# Patient Record
Sex: Female | Born: 1980
Health system: Southern US, Community
[De-identification: ages and names within clinical notes are randomized; demographics above are authoritative.]

## PROBLEM LIST (undated history)

## (undated) ENCOUNTER — Inpatient Hospital Stay (HOSPITAL_COMMUNITY): Payer: Self-pay

## (undated) DIAGNOSIS — K219 Gastro-esophageal reflux disease without esophagitis: Secondary | ICD-10-CM

## (undated) DIAGNOSIS — T8859XA Other complications of anesthesia, initial encounter: Secondary | ICD-10-CM

## (undated) DIAGNOSIS — Z87440 Personal history of urinary (tract) infections: Secondary | ICD-10-CM

## (undated) DIAGNOSIS — T4145XA Adverse effect of unspecified anesthetic, initial encounter: Secondary | ICD-10-CM

## (undated) DIAGNOSIS — I10 Essential (primary) hypertension: Secondary | ICD-10-CM

## (undated) DIAGNOSIS — Z8759 Personal history of other complications of pregnancy, childbirth and the puerperium: Secondary | ICD-10-CM

## (undated) DIAGNOSIS — K649 Unspecified hemorrhoids: Secondary | ICD-10-CM

## (undated) HISTORY — DX: Essential (primary) hypertension: I10

## (undated) HISTORY — DX: Unspecified hemorrhoids: K64.9

## (undated) HISTORY — DX: Personal history of urinary (tract) infections: Z87.440

## (undated) HISTORY — DX: Personal history of other complications of pregnancy, childbirth and the puerperium: Z87.59

---

## 2009-08-25 ENCOUNTER — Emergency Department (HOSPITAL_COMMUNITY): Admission: EM | Admit: 2009-08-25 | Discharge: 2009-08-25 | Payer: Self-pay | Admitting: Emergency Medicine

## 2010-05-30 ENCOUNTER — Emergency Department (HOSPITAL_COMMUNITY)
Admission: EM | Admit: 2010-05-30 | Discharge: 2010-05-31 | Payer: Self-pay | Source: Home / Self Care | Admitting: Emergency Medicine

## 2010-10-17 ENCOUNTER — Other Ambulatory Visit (HOSPITAL_COMMUNITY): Payer: Self-pay | Admitting: Obstetrics & Gynecology

## 2010-10-17 DIAGNOSIS — N979 Female infertility, unspecified: Secondary | ICD-10-CM

## 2010-10-24 ENCOUNTER — Ambulatory Visit (HOSPITAL_COMMUNITY)
Admission: RE | Admit: 2010-10-24 | Discharge: 2010-10-24 | Disposition: A | Discharge status: Home / Self Care | Payer: PRIVATE HEALTH INSURANCE | Source: Ambulatory Visit | Attending: Obstetrics & Gynecology | Admitting: Obstetrics & Gynecology

## 2010-10-24 DIAGNOSIS — N979 Female infertility, unspecified: Secondary | ICD-10-CM | POA: Insufficient documentation

## 2010-11-09 LAB — CBC
MCH: 28.2 pg (ref 26.0–34.0)
MCHC: 33.2 g/dL (ref 30.0–36.0)
Platelets: 325 10*3/uL (ref 150–400)
RBC: 4.57 MIL/uL (ref 3.87–5.11)

## 2010-11-09 LAB — DIFFERENTIAL
Basophils Relative: 0 % (ref 0–1)
Eosinophils Absolute: 0.2 10*3/uL (ref 0.0–0.7)
Lymphs Abs: 4.6 10*3/uL — ABNORMAL HIGH (ref 0.7–4.0)
Neutro Abs: 6.4 10*3/uL (ref 1.7–7.7)
Neutrophils Relative %: 54 % (ref 43–77)

## 2010-11-09 LAB — POCT I-STAT, CHEM 8
Chloride: 105 mEq/L (ref 96–112)
HCT: 42 % (ref 36.0–46.0)
Hemoglobin: 14.3 g/dL (ref 12.0–15.0)
Potassium: 4.1 mEq/L (ref 3.5–5.1)
Sodium: 138 mEq/L (ref 135–145)

## 2010-11-09 LAB — POCT PREGNANCY, URINE: Preg Test, Ur: NEGATIVE

## 2010-11-27 LAB — POCT I-STAT, CHEM 8
HCT: 39 % (ref 36.0–46.0)
Hemoglobin: 13.3 g/dL (ref 12.0–15.0)
Potassium: 4 mEq/L (ref 3.5–5.1)
Sodium: 137 mEq/L (ref 135–145)

## 2011-05-30 ENCOUNTER — Emergency Department (HOSPITAL_COMMUNITY)
Admission: EM | Admit: 2011-05-30 | Discharge: 2011-05-30 | Discharge status: Against Medical Advice | Payer: PRIVATE HEALTH INSURANCE | Attending: Emergency Medicine | Admitting: Emergency Medicine

## 2011-05-30 DIAGNOSIS — O209 Hemorrhage in early pregnancy, unspecified: Secondary | ICD-10-CM | POA: Insufficient documentation

## 2011-05-30 DIAGNOSIS — R109 Unspecified abdominal pain: Secondary | ICD-10-CM | POA: Insufficient documentation

## 2011-06-06 ENCOUNTER — Inpatient Hospital Stay (HOSPITAL_COMMUNITY)
Admission: AD | Admit: 2011-06-06 | Discharge: 2011-06-06 | Disposition: A | Discharge status: Home / Self Care | Payer: PRIVATE HEALTH INSURANCE | Source: Ambulatory Visit | Attending: Family Medicine | Admitting: Family Medicine

## 2011-06-06 ENCOUNTER — Inpatient Hospital Stay (HOSPITAL_COMMUNITY): Payer: PRIVATE HEALTH INSURANCE

## 2011-06-06 ENCOUNTER — Encounter (HOSPITAL_COMMUNITY): Payer: Self-pay

## 2011-06-06 DIAGNOSIS — O209 Hemorrhage in early pregnancy, unspecified: Secondary | ICD-10-CM | POA: Insufficient documentation

## 2011-06-06 DIAGNOSIS — O26859 Spotting complicating pregnancy, unspecified trimester: Secondary | ICD-10-CM

## 2011-06-06 LAB — URINALYSIS, ROUTINE W REFLEX MICROSCOPIC
Bilirubin Urine: NEGATIVE
Leukocytes, UA: NEGATIVE
Nitrite: NEGATIVE
Specific Gravity, Urine: 1.015 (ref 1.005–1.030)
Urobilinogen, UA: 0.2 mg/dL (ref 0.0–1.0)

## 2011-06-06 LAB — CBC
MCH: 27.2 pg (ref 26.0–34.0)
Platelets: 289 10*3/uL (ref 150–400)
RBC: 4.34 MIL/uL (ref 3.87–5.11)
WBC: 10.2 10*3/uL (ref 4.0–10.5)

## 2011-06-06 LAB — URINE MICROSCOPIC-ADD ON: WBC, UA: NONE SEEN WBC/hpf (ref <3)

## 2011-06-06 LAB — ABO/RH: ABO/RH(D): O POS

## 2011-06-06 LAB — WET PREP, GENITAL: Trich, Wet Prep: NONE SEEN

## 2011-06-06 NOTE — Progress Notes (Signed)
RN called pt back for evaluation. Pt not in lobby at that time.

## 2011-06-06 NOTE — ED Provider Notes (Signed)
Colleen Terrell [id number] y.o.G2P1001 @[redacted]w[redacted]d  Chief Complaint  Patient presents with  . Vaginal Bleeding    SUBJECTIVE  HPI: Vaginal spotting intermittently x 10 days. Today passed small clots several hours prior to arrival. No active bleeding at present. Mild groin pain when walking. Denies urinary symptoms or irritative vaginitis and states bleeding was not postcoital.  No past medical history on file.  Ob Hx: uncomplicated NSVD Gyn Hx: neg STIs  No past surgical history on file. History   Social History  . Marital Status: Married    Spouse Name: N/A    Number of Children: N/A  . Years of Education: N/A   Occupational History  . Not on file.   Social History Main Topics  . Smoking status: Never Smoker   . Smokeless tobacco: Not on file  . Alcohol Use: No  . Drug Use: No  . Sexually Active:    Other Topics Concern  . Not on file   Social History Narrative  . No narrative on file   No current facility-administered medications on file prior to encounter.   No current outpatient prescriptions on file prior to encounter.   No Known Allergies  ROS: Pertinent items in HPI  OBJECTIVE  BP 126/71  Pulse 89  Temp(Src) 99.3 F (37.4 C) (Oral)  Resp 20  Ht 5' 9.5" (1.765 m)  Wt 112.946 kg (249 lb)  BMI 36.24 kg/m2  SpO2 97%  LMP 04/20/2011   Physical Exam:  General: WN/WD in NAD XBJ:YNWG, nontender Pelvic: NEFG; vag with scant brown blood, no active bleeding from os; Cx no lesions;  Bimanual: fishmouth os, ? Old lac, cx L/C, uterus c/w 7 wks; adnexae no mass Back:neg CVAT Ext: No edema US Ob Comp Less 14 Wks  06/06/2011  *RADIOLOGY REPORT*  Clinical Data: Early pregnancy with bleeding  OBSTETRIC <14 WK [place] AND TRANSVAGINAL OB US  Technique:  Both transabdominal and transvaginal ultrasound examinations were performed for complete evaluation of the gestation as well as the maternal uterus, adnexal regions, and pelvic cul-de-sac.  Transvaginal technique was  performed to assess early pregnancy.  Comparison:  None  Intrauterine gestational sac:  Single sac present, normal in shape. Yolk sac: Present Embryo: Present Cardiac Activity: Present Heart Rate: 125 bpm  CRL: 10   mm  seven   w  one   d          [place] EDC: 01/25/2012  Maternal uterus/adnexae: No uterine abnormality is seen.  Both ovaries appear normal with the right measuring 2.9 by 3.0 x 1.6 cm and the left measuring 1.6 x 3.3 x 1.8 cm.  No free fluid.  IMPRESSION: Normal appearing single living intrauterine gestation at 7 weeks 1 day by crown-rump length.  No complicating feature evident.  Original Report Authenticated By: Thomasenia Sales, M.D.   Lab: WP neg, GC/CT done  ASSESSMENT  Early pregnancy bleeding Viable IUP [redacted]w[redacted]d   PLAN  Start PNV, [facility]

## 2011-06-06 NOTE — Progress Notes (Signed)
Pt states that she has some spotting on and off for about 10 days. Passed a small clot at 1500, no active bleeding. Has pain on both her sides when standing.

## 2011-06-07 LAB — GC/CHLAMYDIA PROBE AMP, GENITAL
Chlamydia, DNA Probe: NEGATIVE
GC Probe Amp, Genital: NEGATIVE

## 2011-08-01 LAB — OB RESULTS CONSOLE ANTIBODY SCREEN: Antibody Screen: NEGATIVE

## 2011-08-01 LAB — OB RESULTS CONSOLE ABO/RH

## 2011-08-01 LAB — OB RESULTS CONSOLE HEPATITIS B SURFACE ANTIGEN: Hepatitis B Surface Ag: NEGATIVE

## 2011-08-01 LAB — OB RESULTS CONSOLE RUBELLA ANTIBODY, IGM: Rubella: IMMUNE

## 2011-08-28 NOTE — L&D Delivery Note (Signed)
Delivery Note At 6:33 AM a viable and healthy female was delivered via Vaginal, Spontaneous Delivery (Presentation: Left Occiput Anterior).  APGAR: 8, 8; weight 9 lb 1.5 oz (4125 g).   Placenta status: Intact, Spontaneous.  Sent , marginal insertion Cord: 3 vessels with the following complications: None.  Cord pH: none  Anesthesia: Local  Episiotomy: MedianI( through previous scar due to left labia minora tearing Lacerations: 3rd degree;Labial left minora( entire) medial Suture Repair: 2.0 3.0 chromic vicryl vicryl rapide Est. Blood Loss (mL): 350  Mom to postpartum.  Baby to nursery-stable.  Lakin Romer A 02/02/2012, 8:50 AM

## 2012-01-31 ENCOUNTER — Other Ambulatory Visit: Payer: Self-pay | Admitting: Obstetrics & Gynecology

## 2012-02-01 ENCOUNTER — Encounter (HOSPITAL_COMMUNITY): Payer: Self-pay | Admitting: *Deleted

## 2012-02-01 ENCOUNTER — Telehealth (HOSPITAL_COMMUNITY): Payer: Self-pay | Admitting: *Deleted

## 2012-02-01 NOTE — Telephone Encounter (Signed)
Preadmission screen  

## 2012-02-02 ENCOUNTER — Encounter (HOSPITAL_COMMUNITY): Payer: Self-pay

## 2012-02-02 ENCOUNTER — Inpatient Hospital Stay (HOSPITAL_COMMUNITY)
Admission: AD | Admit: 2012-02-02 | Discharge: 2012-02-04 | DRG: 775 | Disposition: A | Discharge status: Home / Self Care | Payer: PRIVATE HEALTH INSURANCE | Source: Ambulatory Visit | Attending: Obstetrics and Gynecology | Admitting: Obstetrics and Gynecology

## 2012-02-02 DIAGNOSIS — O878 Other venous complications in the puerperium: Secondary | ICD-10-CM | POA: Diagnosis present

## 2012-02-02 DIAGNOSIS — Z23 Encounter for immunization: Secondary | ICD-10-CM

## 2012-02-02 DIAGNOSIS — O480 Post-term pregnancy: Secondary | ICD-10-CM | POA: Diagnosis present

## 2012-02-02 DIAGNOSIS — Z22330 Carrier of Group B streptococcus: Secondary | ICD-10-CM

## 2012-02-02 DIAGNOSIS — K649 Unspecified hemorrhoids: Secondary | ICD-10-CM | POA: Diagnosis present

## 2012-02-02 DIAGNOSIS — O99892 Other specified diseases and conditions complicating childbirth: Secondary | ICD-10-CM | POA: Diagnosis present

## 2012-02-02 LAB — CBC
HCT: 39.4 % (ref 36.0–46.0)
Hemoglobin: 13.1 g/dL (ref 12.0–15.0)
MCHC: 33.2 g/dL (ref 30.0–36.0)
MCV: 87.8 fL (ref 78.0–100.0)
RDW: 13.9 % (ref 11.5–15.5)

## 2012-02-02 MED ORDER — LANOLIN HYDROUS EX OINT: TOPICAL_OINTMENT | CUTANEOUS | Status: DC | PRN

## 2012-02-02 MED ORDER — LACTATED RINGERS IV SOLN
INTRAVENOUS | Status: DC
  Administered 2012-02-02: 05:00:00 via INTRAVENOUS

## 2012-02-02 MED ORDER — ONDANSETRON HCL 4 MG/2ML IJ SOLN: 4.0000 mg | INTRAMUSCULAR | Status: DC | PRN

## 2012-02-02 MED ORDER — PRENATAL MULTIVITAMIN CH: 1.0000 | ORAL_TABLET | Freq: Every day | ORAL | Status: DC

## 2012-02-02 MED ORDER — ONDANSETRON HCL 4 MG/2ML IJ SOLN: 4.0000 mg | Freq: Four times a day (QID) | INTRAMUSCULAR | Status: DC | PRN

## 2012-02-02 MED ORDER — TETANUS-DIPHTH-ACELL PERTUSSIS 5-2.5-18.5 LF-MCG/0.5 IM SUSP
0.5000 mL | Freq: Once | INTRAMUSCULAR | Status: AC
  Administered 2012-02-03: 0.5 mL via INTRAMUSCULAR
  Filled 2012-02-02: qty 0.5

## 2012-02-02 MED ORDER — NYSTATIN-TRIAMCINOLONE 100000-0.1 UNIT/GM-% EX CREA
TOPICAL_CREAM | Freq: Two times a day (BID) | CUTANEOUS | Status: DC
  Administered 2012-02-02: 1 via TOPICAL
  Administered 2012-02-02 – 2012-02-04 (×4): via TOPICAL
  Filled 2012-02-02: qty 15

## 2012-02-02 MED ORDER — DOCUSATE SODIUM 100 MG PO CAPS
100.0000 mg | ORAL_CAPSULE | Freq: Two times a day (BID) | ORAL | Status: DC
  Administered 2012-02-02 – 2012-02-03 (×4): 100 mg via ORAL
  Filled 2012-02-02 (×5): qty 1

## 2012-02-02 MED ORDER — CITRIC ACID-SODIUM CITRATE 334-500 MG/5ML PO SOLN: 30.0000 mL | ORAL | Status: DC | PRN

## 2012-02-02 MED ORDER — ONDANSETRON HCL 4 MG PO TABS: 4.0000 mg | ORAL_TABLET | ORAL | Status: DC | PRN

## 2012-02-02 MED ORDER — WITCH HAZEL-GLYCERIN EX PADS
1.0000 "application " | MEDICATED_PAD | CUTANEOUS | Status: DC | PRN
  Administered 2012-02-02: 1 via TOPICAL

## 2012-02-02 MED ORDER — OXYTOCIN BOLUS FROM INFUSION
500.0000 mL | Freq: Once | INTRAVENOUS | Status: AC
  Administered 2012-02-02: 500 mL via INTRAVENOUS
  Filled 2012-02-02: qty 500
  Filled 2012-02-02: qty 1000

## 2012-02-02 MED ORDER — OXYCODONE-ACETAMINOPHEN 5-325 MG PO TABS
1.0000 | ORAL_TABLET | ORAL | Status: DC | PRN
  Administered 2012-02-02 – 2012-02-04 (×6): 1 via ORAL
  Filled 2012-02-02 (×6): qty 1

## 2012-02-02 MED ORDER — BUTORPHANOL TARTRATE 2 MG/ML IJ SOLN
INTRAMUSCULAR | Status: AC
  Filled 2012-02-02: qty 1

## 2012-02-02 MED ORDER — DIPHENHYDRAMINE HCL 25 MG PO CAPS: 25.0000 mg | ORAL_CAPSULE | Freq: Four times a day (QID) | ORAL | Status: DC | PRN

## 2012-02-02 MED ORDER — ZOLPIDEM TARTRATE 5 MG PO TABS: 5.0000 mg | ORAL_TABLET | Freq: Every evening | ORAL | Status: DC | PRN

## 2012-02-02 MED ORDER — LACTATED RINGERS IV SOLN: 500.0000 mL | INTRAVENOUS | Status: DC | PRN

## 2012-02-02 MED ORDER — IBUPROFEN 600 MG PO TABS
600.0000 mg | ORAL_TABLET | Freq: Four times a day (QID) | ORAL | Status: DC | PRN
  Administered 2012-02-02: 600 mg via ORAL
  Filled 2012-02-02: qty 1

## 2012-02-02 MED ORDER — ACETAMINOPHEN 325 MG PO TABS: 650.0000 mg | ORAL_TABLET | ORAL | Status: DC | PRN

## 2012-02-02 MED ORDER — PRENATAL MULTIVITAMIN CH
1.0000 | ORAL_TABLET | Freq: Every day | ORAL | Status: DC
  Administered 2012-02-02 – 2012-02-04 (×3): 1 via ORAL
  Filled 2012-02-02 (×2): qty 1

## 2012-02-02 MED ORDER — SIMETHICONE 80 MG PO CHEW: 80.0000 mg | CHEWABLE_TABLET | ORAL | Status: DC | PRN

## 2012-02-02 MED ORDER — LIDOCAINE HCL (PF) 1 % IJ SOLN
30.0000 mL | INTRAMUSCULAR | Status: DC | PRN
  Administered 2012-02-02: 30 mL via SUBCUTANEOUS
  Filled 2012-02-02: qty 30

## 2012-02-02 MED ORDER — OXYCODONE-ACETAMINOPHEN 5-325 MG PO TABS
1.0000 | ORAL_TABLET | ORAL | Status: DC | PRN
  Administered 2012-02-02: 1 via ORAL
  Filled 2012-02-02: qty 1

## 2012-02-02 MED ORDER — BENZOCAINE-MENTHOL 20-0.5 % EX AERO
INHALATION_SPRAY | CUTANEOUS | Status: AC
  Administered 2012-02-03: 1 via TOPICAL
  Filled 2012-02-02: qty 56

## 2012-02-02 MED ORDER — SENNOSIDES-DOCUSATE SODIUM 8.6-50 MG PO TABS
2.0000 | ORAL_TABLET | Freq: Every day | ORAL | Status: DC
  Administered 2012-02-02 – 2012-02-03 (×2): 2 via ORAL

## 2012-02-02 MED ORDER — FLEET ENEMA 7-19 GM/118ML RE ENEM: 1.0000 | ENEMA | RECTAL | Status: DC | PRN

## 2012-02-02 MED ORDER — OXYTOCIN 20 UNITS IN LACTATED RINGERS INFUSION - SIMPLE: 125.0000 mL/h | Freq: Once | INTRAVENOUS | Status: DC

## 2012-02-02 MED ORDER — MINERAL OIL PO OIL
TOPICAL_OIL | Freq: Every morning | ORAL | Status: DC
  Administered 2012-02-02 – 2012-02-04 (×3): 30 mL via ORAL
  Filled 2012-02-02 (×5): qty 30

## 2012-02-02 MED ORDER — DIBUCAINE 1 % RE OINT
1.0000 "application " | TOPICAL_OINTMENT | RECTAL | Status: DC | PRN
  Administered 2012-02-02: 1 via RECTAL

## 2012-02-02 MED ORDER — BENZOCAINE-MENTHOL 20-0.5 % EX AERO
1.0000 "application " | INHALATION_SPRAY | CUTANEOUS | Status: DC | PRN
  Administered 2012-02-02 – 2012-02-04 (×3): 1 via TOPICAL
  Filled 2012-02-02 (×2): qty 56

## 2012-02-02 MED ORDER — IBUPROFEN 600 MG PO TABS
600.0000 mg | ORAL_TABLET | Freq: Four times a day (QID) | ORAL | Status: DC
  Administered 2012-02-02 – 2012-02-04 (×9): 600 mg via ORAL
  Filled 2012-02-02 (×9): qty 1

## 2012-02-02 MED ORDER — SODIUM CHLORIDE 0.9 % IV SOLN
2.0000 g | Freq: Once | INTRAVENOUS | Status: AC
  Administered 2012-02-02: 2 g via INTRAVENOUS
  Filled 2012-02-02: qty 2000

## 2012-02-02 NOTE — Progress Notes (Signed)
INTERVAL NOTE:  S:  Sitting in bed, breast fed well, min cramping, (+) voids, small bleed, denies HA/NV/dizziness  O:  VSS, AAO x 3, NAD  FF bellow U  Moderate lochia with vigorous fundal check, no clots  A / P:   PPD #0  Stable post partum  Routine PP orders  Kaelynne Christley, CNM, MSN  02/02/2012 11:36 AM

## 2012-02-02 NOTE — H&P (Signed)
Colleen Terrell is a 31 y.o. female presenting @ 61 1/[redacted] weeks gestation in active labor. Pt was 7 cm dilated/bulging membrane. (+) GBS cx History OB History    Grav Para Term Preterm Abortions TAB SAB Ect Mult Living   2 2 2       2      Past Medical History  Diagnosis Date  . Unspecified hemorrhoids without mention of complication   . History of kidney infection during pregnancy    History reviewed. No pertinent past surgical history. Family History: family history includes Hypertension in her father.  There is no history of Anesthesia problems. Social History:  reports that she has never smoked. She has never used smokeless tobacco. She reports that she does not drink alcohol or use illicit drugs.  ROS neg  Dilation: 10 Effacement (%): 100 Station: Crowning Exam by:: dr Chrishaun Sasso Blood pressure 160/88, pulse 86, temperature 98.4 F (36.9 C), temperature source Oral, resp. rate 20, height 5' 9.5" (1.765 m), weight 119.296 kg (263 lb), last menstrual period 04/20/2011, unknown if currently breastfeeding. Maternal Exam:  Uterine Assessment: Contraction strength is firm.  Contraction frequency is regular.   Abdomen: Patient reports no abdominal tenderness. Estimated fetal weight is 9 lb.   Fetal presentation: vertex  Introitus: Ferning test: not done.  Nitrazine test: not done. Amniotic fluid character: not assessed.  Pelvis: adequate for delivery.   Cervix: Cervix evaluated by digital exam.     Physical Exam  Constitutional: She is oriented to person, place, and time. She appears well-developed and well-nourished.  HENT:  Head: Atraumatic.  Eyes: EOM are normal.  Neck: Neck supple.  Cardiovascular: Regular rhythm.   Respiratory: Breath sounds normal.  GI: Bowel sounds are normal.  Musculoskeletal: Normal range of motion. She exhibits edema.  Neurological: She is alert and oriented to person, place, and time.  Skin: Skin is warm and dry.  Psychiatric: She has a  normal mood and affect.    Prenatal labs: ABO, Rh: O/Positive/-- (12/05 0000) Antibody: Negative (12/05 0000) Rubella: Immune (12/05 0000) RPR: Nonreactive (03/11 0000)  HBsAg: Negative (12/05 0000)  HIV: Non-reactive (03/11 0000)  GBS: Positive (05/07 0000)   Assessment/Plan: Active labor Postdates GBS cx (+) P) admit. Routine labs. Ampicillin IV. Declines epidural  Dylan Ruotolo A 02/02/2012, 8:38 AM

## 2012-02-03 LAB — CBC
MCV: 88.5 fL (ref 78.0–100.0)
Platelets: 202 10*3/uL (ref 150–400)
RBC: 3.92 MIL/uL (ref 3.87–5.11)
RDW: 14.2 % (ref 11.5–15.5)
WBC: 13.5 10*3/uL — ABNORMAL HIGH (ref 4.0–10.5)

## 2012-02-03 MED ORDER — HYDROCORTISONE ACE-PRAMOXINE 1-1 % RE FOAM
1.0000 | Freq: Three times a day (TID) | RECTAL | Status: DC
  Administered 2012-02-03 – 2012-02-04 (×4): 1 via RECTAL
  Filled 2012-02-03: qty 10

## 2012-02-03 MED ORDER — HYDROCORTISONE ACE-PRAMOXINE 1-1 % RE CREA: TOPICAL_CREAM | Freq: Three times a day (TID) | RECTAL | Status: DC

## 2012-02-03 MED ORDER — DOCUSATE SODIUM 100 MG PO CAPS
100.0000 mg | ORAL_CAPSULE | Freq: Every day | ORAL | Status: DC
  Administered 2012-02-04: 100 mg via ORAL

## 2012-02-03 NOTE — Progress Notes (Addendum)
PPD 1 SVD  S:  Reports feeling well, sore bottom.             Tolerating po/ No nausea or vomiting             Bleeding is moderate             Pain controlled with Motrin.             Up ad lib / ambulatory / voiding well, no BM yet.   Newborn  Information for the patient's newborn:  Dereck Leep, Girl Aayra [[id number]]  female  breast feeding     O:  A & O x 3 NAD             VS:  Filed Vitals:   02/02/12 0953 02/02/12 1346 02/02/12 2245 02/03/12 0635  BP: 126/70 113/75 118/73 132/87  Pulse: 78 85 74 82  Temp: 98.3 F (36.8 C) 98.5 F (36.9 C) 98.3 F (36.8 C) 98.1 F (36.7 C)  TempSrc: Oral Oral Oral Oral  Resp: 20 20 20 20   Height:      Weight:        LABS:  Basename 02/03/12 0517 02/02/12 0450  WBC 13.5* 10.0  HGB 11.3* 13.1  HCT 34.7* 39.4  PLT 202 214    Blood type: O/Positive/-- (12/05 0000)  Rubella: Immune (12/05 0000)   I&O: I/O last 3 completed shifts: In: -  Out: 350 [Blood:350]      Lungs: Clear and unlabored  Heart: regular rate and rhythm / no murmurs  Abdomen: soft, non-tender, non-distended              Fundus: firm, non-tender, U -1  Perineum: repair intact, + external hemorrhoid pink and swollen, no thrombosis  Lochia: moderate  Extremities: no edema, no calf pain or tenderness, neg Homans    A/P: PPD # 1 30 y.o., [id number]    Principal Problem:  *PP NVB 02/02/12 Hemorrhoids inflamed  Start Proctocream HC and stool softener, bowel regimen reviewed  Doing well - stable status  Routine post partum orders  Anticipate discharge home in AM.   Roshanna Cimino, CNM, MSN 02/03/2012, 9:52 AM

## 2012-02-04 MED ORDER — DSS 100 MG PO CAPS: 100.0000 mg | ORAL_CAPSULE | Freq: Two times a day (BID) | ORAL | Status: AC

## 2012-02-04 MED ORDER — HYDROCORTISONE ACE-PRAMOXINE 1-1 % RE FOAM: 1.0000 | Freq: Three times a day (TID) | RECTAL | Status: AC

## 2012-02-04 MED ORDER — NYSTATIN-TRIAMCINOLONE 100000-0.1 UNIT/GM-% EX CREA: TOPICAL_CREAM | Freq: Two times a day (BID) | CUTANEOUS | Status: AC

## 2012-02-04 MED ORDER — MINERAL OIL PO OIL: TOPICAL_OIL | Freq: Every morning | ORAL | Status: AC

## 2012-02-04 MED ORDER — IBUPROFEN 600 MG PO TABS: 600.0000 mg | ORAL_TABLET | Freq: Four times a day (QID) | ORAL | Status: AC

## 2012-02-04 MED ORDER — OXYCODONE-ACETAMINOPHEN 5-325 MG PO TABS: 1.0000 | ORAL_TABLET | ORAL | Status: AC | PRN

## 2012-02-04 MED ORDER — BREAST PUMP MISC: 1.0000 [IU] | Status: DC | PRN

## 2012-02-04 NOTE — Progress Notes (Signed)
Post Partum Day #2            Information for the patient's newborn:  Dereck Leep, Girl Edye [[id number]]  female   Feeding: breast, no difficulties  Subjective: No HA, SOB, CP, F/C, breast symptoms. Rectal pain improved after Proctofoam HC. Normal vaginal bleeding, no clots.      Objective:  Temp:  [98.2 F (36.8 C)-98.3 F (36.8 C)] 98.2 F (36.8 C) (06/10 0640) Pulse Rate:  [78-80] 78  (06/10 0640) Resp:  [18-20] 18  (06/10 0640) BP: (126-139)/(80-89) 139/89 mmHg (06/10 0640)  No intake or output data in the 24 hours ending 02/04/12 1012     Basename 02/03/12 0517 02/02/12 0450  WBC 13.5* 10.0  HGB 11.3* 13.1  HCT 34.7* 39.4  PLT 202 214    Blood type: O/Positive/-- (12/05 0000) Rubella: Immune (12/05 0000)    Physical Exam:  General: alert, cooperative and no distress Uterine Fundus: firm Lochia: appropriate Perineum: repair intact, edema none, + hemorrhoid / non-thrombosed DVT Evaluation: Negative Homan's sign. No significant calf/ankle edema.    Assessment/Plan: PPD # 2 / 31 y.o., [id number] S/P:spontaneous vaginal   Principal Problem:  *PP NVB 02/02/12 3rd deg lac and hemorrhoids Continue bowel regimen 4-6 wks PP   normal postpartum exam  Continue current postpartum care  D/C home   LOS: 2 days   Dalma Panchal, CNM, MSN 02/04/2012, 10:12 AM

## 2012-02-04 NOTE — Discharge Instructions (Signed)
Breast Pumping Tips Pumping your breast milk is a good way to stimulate milk production and have a steady supply of breast milk for your infant. Pumping is most helpful during your infant's growth spurts, when involving dad or a family member, or when you are away. There are several types of pumps available. They can be purchased at a baby or maternity store. You can begin pumping soon after delivery, but some experts believe that you should wait about four weeks to give your infant a bottle. In general, the more you breastfeed or pump, the more milk you will have for your infant. It is also important to take good care of yourself. This will reduce stress and help your body to create a healthy supply of milk. Your caregiver or lactation consultant can give you the information and support you need in your efforts to breastfeed your infant. PUMPING BREAST MILK  Follow the tips below for successful breast pumping. Take care of yourself.  Drink enough water or fluids to keep urine clear or pale yellow. You may notice a thirsty feeling while breastfeeding. This is because your body needs more water to make breast milk. Keep a large water bottle handy. Make healthy drink choices such as unsweetened fruit juice, milk and water. Limit soda, coffee, and alcohol (wait 2 hours to feed or pump if you have an alcoholic drink.)   Eat a healthy, well-balanced diet rich in fruits, vegetables, and whole grains.   Exercise as recommended by your caregiver.   Get plenty of sleep. Sleep when your infant sleeps. Ask friends and family for help if you need time to nap or rest.   Do not smoke. Smoking can lower your milk supply and harm your infant. If you need help quitting, ask your caregiver for a program recommendation.   Ask your caregiver about birth control options. Birth control pills may lower your milk supply. You may be advised to use condoms or other forms of birth control.  Relax and pump Stimulating your  let-down reflex is the key to successful and effective pumping. This makes the milk in all parts of the breast flow more freely.   It is easier to pump breast milk (and breastfeed) while you are relaxed. Find techniques that work for you. Quiet private spaces, breast massage, soothing heat placed on the breast, music, and pictures or a tape recording of your infant may help you to relax and "let down" your milk. If you have difficulty with your let down, try smelling one of your infant's blankets or an item of clothing he or she has worn while you are pumping.   When pumping, place the special suction cup (flange) directly over the nipple. It may be uncomfortable and cause nipple damage if it is not placed properly or is the wrong size. Applying a small amount of purified or modified lanolin to your nipple and the areola may help increase your comfort level. Also, you can change the speed and suction of many electric pumps to your comfort level. Your caregiver or lactation consultant can help you with this.   If pumping continues to be painful, or you feel you are not getting very much milk when you pump, you may need a different type of pump. A lactation consultant can help you determine if this is the case.   If you are with your infant, feed him or her on demand and try pumping after each feeding. This will boost your production, even if milk   does not come out. You may not be able to pump much milk at first, but keep up the routine, and this will change.   If you are working or away from your infant for several hours, try pumping for about 15 minutes every 2 to 3 hours. Pump both breasts at the same time if you can.   If your infant has a formula feeding, make sure you pump your milk around the same time to maintain your supply.   Begin pumping breast milk a few weeks before you return to work. This will help you develop techniques that work for you and will be able to store extra milk.   Find a  source of breastfeeding information that works well for you.  TIPS FOR STORING BREAST MILK  Store breast milk in a sealable sterile bag, jar, or container provided with your pumping supplies.   Store milk in small amounts close to what your infant is drinking at each feeding.   Cool pumped milk in a refrigerator or cooler. Pumped milk can last at the back of the refrigerator for 3 to 8 days.   Place cooled milk at the back of the freezer for up to 3 months.   Thaw the milk in its container or bag in warm water up to 24 hours in advance. Do not use a microwave to thaw or heat milk. Do not refreeze the milk after it has been thawed.   Breast milk is safe to drink when left at room temperature (mid 70s or colder) for 4 to 8 hours. After that, throw it away.   Milk fat can separate and look funny. The color can vary slightly from day to day. This is normal. Always shake the milk before using it to mix the fat with the more watery portion.  SEEK MEDICAL CARE IF:   You are having trouble pumping or feeding your infant.   You are concerned that you are not making enough milk.   You have nipple pain, soreness, or redness.   You have other questions or concerns related to you or your infant.  Document Released: 01/31/2010 Document Revised: 08/02/2011 Document Reviewed: 01/31/2010 Exit[employer] Patient Information 2012 [employer].Postpartum Depression and Baby Blues The postpartum period begins right after the birth of a baby. During this time, there is often a great amount of joy and excitement. It is also a time of considerable changes in the life of the parent(s). Regardless of how many times a mother gives birth, each child brings new challenges and dynamics to the family. It is not unusual to have feelings of excitement accompanied by confusing shifts in moods, emotions, and thoughts. All mothers are at risk of developing postpartum depression or the "baby blues." These mood changes can occur  right after giving birth, or they may occur many months after giving birth. The baby blues or postpartum depression can be mild or severe. Additionally, postpartum depression can resolve rather quickly, or it can be a long-term condition. CAUSES Elevated hormones and their rapid decline are thought to be a main cause of postpartum depression and the baby blues. There are a number of hormones that radically change during and after pregnancy. Estrogen and progesterone usually decrease immediately after delivering your baby. The level of thyroid hormone and various cortisol steroids also rapidly drop. Other factors that play a major role in these changes include major life events and genetics.  RISK FACTORS If you have any of the following risks for the   baby blues or postpartum depression, know what symptoms to watch out for during the postpartum period. Risk factors that may increase the likelihood of getting the baby blues or postpartum depression include:  Havinga personal or family history of depression.   Having depression while being pregnant.   Having premenstrual or oral contraceptive-associated mood issues.   Having exceptional life stress.   Having marital conflict.   Lacking a social support network.   Having a baby with special needs.   Having health problems such as diabetes.  SYMPTOMS Baby blues symptoms include:  Brief fluctuations in mood, such as going from extreme happiness to sadness.   Decreased concentration.   Difficulty sleeping.   Crying spells, tearfulness.   Irritability.   Anxiety.  Postpartum depression symptoms typically begin within the first month after giving birth. These symptoms include:  Difficulty sleeping or excessive sleepiness.   Marked weight loss.   Agitation.   Feelings of worthlessness.   Lack of interest in activity or food.  Postpartum psychosis is a very concerning condition and can be dangerous. Fortunately, it is rare.  Displaying any of the following symptoms is cause for immediate medical attention. Postpartum psychosis symptoms include:  Hallucinations and delusions.   Bizarre or disorganized behavior.   Confusion or disorientation.  DIAGNOSIS  A diagnosis is made by an evaluation of your symptoms. There are no medical or lab tests that lead to a diagnosis, but there are various questionnaires that a caregiver may use to identify those with the baby blues, postpartum depression, or psychosis. Often times, a screening tool called the Edinburgh Postnatal Depression Scale is used to diagnose depression in the postpartum period.  TREATMENT The baby blues usually goes away on its own in 1 to 2 weeks. Social support is often all that is needed. You should be encouraged to get adequate sleep and rest. Occasionally, you may be given medicines to help you sleep.  Postpartum depression requires treatment as it can last several months or longer if it is not treated. Treatment may include individual or group therapy, medicine, or both to address any social, physiological, and psychological factors that may play a role in the depression. Regular exercise, a healthy diet, rest, and social support may also be strongly recommended.  Postpartum psychosis is more serious and needs treatment right away. Hospitalization is often needed. HOME CARE INSTRUCTIONS  Get as much rest as you can. Nap when the baby sleeps.   Exercise regularly. Some women find yoga and walking to be beneficial.   Eat a balanced and nourishing diet.   Do little things that you enjoy. Have a cup of tea, take a bubble bath, read your favorite magazine, or listen to your favorite music.   Avoid alcohol.   Ask for help with household chores, cooking, grocery shopping, or running errands as needed. Do not try to do everything.   Talk to people close to you about how you are feeling. Get support from your partner, family members, friends, or other new  moms.   Try to stay positive in how you think. Think about the things you are grateful for.   Do not spend a lot of time alone.   Only take medicine as directed by your caregiver.   Keep all your postpartum appointments.   Let your caregiver know if you have any concerns.  SEEK MEDICAL CARE IF: You are having a reaction or problems with your medicine. SEEK IMMEDIATE MEDICAL CARE IF:  You have suicidal feelings.     You feel you may harm the baby or someone else.  Document Released: 05/17/2004 Document Revised: 08/02/2011 Document Reviewed: 06/19/2011 [employer] Patient Information 2012 [employer]. 

## 2012-02-04 NOTE — Discharge Summary (Signed)
Obstetric Discharge Summary Reason for Admission: onset of labor Prenatal Procedures: ultrasound Intrapartum Procedures: spontaneous vaginal delivery and GBS prophylaxis Postpartum Procedures: TDaP vaccine Complications-Operative and Postpartum: 3rd degree perineal laceration Hemoglobin  Date Value Range Status  02/03/2012 11.3* 12.0-15.0 (g/dL) Final     HCT  Date Value Range Status  02/03/2012 34.7* 36.0-46.0 (%) Final    Physical Exam:  General: alert, cooperative, no distress and moderately obese Lochia: appropriate Uterine Fundus: firm Incision: healing well, + hemorrhoid/non-thrombosed DVT Evaluation: Negative Homan's sign. No significant calf/ankle edema.  Discharge Diagnoses: Term Pregnancy-delivered and hemorrhoids  Discharge Information: Date: 02/04/2012 Activity: pelvic rest Diet: routine Medications: PNV, Ibuprofen, Colace, Percocet and Proctofoam HC, mineral oil Condition: stable Instructions: refer to practice specific booklet Discharge to: home Follow-up Information    Follow up with LAVOIE,MARIE-LYNE, MD. Schedule an appointment as soon as possible for a visit in 6 weeks.   Contact information:   [address] [place] [address] [phone]          Newborn Data: Live born female " Lalla" Birth Weight: 9 lb 1.5 oz (4125 g) APGAR: 8, 8  Home with mother.  Shermika Balthaser 02/04/2012, 10:17 AM

## 2012-02-06 ENCOUNTER — Inpatient Hospital Stay (HOSPITAL_COMMUNITY): Admission: RE | Admit: 2012-02-06 | Payer: PRIVATE HEALTH INSURANCE | Source: Ambulatory Visit

## 2013-02-24 ENCOUNTER — Emergency Department (HOSPITAL_COMMUNITY)
Admission: EM | Admit: 2013-02-24 | Discharge: 2013-02-24 | Disposition: A | Discharge status: Home / Self Care | Payer: Self-pay | Attending: Emergency Medicine | Admitting: Emergency Medicine

## 2013-02-24 ENCOUNTER — Encounter (HOSPITAL_COMMUNITY): Payer: Self-pay | Admitting: Emergency Medicine

## 2013-02-24 ENCOUNTER — Emergency Department (HOSPITAL_COMMUNITY): Payer: Self-pay

## 2013-02-24 DIAGNOSIS — W268XXA Contact with other sharp object(s), not elsewhere classified, initial encounter: Secondary | ICD-10-CM | POA: Insufficient documentation

## 2013-02-24 DIAGNOSIS — S61409A Unspecified open wound of unspecified hand, initial encounter: Secondary | ICD-10-CM | POA: Insufficient documentation

## 2013-02-24 DIAGNOSIS — Y929 Unspecified place or not applicable: Secondary | ICD-10-CM | POA: Insufficient documentation

## 2013-02-24 DIAGNOSIS — Z23 Encounter for immunization: Secondary | ICD-10-CM | POA: Insufficient documentation

## 2013-02-24 DIAGNOSIS — Y9389 Activity, other specified: Secondary | ICD-10-CM | POA: Insufficient documentation

## 2013-02-24 DIAGNOSIS — S61411A Laceration without foreign body of right hand, initial encounter: Secondary | ICD-10-CM

## 2013-02-24 DIAGNOSIS — Z8679 Personal history of other diseases of the circulatory system: Secondary | ICD-10-CM | POA: Insufficient documentation

## 2013-02-24 MED ORDER — TETANUS-DIPHTH-ACELL PERTUSSIS 5-2.5-18.5 LF-MCG/0.5 IM SUSP
0.5000 mL | Freq: Once | INTRAMUSCULAR | Status: AC
  Administered 2013-02-24: 0.5 mL via INTRAMUSCULAR
  Filled 2013-02-24: qty 0.5

## 2013-02-24 NOTE — ED Provider Notes (Signed)
History    This chart was scribed for a non-physician practitioner working with Colleen Crease, MD by Jiles Prows, ED scribe. This patient was seen in room TR08C/TR08C and the patient's care was started at 8:59 PM.  CSN: [id number] Arrival date & time 02/24/13  1959   Chief Complaint  Patient presents with  . Laceration   The history is provided by the patient and medical records. No language interpreter was used.   HPI Comments: Colleen Terrell is a 32 y.o. female who presents to the Emergency Department complaining of moderate, constant pain to right hand onset this evening.  Pt reports that she accidentally broke a glass container around 7 pm and lacerated her right hand on the glass.  Pt is unsure of tetanus.  Bleeding is controlled at this time with pressure.  She denies numbness or tingling.  Pt denies fever or chills.  She has full ROM of all fingers of the right hand.  No treatment prior to arrival in the ED.    Past Medical History  Diagnosis Date  . Unspecified hemorrhoids without mention of complication   . History of kidney infection during pregnancy   . PP NVB 02/02/12 02/02/2012   History reviewed. No pertinent past surgical history. Family History  Problem Relation Age of Onset  . Hypertension Father   . Anesthesia problems Neg Hx    History  Substance Use Topics  . Smoking status: Never Smoker   . Smokeless tobacco: Never Used  . Alcohol Use: No   OB History   Grav Para Term Preterm Abortions TAB SAB Ect Mult Living   2 2 2       2      Review of Systems  Skin: Positive for wound (right hand).  All other systems reviewed and are negative.    Allergies  Review of patient's allergies indicates no known allergies.  Home Medications   Current Outpatient Rx  Name  Route  Sig  Dispense  Refill  . ibuprofen (ADVIL,MOTRIN) 200 MG tablet   Oral   Take 400 mg by mouth daily as needed for pain.          BP 137/90  Pulse 89  Temp(Src) 98.5 F  (36.9 C) (Oral)  Resp 16  SpO2 97%  LMP 02/22/2013 Physical Exam  Nursing note and vitals reviewed. Constitutional: She is oriented to person, place, and time. She appears well-developed and well-nourished. No distress.  HENT:  Head: Normocephalic and atraumatic.  Eyes: EOM are normal.  Neck: Neck supple. No tracheal deviation present.  Cardiovascular: Normal rate, regular rhythm and normal heart sounds.   Good capillary refill of right thumb.  Pulmonary/Chest: Effort normal and breath sounds normal. No respiratory distress.  Musculoskeletal: Normal range of motion.  Normal flection, extension, abduction, adduction, and opposition of the right thumb.  Good muscle strength with resistance of the thumb.  Neurological: She is alert and oriented to person, place, and time.  Sensation to all fingers of the right hand intact.  Skin: Skin is warm and dry.  2 cm linear laceration of dorsal aspect of the right hand just proximal to the thumb over the 1st metacarpal.   Also 3 mm linear laceration on palmar surface of right hand.     Psychiatric: She has a normal mood and affect. Her behavior is normal.    ED Course  Procedures (including critical care time) DIAGNOSTIC STUDIES: Oxygen Saturation is 97% on RA, adequate by my interpretation.  COORDINATION OF CARE: 9:32 PM - Discussed ED treatment with pt at bedside including tetanus,  Numbing, and stitches and pt agrees.   LACERATION REPAIR PROCEDURE NOTE The patient's identification was confirmed and consent was obtained. This procedure was performed by Magnus Sinning, PA-C,  at 10:42 PM. Site: right dorsum of thumb Sterile procedures observed Anesthetic used (type and amt): 2 % lidocaine with epinepherine Suture type/size: 4.0 Prolene Length: 2 cm # of Sutures: 5 Technique: simple interrupted Complexity simple Antibx ointment applied Tetanus ordered Site anesthetized, irrigated with NS, explored without evidence of foreign  body, wound well approximated, site covered with dry, sterile dressing.  Patient tolerated procedure well without complications. Instructions for care discussed verbally and patient provided with additional written instructions for homecare and f/u.  10:52 PM - Advised pt to follow up at Urgent Care next Wednesday to have the stitches removed.  Labs Reviewed - No data to display Dg Hand Complete Right  02/24/2013   *RADIOLOGY REPORT*  Clinical Data: Laceration  RIGHT HAND - COMPLETE 3+ VIEW  Comparison: None.  Findings: Soft tissue defects seen lateral to the first MCP joint is consistent with soft tissue laceration.  No radiopaque foreign body identified.  No fracture or dislocation.  Joint spaces are preserved.  Osseous mineralization is normal.  IMPRESSION: Soft tissue laceration at the lateral aspect of the first MCP joint.  No radiopaque foreign body.  No fracture or dislocation.   Original Report Authenticated By: Rise Mu, M.D.   No diagnosis found.  MDM  Patient presenting with laceration of her right hand.  Laceration repaired with sutures without difficulty.  No apparent tendon damage.  Patient neurovascularly intact.  Tetanus updated.  Patient stable for discharge.  Return precautions given.  I personally performed the services described in this documentation, which was scribed in my presence. The recorded information has been reviewed and is accurate.    Pascal Lux [place], PA-C 02/25/13 1357  Pascal Lux [place], PA-C 02/25/13 1357

## 2013-02-24 NOTE — ED Notes (Signed)
PT. PRESENTS WITH LACERATIONS AT RIGHT HAND , GLASS PITCHER ACCIDENTALLY BROKE AND HIT HER RIGHT HAND , DRESSING APPLIED AT TRIAGE.

## 2013-02-24 NOTE — ED Notes (Signed)
Pt applied dermabond on the right hand

## 2013-02-24 NOTE — ED Notes (Signed)
Lacerations to the right hand cleaned and re-bandaged. Waiting on PA to suture. Suture cart by bedside

## 2013-02-26 NOTE — ED Provider Notes (Signed)
Medical screening examination/treatment/procedure(s) were performed by non-physician practitioner and as supervising physician I was immediately available for consultation/collaboration.    Damel Querry J. Graelyn Bihl, MD 02/26/13 0704 

## 2013-04-20 ENCOUNTER — Encounter (HOSPITAL_BASED_OUTPATIENT_CLINIC_OR_DEPARTMENT_OTHER): Payer: Self-pay

## 2013-04-20 ENCOUNTER — Emergency Department (HOSPITAL_BASED_OUTPATIENT_CLINIC_OR_DEPARTMENT_OTHER): Payer: Self-pay

## 2013-04-20 ENCOUNTER — Emergency Department (HOSPITAL_BASED_OUTPATIENT_CLINIC_OR_DEPARTMENT_OTHER)
Admission: EM | Admit: 2013-04-20 | Discharge: 2013-04-20 | Disposition: A | Discharge status: Home / Self Care | Payer: Self-pay | Attending: Emergency Medicine | Admitting: Emergency Medicine

## 2013-04-20 DIAGNOSIS — S99911A Unspecified injury of right ankle, initial encounter: Secondary | ICD-10-CM

## 2013-04-20 DIAGNOSIS — S8990XA Unspecified injury of unspecified lower leg, initial encounter: Secondary | ICD-10-CM | POA: Insufficient documentation

## 2013-04-20 DIAGNOSIS — Y9301 Activity, walking, marching and hiking: Secondary | ICD-10-CM | POA: Insufficient documentation

## 2013-04-20 DIAGNOSIS — X500XXA Overexertion from strenuous movement or load, initial encounter: Secondary | ICD-10-CM | POA: Insufficient documentation

## 2013-04-20 DIAGNOSIS — Y929 Unspecified place or not applicable: Secondary | ICD-10-CM | POA: Insufficient documentation

## 2013-04-20 DIAGNOSIS — Z87448 Personal history of other diseases of urinary system: Secondary | ICD-10-CM | POA: Insufficient documentation

## 2013-04-20 DIAGNOSIS — Z8679 Personal history of other diseases of the circulatory system: Secondary | ICD-10-CM | POA: Insufficient documentation

## 2013-04-20 MED ORDER — HYDROCODONE-ACETAMINOPHEN 5-325 MG PO TABS: 1.0000 | ORAL_TABLET | ORAL | Status: DC | PRN

## 2013-04-20 MED ORDER — OXYCODONE-ACETAMINOPHEN 5-325 MG PO TABS
ORAL_TABLET | ORAL | Status: AC
  Administered 2013-04-20: 1 via ORAL
  Filled 2013-04-20: qty 1

## 2013-04-20 MED ORDER — OXYCODONE-ACETAMINOPHEN 5-325 MG PO TABS
1.0000 | ORAL_TABLET | Freq: Once | ORAL | Status: AC
  Administered 2013-04-20: 1 via ORAL
  Filled 2013-04-20: qty 1

## 2013-04-20 NOTE — ED Provider Notes (Signed)
CSN: [id number]     Arrival date & time 04/20/13  1909 History   First MD Initiated Contact with Patient 04/20/13 1919     Chief Complaint  Patient presents with  . Ankle Injury   (Consider location/radiation/quality/duration/timing/severity/associated sxs/prior Treatment) Patient is a 32 y.o. female presenting with lower extremity injury. The history is provided by the patient and medical records.  Ankle Injury Associated symptoms include arthralgias.   Pt presents to the ED for right ankle injury.  Pt states she was walking down her front steps but missed the last step and twisted her right ankle.  Denies any head trauma or LOC.  Pt states she has walked since injury but it is extremely painful.  Denies any numbness or paresthesias No prior right ankle or foot injury.  No meds taken PTA.  Past Medical History  Diagnosis Date  . Unspecified hemorrhoids without mention of complication   . History of kidney infection during pregnancy   . PP NVB 02/02/12 02/02/2012   No past surgical history on file. Family History  Problem Relation Age of Onset  . Hypertension Father   . Anesthesia problems Neg Hx    History  Substance Use Topics  . Smoking status: Never Smoker   . Smokeless tobacco: Never Used  . Alcohol Use: No   OB History   Grav Para Term Preterm Abortions TAB SAB Ect Mult Living   2 2 2       2      Review of Systems  Musculoskeletal: Positive for arthralgias.  All other systems reviewed and are negative.    Allergies  Review of patient's allergies indicates no known allergies.  Home Medications   Current Outpatient Rx  Name  Route  Sig  Dispense  Refill  . ibuprofen (ADVIL,MOTRIN) 200 MG tablet   Oral   Take 400 mg by mouth daily as needed for pain.          BP 143/81  Pulse 84  Temp(Src) 98.6 F (37 C) (Oral)  Resp 18  Ht 5\' 9"  (1.753 m)  Wt 226 lb (102.513 kg)  BMI 33.36 kg/m2  SpO2 99%  LMP 04/14/2013  Physical Exam  Nursing note and vitals  reviewed. Constitutional: She is oriented to person, place, and time. She appears well-developed and well-nourished. No distress.  HENT:  Head: Normocephalic and atraumatic.  Eyes: Conjunctivae and EOM are normal.  Neck: Normal range of motion. Neck supple.  Cardiovascular: Normal rate, regular rhythm and normal heart sounds.   Pulmonary/Chest: Effort normal and breath sounds normal. No respiratory distress. She has no wheezes.  Musculoskeletal:       Right ankle: She exhibits decreased range of motion. She exhibits no swelling, no ecchymosis, no deformity, no laceration and normal pulse. Tenderness. Lateral malleolus and AITFL tenderness found. Achilles tendon normal.       Feet:  Right ankle with TTP of lateral malleolus and AITFL, no swelling, bruising, deformity, or other visible signs of trauma; decreased flexion/extension due to pain; moves all toes appropriately; strong distal pulse, sensation intact  Neurological: She is alert and oriented to person, place, and time.  Skin: Skin is warm and dry. She is not diaphoretic.  Psychiatric: She has a normal mood and affect.    ED Course  Procedures (including critical care time) Labs Review Labs Reviewed - No data to display Imaging Review Dg Ankle Complete Right  04/20/2013   *RADIOLOGY REPORT*  Clinical Data: Right ankle pain and swelling after injury.  RIGHT ANKLE - COMPLETE 3+ VIEW  Comparison: None.  Findings: No fracture or dislocation is noted.  Joint spaces are intact. No soft tissue abnormality is noted.  IMPRESSION: Normal right ankle.   Original Report Authenticated By: Lupita Raider.,  M.D.    MDM   1. Ankle injury, right, initial encounter    X-ray negative for acute fx or dislocation.  Ankle brace placed and crutches given.  Pt instructed to FU with orthopedics if no improvement in 1 week.  Rx vicodin.  Discussed plan with pt, they agreed.  Return precautions advised.    Garlon Hatchet, PA-C 04/20/13 2014  Garlon Hatchet, PA-C 04/20/13 2014

## 2013-04-20 NOTE — ED Provider Notes (Signed)
Medical screening examination/treatment/procedure(s) were performed by non-physician practitioner and as supervising physician I was immediately available for consultation/collaboration.    Glynn Octave, MD 04/20/13 2236

## 2013-04-20 NOTE — ED Notes (Signed)
Missed step-twisted right ankel approx 230pm-to triage and tx area via w/c

## 2014-02-22 ENCOUNTER — Inpatient Hospital Stay (HOSPITAL_COMMUNITY)
Admission: AD | Admit: 2014-02-22 | Discharge: 2014-02-22 | Disposition: A | Discharge status: Home / Self Care | Payer: Medicaid Other | Source: Ambulatory Visit | Attending: Obstetrics & Gynecology | Admitting: Obstetrics & Gynecology

## 2014-02-22 ENCOUNTER — Encounter (HOSPITAL_COMMUNITY): Payer: Self-pay | Admitting: *Deleted

## 2014-02-22 ENCOUNTER — Inpatient Hospital Stay (HOSPITAL_COMMUNITY): Payer: Medicaid Other

## 2014-02-22 DIAGNOSIS — O4690 Antepartum hemorrhage, unspecified, unspecified trimester: Secondary | ICD-10-CM

## 2014-02-22 DIAGNOSIS — O26859 Spotting complicating pregnancy, unspecified trimester: Secondary | ICD-10-CM | POA: Diagnosis not present

## 2014-02-22 DIAGNOSIS — O30009 Twin pregnancy, unspecified number of placenta and unspecified number of amniotic sacs, unspecified trimester: Secondary | ICD-10-CM | POA: Diagnosis not present

## 2014-02-22 DIAGNOSIS — O209 Hemorrhage in early pregnancy, unspecified: Secondary | ICD-10-CM

## 2014-02-22 LAB — URINALYSIS, ROUTINE W REFLEX MICROSCOPIC
BILIRUBIN URINE: NEGATIVE
GLUCOSE, UA: NEGATIVE mg/dL
HGB URINE DIPSTICK: NEGATIVE
KETONES UR: NEGATIVE mg/dL
Leukocytes, UA: NEGATIVE
NITRITE: NEGATIVE
PH: 7 (ref 5.0–8.0)
Protein, ur: 30 mg/dL — AB
SPECIFIC GRAVITY, URINE: 1.02 (ref 1.005–1.030)
Urobilinogen, UA: 0.2 mg/dL (ref 0.0–1.0)

## 2014-02-22 LAB — CBC
HCT: 36 % (ref 36.0–46.0)
HEMOGLOBIN: 12.1 g/dL (ref 12.0–15.0)
MCH: 28.4 pg (ref 26.0–34.0)
MCHC: 33.6 g/dL (ref 30.0–36.0)
MCV: 84.5 fL (ref 78.0–100.0)
PLATELETS: 312 10*3/uL (ref 150–400)
RBC: 4.26 MIL/uL (ref 3.87–5.11)
RDW: 14.9 % (ref 11.5–15.5)
WBC: 10.4 10*3/uL (ref 4.0–10.5)

## 2014-02-22 LAB — URINE MICROSCOPIC-ADD ON

## 2014-02-22 LAB — POCT PREGNANCY, URINE: Preg Test, Ur: POSITIVE — AB

## 2014-02-22 LAB — HCG, QUANTITATIVE, PREGNANCY: hCG, Beta Chain, Quant, S: 126222 m[IU]/mL — ABNORMAL HIGH (ref <5)

## 2014-02-22 MED ORDER — PROMETHAZINE HCL 25 MG PO TABS: 12.5000 mg | ORAL_TABLET | Freq: Four times a day (QID) | ORAL | Status: DC | PRN

## 2014-02-22 MED ORDER — ACETAMINOPHEN 325 MG PO TABS
650.0000 mg | ORAL_TABLET | Freq: Once | ORAL | Status: AC
  Administered 2014-02-22: 650 mg via ORAL
  Filled 2014-02-22: qty 2

## 2014-02-22 NOTE — MAU Provider Note (Signed)
Attestation of Attending Supervision of Advanced Practitioner (CNM/NP): Evaluation and management procedures were performed by the Advanced Practitioner under my supervision and collaboration.  I have reviewed the Advanced Practitioner's note and chart, and I agree with the management and plan.  HARRAWAY-SMITH, CAROLYN 9:05 AM

## 2014-02-22 NOTE — MAU Note (Signed)
Pt states that she woke up and felt some wetness in her underwear. When in the bathroom the patient noticed some vaginal bloody. After that, she reports feeling hot and dizzy. Pt denies pain @ this time.

## 2014-02-22 NOTE — MAU Provider Note (Signed)
History     CSN: [id number]  Arrival date and time: 02/22/14 [id number]   None     Chief Complaint  Patient presents with  . Vaginal Bleeding   Vaginal Bleeding    Colleen Terrell is a 33 y.o. G3P2002 at 6762w1d who presents today with spotting. She states that when she woke up she had "a lot of blood and mucous when she wiped". She states that she had some cramps, but she has no pain now. [facility]   Past Medical History  Diagnosis Date  . Unspecified hemorrhoids without mention of complication   . History of kidney infection during pregnancy   . PP NVB 02/02/12 02/02/2012    History reviewed. No pertinent past surgical history.  Family History  Problem Relation Age of Onset  . Hypertension Father   . Anesthesia problems Neg Hx     History  Substance Use Topics  . Smoking status: Never Smoker   . Smokeless tobacco: Never Used  . Alcohol Use: No    Allergies: No Known Allergies  Prescriptions prior to admission  Medication Sig Dispense Refill  . Prenatal Vit-Fe Fumarate-FA (MULTIVITAMIN-PRENATAL) 27-0.8 MG TABS tablet Take 1 tablet by mouth daily at 12 noon.      Marland Kitchen. HYDROcodone-acetaminophen (NORCO/VICODIN) 5-325 MG per tablet Take 1 tablet by mouth every 4 (four) hours as needed for pain.  10 tablet  0  . ibuprofen (ADVIL,MOTRIN) 200 MG tablet Take 400 mg by mouth daily as needed for pain.        Review of Systems  Genitourinary: Positive for vaginal bleeding.   Physical Exam   Blood pressure 143/76, pulse 77, temperature 98.3 F (36.8 C), temperature source Oral, resp. rate 18, last menstrual period 12/27/2013.  Physical Exam  Nursing note and vitals reviewed. Constitutional: She is oriented to person, place, and time. She appears well-developed and well-nourished. No distress.  Cardiovascular: Normal rate.   Respiratory: Effort normal.  GI: Soft. There is no tenderness. There is no rebound.  Neurological: She is alert and  oriented to person, place, and time.  Skin: Skin is warm and dry.  Psychiatric: She has a normal mood and affect.    MAU Course  Procedures  Results for orders placed during the hospital encounter of 02/22/14 (from the past 24 hour(s))  URINALYSIS, ROUTINE W REFLEX MICROSCOPIC     Status: Abnormal   Collection Time    02/22/14  5:45 AM      Result Value Ref Range   Color, Urine YELLOW  YELLOW   APPearance CLEAR  CLEAR   Specific Gravity, Urine 1.020  1.005 - 1.030   pH 7.0  5.0 - 8.0   Glucose, UA NEGATIVE  NEGATIVE mg/dL   Hgb urine dipstick NEGATIVE  NEGATIVE   Bilirubin Urine NEGATIVE  NEGATIVE   Ketones, ur NEGATIVE  NEGATIVE mg/dL   Protein, ur 30 (*) NEGATIVE mg/dL   Urobilinogen, UA 0.2  0.0 - 1.0 mg/dL   Nitrite NEGATIVE  NEGATIVE   Leukocytes, UA NEGATIVE  NEGATIVE  URINE MICROSCOPIC-ADD ON     Status: None   Collection Time    02/22/14  5:45 AM      Result Value Ref Range   Squamous Epithelial / LPF RARE  RARE   WBC, UA 0-2  <3 WBC/hpf  POCT PREGNANCY, URINE     Status: Abnormal   Collection Time    02/22/14  5:49 AM  Result Value Ref Range   Preg Test, Ur POSITIVE (*) NEGATIVE  CBC     Status: None   Collection Time    02/22/14  6:20 AM      Result Value Ref Range   WBC 10.4  4.0 - 10.5 K/uL   RBC 4.26  3.87 - 5.11 MIL/uL   Hemoglobin 12.1  12.0 - 15.0 g/dL   HCT 81.136.0  91.436.0 - 78.246.0 %   MCV 84.5  78.0 - 100.0 fL   MCH 28.4  26.0 - 34.0 pg   MCHC 33.6  30.0 - 36.0 g/dL   RDW 95.614.9  21.311.5 - 08.615.5 %   Platelets 312  150 - 400 K/uL  HCG, QUANTITATIVE, PREGNANCY     Status: Abnormal   Collection Time    02/22/14  6:20 AM      Result Value Ref Range   hCG, Beta Chain, Mahalia LongestQuant, S [id number] (*) <5 mIU/mL     Assessment and Plan   1. Vaginal bleeding in pregnancy, first trimester    Twin gestation on US Bleeding precautions Pelvic rest [facility] as possible Return to MAU as needed   Tawnya CrookHogan, Heather Donovan 02/22/2014, 7:33 AM

## 2014-02-22 NOTE — Discharge Instructions (Signed)
Nausea medication to take during pregnancy:   Unisom (doxylamine succinate 25 mg tablets) Take one tablet daily at bedtime. If symptoms are not adequately controlled, the dose can be increased to a maximum recommended dose of two tablets daily (1/2 tablet in the morning, 1/2 tablet mid-afternoon and one at bedtime).  Vitamin B6 100mg  tablets. Take one tablet twice a day (up to 200 mg per day).  Add Phenergan as prescribed to take as needed.   Pelvic Rest Pelvic rest is sometimes recommended for women when:   The placenta is partially or completely covering the opening of the cervix (placenta previa).  There is bleeding between the uterine wall and the amniotic sac in the first trimester (subchorionic hemorrhage).  The cervix begins to open without labor starting (incompetent cervix, cervical insufficiency).  The labor is too early (preterm labor). HOME CARE INSTRUCTIONS  Do not have sexual intercourse, stimulation, or an orgasm.  Do not use tampons, douche, or put anything in the vagina.  Do not lift anything over 10 pounds (4.5 kg).  Avoid strenuous activity or straining your pelvic muscles. SEEK MEDICAL CARE IF:  You have any vaginal bleeding during pregnancy. Treat this as a potential emergency.  You have cramping pain felt low in the stomach (stronger than menstrual cramps).  You notice vaginal discharge (watery, mucus, or bloody).  You have a low, dull backache.  There are regular contractions or uterine tightening. SEEK IMMEDIATE MEDICAL CARE IF: You have vaginal bleeding and have placenta previa.  Document Released: 12/08/2010 Document Revised: 11/05/2011 Document Reviewed: 12/08/2010 [facility] Patient Information 2015 [place], [place]. This information is not intended to replace advice given to you by your health care provider. Make sure you discuss any questions you have with your health care provider.  Vaginal Bleeding During Pregnancy, First Trimester A small  amount of bleeding (spotting) from the vagina is relatively common in early pregnancy. It usually stops on its own. Various things may cause bleeding or spotting in early pregnancy. Some bleeding may be related to the pregnancy, and some may not. In most cases, the bleeding is normal and is not a problem. However, bleeding can also be a sign of something serious. Be sure to tell your health care provider about any vaginal bleeding right away. Some possible causes of vaginal bleeding during the first trimester include:  Infection or inflammation of the cervix.  Growths (polyps) on the cervix.  Miscarriage or threatened miscarriage.  Pregnancy tissue has developed outside of the uterus and in a fallopian tube (tubal pregnancy).  Tiny cysts have developed in the uterus instead of pregnancy tissue (molar pregnancy). HOME CARE INSTRUCTIONS  Watch your condition for any changes. The following actions may help to lessen any discomfort you are feeling:  Follow your health care provider's instructions for limiting your activity. If your health care provider orders bed rest, you may need to stay in bed and only get up to use the bathroom. However, your health care provider may allow you to continue light activity.  If needed, make plans for someone to help with your regular activities and responsibilities while you are on bed rest.  Keep track of the number of pads you use each day, how often you change pads, and how soaked (saturated) they are. Write this down.  Do not use tampons. Do not douche.  Do not have sexual intercourse or orgasms until approved by your health care provider.  If you pass any tissue from your vagina, save the tissue so  you can show it to your health care provider.  Only take over-the-counter or prescription medicines as directed by your health care provider.  Do not take aspirin because it can make you bleed.  Keep all follow-up appointments as directed by your health  care provider. SEEK MEDICAL CARE IF:  You have any vaginal bleeding during any part of your pregnancy.  You have cramps or labor pains.  You have a fever, not controlled by medicine. SEEK IMMEDIATE MEDICAL CARE IF:   You have severe cramps in your back or belly (abdomen).  You pass large clots or tissue from your vagina.  Your bleeding increases.  You feel light-headed or weak, or you have fainting episodes.  You have chills.  You are leaking fluid or have a gush of fluid from your vagina.  You pass out while having a bowel movement. MAKE SURE YOU:  Understand these instructions.  Will watch your condition.  Will get help right away if you are not doing well or get worse. Document Released: 05/23/2005 Document Revised: 08/18/2013 Document Reviewed: 04/20/2013 [facility] Patient Information 2015 [place], [place]. This information is not intended to replace advice given to you by your health care provider. Make sure you discuss any questions you have with your health care provider.

## 2014-03-18 ENCOUNTER — Ambulatory Visit (INDEPENDENT_AMBULATORY_CARE_PROVIDER_SITE_OTHER): Payer: PRIVATE HEALTH INSURANCE | Admitting: Obstetrics & Gynecology

## 2014-03-18 ENCOUNTER — Encounter: Payer: Self-pay | Admitting: Obstetrics & Gynecology

## 2014-03-18 ENCOUNTER — Other Ambulatory Visit: Payer: Self-pay | Admitting: Obstetrics & Gynecology

## 2014-03-18 VITALS — BP 113/72 | HR 91 | Temp 97.3°F | Wt 246.7 lb

## 2014-03-18 DIAGNOSIS — O30009 Twin pregnancy, unspecified number of placenta and unspecified number of amniotic sacs, unspecified trimester: Secondary | ICD-10-CM

## 2014-03-18 DIAGNOSIS — Z3491 Encounter for supervision of normal pregnancy, unspecified, first trimester: Secondary | ICD-10-CM

## 2014-03-18 DIAGNOSIS — O30001 Twin pregnancy, unspecified number of placenta and unspecified number of amniotic sacs, first trimester: Secondary | ICD-10-CM

## 2014-03-18 DIAGNOSIS — O30049 Twin pregnancy, dichorionic/diamniotic, unspecified trimester: Secondary | ICD-10-CM | POA: Insufficient documentation

## 2014-03-18 DIAGNOSIS — Z3682 Encounter for antenatal screening for nuchal translucency: Secondary | ICD-10-CM

## 2014-03-18 DIAGNOSIS — Z3480 Encounter for supervision of other normal pregnancy, unspecified trimester: Secondary | ICD-10-CM

## 2014-03-18 LAB — POCT URINALYSIS DIP (DEVICE)
BILIRUBIN URINE: NEGATIVE
GLUCOSE, UA: NEGATIVE mg/dL
Hgb urine dipstick: NEGATIVE
Leukocytes, UA: NEGATIVE
NITRITE: NEGATIVE
Protein, ur: 30 mg/dL — AB
SPECIFIC GRAVITY, URINE: 1.02 (ref 1.005–1.030)
UROBILINOGEN UA: 0.2 mg/dL (ref 0.0–1.0)
pH: 6 (ref 5.0–8.0)

## 2014-03-18 NOTE — Progress Notes (Signed)
Nutrition note: 1st visit consult Pt is pregnant with twins. Pt has h/o obesity. Pt has lost 1.2# @ 5285w4d. Pt reports she has a lot of N/V but is taking phenergan & vitamin B6, which is helping but they make her sleepy. Pt reports eating ~2x/d due to N/V and heartburn. Pt is taking a PNV. Pt received verbal & written education on nutrition during pregnancy with twins. Encouraged energy dense snacks & eating 6-8x/d. Discussed tips to decrease N/V and heartburn. Discussed wt gain goals of 25-42# or 1#/wk in 2nd & 3rd trimester. Pt agrees to add more snacks with good protein sources. Pt has WIC & plans to BF. F/u in 4-6 wks Blondell RevealLaura Shirl Weir, MS, RD, LDN, [facility]

## 2014-03-18 NOTE — Progress Notes (Signed)
Pt has bad reflux is questioning can she take Zantac. Pt reports nausea all day/everyday.  Scant amount of blood/mucous last week.  New OB packet given

## 2014-03-18 NOTE — Patient Instructions (Signed)
Multiple Pregnancy A multiple pregnancy is when a woman is pregnant with two or more fetuses. Multiple pregnancies occur in about 3% of all births. The more babies in a pregnancy, the greater the risks of problems to the babies and mother. This includes death. Since the use of Assisted Reproductive Technology (ART) and medications that can induce ovulation, multiple fetal gestation has increased.  RISKS TO THE MOTHER  Preeclampsia and eclampsia.  Postpartum bleeding (hemorrhage).  Kidney infection (pyelonephritis).  Develop anemia.  Develop diabetes.  Liver complications.  A blood clot blocks the artery, or branch of the artery leading to the lungs (pulmonary embolism).  Blood clots in the leg.  Placental separation.  Higher rate of Cesarean Section deliveries.  Women over 35 years old have a higher rate of Downs Syndrome babies. RISKS TO THE BABY  Preterm labor with a premature baby.  Very low birth weight babies that are less than 3 pounds, especially with triplets or mores.  Premature rupture of the membranes.  Twin to twin blood transfusion with one baby anemic and the other baby with too much blood in its system. There may also be heart failure.  With triplets or more, one of the babies is at high risk for cerebral palsy or other neurologic problem.  There is a higher incidence of fetal death. CARE OF MOTHERS WITH MULTIPLE FETAL GESTATION Multiple pregnancies need more care and special prenatal care.  You will see your caregiver more often.  You will have more tests including ultrasounds, nonstress tests and blood tests.  You will have special tests done called amniocentesis and a biophysical profile.  You may be hospitalized more often during the pregnancy.  You will be encouraged to eat a balanced and healthy diet with vitamin and mineral supplements as directed.  You will be asked to get more rest and sleep to keep up your energy.  You will be asked to  restrict your daily activities, exercise, work, household chores and sexual activity.  If you have preterm labor with small babies, you will be given a steroid injection to help the babies lungs mature and do better when born.  The delivery may have to be by Cesarean delivery, especially if there are triplets or more.  The delivery should be in a hospital with an intensive care nursery and Neonatologists (pediatrician for high risk babies) to care for the newborn babies. HOME CARE INSTRUCTIONS   Follow the caregiver's recommendations regarding office visits, tests for you and the babies, diet, rest and medications.  Avoid a large amount of physical activity.  Arrange to have help after the babies are born and when you go home from the hospital.  Take classes on how to care for multiple babies before you deliver them. SEEK IMMEDIATE MEDICAL CARE IF:   You develop a temperature of 102 F (38.9 C) or higher.  You are leaking fluid from the vagina.  You develop vaginal bleeding.  You develop uterine contractions.  You develop a severe headache, severe upper abdominal pain, visual problems or excessive swelling of your face, hands and feet.  You develop severe back pain or leg pain.  You develop severe tiredness.  You develop chest pain.  You have shortness of breath, fall down or pass out. Document Released: 05/22/2008 Document Revised: 11/05/2011 Document Reviewed: 07/17/2013 ExitCare Patient Information 2015 [employer]. This information is not intended to replace advice given to you by your health care provider. Make sure you discuss any questions you have with   your health care provider.  

## 2014-03-19 LAB — OBSTETRIC PANEL
ANTIBODY SCREEN: NEGATIVE
BASOS ABS: 0 10*3/uL (ref 0.0–0.1)
BASOS PCT: 0 % (ref 0–1)
EOS ABS: 0.2 10*3/uL (ref 0.0–0.7)
Eosinophils Relative: 2 % (ref 0–5)
HCT: 34.3 % — ABNORMAL LOW (ref 36.0–46.0)
HEMOGLOBIN: 11.5 g/dL — AB (ref 12.0–15.0)
HEP B S AG: NEGATIVE
Lymphocytes Relative: 28 % (ref 12–46)
Lymphs Abs: 2.5 10*3/uL (ref 0.7–4.0)
MCH: 27.4 pg (ref 26.0–34.0)
MCHC: 33.5 g/dL (ref 30.0–36.0)
MCV: 81.9 fL (ref 78.0–100.0)
MONOS PCT: 5 % (ref 3–12)
Monocytes Absolute: 0.5 10*3/uL (ref 0.1–1.0)
NEUTROS PCT: 65 % (ref 43–77)
Neutro Abs: 5.9 10*3/uL (ref 1.7–7.7)
Platelets: 293 10*3/uL (ref 150–400)
RBC: 4.19 MIL/uL (ref 3.87–5.11)
RDW: 14.7 % (ref 11.5–15.5)
RH TYPE: POSITIVE
Rubella: 1.53 Index — ABNORMAL HIGH (ref <0.90)
WBC: 9.1 10*3/uL (ref 4.0–10.5)

## 2014-03-19 LAB — GLUCOSE TOLERANCE, 1 HOUR (50G) W/O FASTING: Glucose, 1 Hour GTT: 118 mg/dL (ref 70–140)

## 2014-03-19 LAB — CYTOLOGY - PAP

## 2014-03-19 LAB — HIV ANTIBODY (ROUTINE TESTING W REFLEX): HIV 1&2 Ab, 4th Generation: NONREACTIVE

## 2014-03-20 LAB — CULTURE, OB URINE: Colony Count: >=100000

## 2014-03-22 LAB — HEMOGLOBINOPATHY EVALUATION
HGB A2 QUANT: 2.8 % (ref 2.2–3.2)
Hemoglobin Other: 0 %
Hgb A: 96.6 % — ABNORMAL LOW (ref 96.8–97.8)
Hgb F Quant: 0.6 % (ref 0.0–2.0)
Hgb S Quant: 0 %

## 2014-03-23 ENCOUNTER — Encounter: Payer: Self-pay | Admitting: *Deleted

## 2014-03-30 ENCOUNTER — Ambulatory Visit (HOSPITAL_COMMUNITY): Admission: RE | Admit: 2014-03-30 | Payer: Medicaid Other | Source: Ambulatory Visit

## 2014-03-30 ENCOUNTER — Ambulatory Visit (HOSPITAL_COMMUNITY)
Admission: RE | Admit: 2014-03-30 | Discharge: 2014-03-30 | Disposition: A | Discharge status: Home / Self Care | Payer: Medicaid Other | Source: Ambulatory Visit | Attending: Obstetrics & Gynecology | Admitting: Obstetrics & Gynecology

## 2014-03-30 ENCOUNTER — Ambulatory Visit (HOSPITAL_COMMUNITY)
Admission: RE | Admit: 2014-03-30 | Discharge: 2014-03-30 | Disposition: A | Discharge status: Home / Self Care | Payer: Medicaid Other | Source: Ambulatory Visit | Attending: Family Medicine | Admitting: Family Medicine

## 2014-03-30 ENCOUNTER — Encounter (HOSPITAL_COMMUNITY): Payer: Self-pay

## 2014-03-30 DIAGNOSIS — IMO0002 Reserved for concepts with insufficient information to code with codable children: Secondary | ICD-10-CM

## 2014-03-30 DIAGNOSIS — O30049 Twin pregnancy, dichorionic/diamniotic, unspecified trimester: Secondary | ICD-10-CM | POA: Insufficient documentation

## 2014-03-30 DIAGNOSIS — O09529 Supervision of elderly multigravida, unspecified trimester: Secondary | ICD-10-CM | POA: Insufficient documentation

## 2014-03-30 DIAGNOSIS — Z3682 Encounter for antenatal screening for nuchal translucency: Secondary | ICD-10-CM

## 2014-03-30 DIAGNOSIS — Z36 Encounter for antenatal screening of mother: Secondary | ICD-10-CM | POA: Diagnosis not present

## 2014-03-30 DIAGNOSIS — O30009 Twin pregnancy, unspecified number of placenta and unspecified number of amniotic sacs, unspecified trimester: Secondary | ICD-10-CM | POA: Insufficient documentation

## 2014-03-30 NOTE — Progress Notes (Signed)
Genetic Counseling  High-Risk Gestation Note  Appointment Date:  03/30/2014 Referred By: Perry MountAcosta, Kristy Rocio, MD Date of Birth:  12/14/1980   Pregnancy History: [id number] Estimated Date of Delivery: 10/03/14 Estimated Gestational Age: 5062w2d Attending: Eulis FosterKristen Quinn, MD   Colleen Terrell was seen for genetic counseling because of a maternal age of 33 y.o. with a dichorionic diamniotic twin gestation. She will be 33 years old at delivery.      She was counseled regarding maternal age and the association with risk for chromosome conditions due to nondisjunction with aging of the ova.   We reviewed chromosomes, nondisjunction, and the associated 1 in 7178 risk for fetal aneuploidy related to a maternal age of 33 y.o. in a twin gestation.  She was counseled that the risk for aneuploidy decreases as gestational age increases, accounting for those pregnancies which spontaneously abort.  We specifically discussed Down syndrome (trisomy 4521), trisomies 6413 and 4218, and sex chromosome aneuploidies (47,XXX and 47,XXY) including the common features and prognoses of each.   Nuchal translucency ultrasound was attempted today, but nuchal translucency measurements were unable to be obtained. Complete ultrasound results reported separately.   We reviewed available screening options including First Screen, Quad screen, noninvasive prenatal screening (NIPS)/cell free DNA (cfDNA) testing, and detailed ultrasound.  She was counseled that screening tests are used to modify a patient's a priori risk for aneuploidy, typically based on age. This estimate provides a pregnancy specific risk assessment. We reviewed the benefits and limitations of each option. Specifically, we discussed the conditions for which each test screens, the detection rates, and false positive rates of each. She was also counseled regarding diagnostic testing via amniocentesis. We reviewed the approximate 1 in 300-500 risk for complications for  amniocentesis, including spontaneous pregnancy loss. After consideration of all the options, she elected to pursue ultrasound only in pregnancy and declined additional screening or testing for fetal aneuploidy, including NIPS, First screen, Quad screen, and amniocentesis.   Detailed ultrasound was scheduled for 05/04/14. She understands that screening tests cannot rule out all birth defects or genetic syndromes. The patient was advised of this limitation and states she still does not want additional testing at this time.   Both family histories were reviewed and found to be contributory for a female maternal first cousin to the patient with spina bifida. The specific etiology of his spina bifida was not known. We discussed that spina bifida, a type of neural tube defect (NTD) affects approximately 1 in 1000 live births.  We reviewed that the spine contains nerves that control the legs, bladder, and bowel.  If there is an opening or a change in the development of the spine, the nerves can be damaged or incompletely formed resulting in a wide range of health concerns and an increased risk for stillbirth.  NTDs occurs as an isolated finding, in the majority of cases.  Isolated  indicates that the NTD is the only birth difference that occurred in the baby.  Isolated NTDs are usually inherited in a multifactorial manner in which there is no prior family history.  Multifactorial conditions have both environmental and genetic factors that contribute to their development.  Both the genetic and environmental factors that contribute to the development of spina bifida are largely unknown; however, some medications and health conditions, such as uncontrolled diabetes and obesity, may increase the chance of spina bifida. NTDs may occur as a feature of an underlying genetic syndrome or condition.  Approximately 5-10% of individuals who have  spina bifida also have an underlying chromosome condition. If the relative had a  nonsyndromic, multifactorial NTD, the risk of recurrence is not expected to be increased for the current pregnancy, given the degree of relation.  If however, the relative had an underlying genetic syndrome, the risk of recurrence depends upon the inheritance of the condition. Further genetic counseling is warranted if more information is obtained. We reviewed screening and testing options for open neural tube defects including AFP screening and targeted ultrasound.    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 No additional relatives were reported with syndactyly. We discussed that syndactyly can occur as an isolated trait or as a feature of an underlying syndrome. Given the reported family history, we discussed that this is most likely an isolated trait for the patient and her mother. We discussed that digit syndactyly can follow autosomal dominant inheritance in some families, where an affected individual has a 1 in 2 (50%) chance for each offspring to also inherit the trait. In the case of an underlying condition or different etiology, recurrence risk estimate may change.  Without further information regarding the provided family history, an accurate genetic risk cannot be calculated. Further genetic counseling is warranted if more information is obtained.  Colleen Terrell denied exposure to environmental toxins or chemical agents. She denied the use of alcohol, tobacco or street drugs. She denied significant viral illnesses during the course of her pregnancy. Her medical and surgical histories were noncontributory.   I counseled Colleen Terrell regarding the above risks and available options.  The approximate face-to-face time with the genetic counselor was 25 minutes.  Quinn Plowman, MS,  Certified Genetic Counselor 03/30/2014

## 2014-03-31 ENCOUNTER — Telehealth: Payer: Self-pay | Admitting: *Deleted

## 2014-03-31 DIAGNOSIS — O2342 Unspecified infection of urinary tract in pregnancy, second trimester: Principal | ICD-10-CM

## 2014-03-31 DIAGNOSIS — B951 Streptococcus, group B, as the cause of diseases classified elsewhere: Secondary | ICD-10-CM

## 2014-03-31 MED ORDER — AMPICILLIN 500 MG PO CAPS: 500.0000 mg | ORAL_CAPSULE | Freq: Three times a day (TID) | ORAL | Status: DC

## 2014-03-31 NOTE — Telephone Encounter (Signed)
Rx sent to pharmacy. Called patient and left message that we have sent medicine to her pharmacy for uti and if she has further questions to call us back.

## 2014-03-31 NOTE — Telephone Encounter (Signed)
Message copied by Mannie StabileASH, Masson Nalepa A on Wed Mar 31, 2014  3:15 PM ------      Message from: Adam PhenixARNOLD, JAMES G      Created: Wed Mar 31, 2014 11:18 AM       GBS urine, needs ampicillin 500 mg TID 7 days ------

## 2014-03-31 NOTE — Progress Notes (Signed)
   Subjective: twins    Colleen Terrell is a [id number]  being seen today for her first obstetrical visit.  Her obstetrical history is significant for twin gestation. Patient does intend to breast feed. Pregnancy history fully reviewed.  Patient reports no complaints.  Filed Vitals:   03/18/14 0906  BP: 113/72  Pulse: 91  Temp: 97.3 F (36.3 C)  Weight: 246 lb 11.2 oz (111.902 kg)    HISTORY: OB History  Gravida Para Term Preterm AB SAB TAB Ectopic Multiple Living  3 2 2       2     # Outcome Date GA Lbr Len/2nd Weight Sex Delivery Anes PTL Lv  3 CUR           2 TRM 02/02/12 7245w1d 03:10 / 00:23 9 lb 1.5 oz (4.125 kg) F SVD Local  Y  1 TRM 10/30/06 8571w0d 24:00 9 lb 3 oz (4.167 kg) M SVD EPI  Y     Past Medical History  Diagnosis Date  . Unspecified hemorrhoids without mention of complication   . History of kidney infection during pregnancy   . PP NVB 02/02/12 02/02/2012   History reviewed. No pertinent past surgical history. Family History  Problem Relation Age of Onset  . Hypertension Father   . Anesthesia problems Neg Hx   . Hypertension Mother      Exam    Uterus:     Pelvic Exam:    Perineum: Normal Perineum   Vulva: normal   Vagina:  normal mucosa   pH:    Cervix: no lesions   Adnexa: normal adnexa   Bony Pelvis: average  System: Breast:  normal appearance, no masses or tenderness   Skin: normal coloration and turgor, no rashes    Neurologic: oriented, normal mood   Extremities: normal strength, tone, and muscle mass   HEENT thyroid without masses   Mouth/Teeth mucous membranes moist, pharynx normal without lesions   Neck supple   Cardiovascular: regular rate and rhythm   Respiratory:  appears well, vitals normal, no respiratory distress, acyanotic, normal RR, neck free of mass or lymphadenopathy, chest clear, no wheezing, crepitations, rhonchi, normal symmetric air entry   Abdomen: soft, non-tender; bowel sounds normal; no masses,  no organomegaly    Urinary: urethral meatus normal      Assessment:    Pregnancy: [id number] Patient Active Problem List   Diagnosis Date Noted  . Advanced maternal age (AMA) in pregnancy 03/30/2014  . Twin gestation in first trimester 03/18/2014  . PP NVB 02/02/12 02/02/2012        Plan:     Initial labs drawn. Prenatal vitamins. Problem list reviewed and updated. Genetic Screening discussed First Screen: requested.  Ultrasound discussed; fetal survey: 18+ weeks.  Follow up in 4 weeks. 50% of 30 min visit spent on counseling and coordination of care.     ARNOLD,JAMES 03/31/2014

## 2014-03-31 NOTE — Addendum Note (Signed)
Addended by: Adam PhenixARNOLD, Elvenia Godden G on: 03/31/2014 11:17 AM   Modules accepted: Kipp BroodSmartSet

## 2014-04-02 ENCOUNTER — Inpatient Hospital Stay (HOSPITAL_COMMUNITY)
Admission: AD | Admit: 2014-04-02 | Discharge: 2014-04-02 | Disposition: A | Discharge status: Home / Self Care | Payer: Medicaid Other | Source: Ambulatory Visit | Attending: Obstetrics & Gynecology | Admitting: Obstetrics & Gynecology

## 2014-04-02 ENCOUNTER — Encounter (HOSPITAL_COMMUNITY): Payer: Self-pay | Admitting: *Deleted

## 2014-04-02 DIAGNOSIS — O26899 Other specified pregnancy related conditions, unspecified trimester: Secondary | ICD-10-CM

## 2014-04-02 DIAGNOSIS — O09529 Supervision of elderly multigravida, unspecified trimester: Secondary | ICD-10-CM | POA: Diagnosis not present

## 2014-04-02 DIAGNOSIS — O210 Mild hyperemesis gravidarum: Secondary | ICD-10-CM | POA: Diagnosis not present

## 2014-04-02 DIAGNOSIS — O30042 Twin pregnancy, dichorionic/diamniotic, second trimester: Secondary | ICD-10-CM

## 2014-04-02 DIAGNOSIS — N949 Unspecified condition associated with female genital organs and menstrual cycle: Secondary | ICD-10-CM | POA: Insufficient documentation

## 2014-04-02 DIAGNOSIS — O30049 Twin pregnancy, dichorionic/diamniotic, unspecified trimester: Secondary | ICD-10-CM | POA: Diagnosis not present

## 2014-04-02 DIAGNOSIS — O162 Unspecified maternal hypertension, second trimester: Secondary | ICD-10-CM

## 2014-04-02 DIAGNOSIS — R109 Unspecified abdominal pain: Secondary | ICD-10-CM | POA: Insufficient documentation

## 2014-04-02 DIAGNOSIS — O99891 Other specified diseases and conditions complicating pregnancy: Secondary | ICD-10-CM | POA: Diagnosis not present

## 2014-04-02 DIAGNOSIS — O219 Vomiting of pregnancy, unspecified: Secondary | ICD-10-CM

## 2014-04-02 DIAGNOSIS — O30009 Twin pregnancy, unspecified number of placenta and unspecified number of amniotic sacs, unspecified trimester: Secondary | ICD-10-CM | POA: Insufficient documentation

## 2014-04-02 DIAGNOSIS — R102 Pelvic and perineal pain: Secondary | ICD-10-CM

## 2014-04-02 DIAGNOSIS — O9989 Other specified diseases and conditions complicating pregnancy, childbirth and the puerperium: Secondary | ICD-10-CM

## 2014-04-02 LAB — URINALYSIS, ROUTINE W REFLEX MICROSCOPIC
Bilirubin Urine: NEGATIVE
Glucose, UA: NEGATIVE mg/dL
HGB URINE DIPSTICK: NEGATIVE
KETONES UR: NEGATIVE mg/dL
Leukocytes, UA: NEGATIVE
Nitrite: NEGATIVE
PROTEIN: NEGATIVE mg/dL
Specific Gravity, Urine: 1.02 (ref 1.005–1.030)
UROBILINOGEN UA: 1 mg/dL (ref 0.0–1.0)
pH: 7 (ref 5.0–8.0)

## 2014-04-02 NOTE — Discharge Instructions (Signed)

## 2014-04-02 NOTE — MAU Provider Note (Signed)
Chief Complaint: Emesis, Abdominal Pain and Back Pain   First Provider Initiated Contact with Patient 04/02/14 1522     SUBJECTIVE HPI: Colleen Terrell is a 33 y.o. G3P2002 at 6868w5d with twin gestation who presents with 1 d hx of lower abdominal and groin crampy intermittent pains that radiate to back. Pain is worse with walking and moving. Denies dysuria, hematuria, frequency or urgency of urination. Denies irritative vaginal discharge. GC/CT, WP neg 2 wks ago. Nausea and some vomiting up to 3 x/day. On Unisom, B6, Phenergan.  [facility]; strep bacteruria, on Amox; nausea and vomiting of pregnancy;  NT scan> normal  Past Medical History  Diagnosis Date  . Unspecified hemorrhoids without mention of complication   . History of kidney infection during pregnancy   . PP NVB 02/02/12 02/02/2012   OB History  Gravida Para Term Preterm AB SAB TAB Ectopic Multiple Living  3 2 2       2     # Outcome Date GA Lbr Len/2nd Weight Sex Delivery Anes PTL Lv  3 CUR           2 TRM 02/02/12 7164w1d 03:10 / 00:23 4.125 kg (9 lb 1.5 oz) F SVD Local  Y  1 TRM 10/30/06 3371w0d 24:00 4.167 kg (9 lb 3 oz) M SVD EPI  Y     History reviewed. No pertinent past surgical history. History   Social History  . Marital Status: Married    Spouse Name: N/A    Number of Children: N/A  . Years of Education: N/A   Occupational History  . Not on file.   Social History Main Topics  . Smoking status: Never Smoker   . Smokeless tobacco: Never Used  . Alcohol Use: No  . Drug Use: No  . Sexual Activity: Not Currently    Birth Control/ Protection: None   Other Topics Concern  . Not on file   Social History Narrative  . No narrative on file   No current facility-administered medications on file prior to encounter.   Current Outpatient Prescriptions on File Prior to Encounter  Medication Sig Dispense Refill  . ampicillin (PRINCIPEN) 500 MG capsule Take 1 capsule (500 mg total) by mouth 3  (three) times daily.  21 capsule  0  . Prenatal Vit-Fe Fumarate-FA (MULTIVITAMIN-PRENATAL) 27-0.8 MG TABS tablet Take 1 tablet by mouth daily.        No Known Allergies  ROS: Pertinent items in HPI  OBJECTIVE Blood pressure 144/80, pulse 100, temperature 99.5 F (37.5 C), temperature source Oral, resp. rate 16, height 5\' 8"  (1.727 m), weight 108.954 kg (240 lb 3.2 oz), last menstrual period 12/27/2013, SpO2 99.00%. GENERAL: Well-developed, well-nourished female in no acute distress.  HEENT: Normocephalic HEART: normal rate RESP: normal effort ABDOMEN: Soft, non-tender, 18 wk size. DT 145 EXTREMITIES: Nontender, no edema NEURO: Alert and oriented  SVE:  L/C/H   LAB RESULTS Results for orders placed during the hospital encounter of 04/02/14 (from the past 24 hour(s))  URINALYSIS, ROUTINE W REFLEX MICROSCOPIC     Status: Abnormal   Collection Time    04/02/14  1:00 PM      Result Value Ref Range   Color, Urine YELLOW  YELLOW   APPearance HAZY (*) CLEAR   Specific Gravity, Urine 1.020  1.005 - 1.030   pH 7.0  5.0 - 8.0   Glucose, UA NEGATIVE  NEGATIVE mg/dL   Hgb urine dipstick NEGATIVE  NEGATIVE  Bilirubin Urine NEGATIVE  NEGATIVE   Ketones, ur NEGATIVE  NEGATIVE mg/dL   Protein, ur NEGATIVE  NEGATIVE mg/dL   Urobilinogen, UA 1.0  0.0 - 1.0 mg/dL   Nitrite NEGATIVE  NEGATIVE   Leukocytes, UA NEGATIVE  NEGATIVE    IMAGING [place] Mfm Fetal Nuchal Translucency  03/30/2014   OBSTETRICAL ULTRASOUND: This exam was performed within a [facility]. The OB US report was generated in the AS system, and faxed to the ordering physician.   This report is available in the [employer]. See the AS Obstetric US report via the Image Link.  [place] Mfm Fetal Nuchal Trans Addl Gest  03/30/2014   OBSTETRICAL ULTRASOUND: This exam was performed within a [facility]. The OB US report was generated in the AS system, and faxed to the ordering physician.   This report  is available in the [employer]. See the AS Obstetric US report via the Image Link.   MAU COURSE  Bedside US by me: normal cardiac activity and movement x2  ASSESSMENT 33 yo G3P2002 at [redacted]w[redacted]d 1. Pain of round ligament affecting pregnancy, antepartum   2. Nausea and vomiting during pregnancy prior to [redacted] weeks gestation   3. Dichorionic diamniotic twin pregnancy in second trimester   4. Elevated blood pressure affecting pregnancy in second trimester, antepartum    PLAN Discharge home with reassurance RLP relief measures. Increase fluids.    Medication List         ampicillin 500 MG capsule  Commonly known as:  PRINCIPEN  Take 1 capsule (500 mg total) by mouth 3 (three) times daily.     doxylamine (Sleep) 25 MG tablet  Commonly known as:  UNISOM  Take 25 mg by mouth 2 (two) times daily as needed (for nausea.).     multivitamin-prenatal 27-0.8 MG Tabs tablet  Take 1 tablet by mouth daily.     promethazine 25 MG tablet  Commonly known as:  PHENERGAN  Take 25 mg by mouth every 6 (six) hours as needed for nausea or vomiting.     pyridoxine 100 MG tablet  Commonly known as:  B-6  Take 100 mg by mouth 2 (two) times daily as needed (for nausea.).       Follow-up Information   Follow up with [facility] On 04/02/2014. (Keep your scheduled prenatal appointment)       Danae Orleans, CNM 04/02/2014  3:40 PM

## 2014-04-02 NOTE — MAU Note (Signed)
Patient states she has been vomiting every day x 5 per day. Has lost weight. States she has been diagnosed with a UTI and has had ampicillin for 3 days 3 times a day. Has has lower abdominal and low back pain since this am. States has a vaginal discharge.

## 2014-04-08 NOTE — MAU Provider Note (Signed)
Attestation of Attending Supervision of Advanced Practitioner (CNM/NP): Evaluation and management procedures were performed by the Advanced Practitioner under my supervision and collaboration. I have reviewed the Advanced Practitioner's note and chart, and I agree with the management and plan.  Sheryl Towell H. 1:16 PM   

## 2014-04-15 ENCOUNTER — Ambulatory Visit (INDEPENDENT_AMBULATORY_CARE_PROVIDER_SITE_OTHER): Payer: Medicaid Other | Admitting: Family Medicine

## 2014-04-15 VITALS — BP 126/72 | HR 86 | Wt 244.3 lb

## 2014-04-15 DIAGNOSIS — O30009 Twin pregnancy, unspecified number of placenta and unspecified number of amniotic sacs, unspecified trimester: Secondary | ICD-10-CM

## 2014-04-15 DIAGNOSIS — O30002 Twin pregnancy, unspecified number of placenta and unspecified number of amniotic sacs, second trimester: Secondary | ICD-10-CM

## 2014-04-15 LAB — POCT URINALYSIS DIP (DEVICE)
Bilirubin Urine: NEGATIVE
Glucose, UA: NEGATIVE mg/dL
HGB URINE DIPSTICK: NEGATIVE
Ketones, ur: NEGATIVE mg/dL
Leukocytes, UA: NEGATIVE
Nitrite: NEGATIVE
PH: 5.5 (ref 5.0–8.0)
PROTEIN: NEGATIVE mg/dL
Specific Gravity, Urine: 1.025 (ref 1.005–1.030)
UROBILINOGEN UA: 0.2 mg/dL (ref 0.0–1.0)

## 2014-04-15 NOTE — Progress Notes (Signed)
Patient is 33 y.o. [id number] 3067w4d.  +FM, denies LOF, VB, contractions, vaginal discharge.  Overall feeling well as nausea/vomiting has resolved.  Has anatomy sono 9/8 scheduled.  VB precautions.  Seen by MFM, declined genetic screening at that time after unable to perform nuchal translucency.

## 2014-04-15 NOTE — Patient Instructions (Signed)
Second Trimester of Pregnancy The second trimester is from week 13 through week 28, month 4 through 6. This is often the time in pregnancy that you feel your best. Often times, morning sickness has lessened or quit. You may have more energy, and you may get hungry more often. Your unborn baby (fetus) is growing rapidly. At the end of the sixth month, he or she is about 9 inches long and weighs about 1 pounds. You will likely feel the baby move (quickening) between 18 and 20 weeks of pregnancy. HOME CARE   Avoid all smoking, herbs, and alcohol. Avoid drugs not approved by your doctor.  Only take medicine as told by your doctor. Some medicines are safe and some are not during pregnancy.  Exercise only as told by your doctor. Stop exercising if you start having cramps.  Eat regular, healthy meals.  Wear a good support bra if your breasts are tender.  Do not use hot tubs, steam rooms, or saunas.  Wear your seat belt when driving.  Avoid raw meat, uncooked cheese, and liter boxes and soil used by cats.  Take your prenatal vitamins.  Try taking medicine that helps you poop (stool softener) as needed, and if your doctor approves. Eat more fiber by eating fresh fruit, vegetables, and whole grains. Drink enough fluids to keep your pee (urine) clear or pale yellow.  Take warm water baths (sitz baths) to soothe pain or discomfort caused by hemorrhoids. Use hemorrhoid cream if your doctor approves.  If you have puffy, bulging veins (varicose veins), wear support hose. Raise (elevate) your feet for 15 minutes, 3-4 times a day. Limit salt in your diet.  Avoid heavy lifting, wear low heals, and sit up straight.  Rest with your legs raised if you have leg cramps or low back pain.  Visit your dentist if you have not gone during your pregnancy. Use a soft toothbrush to brush your teeth. Be gentle when you floss.  You can have sex (intercourse) unless your doctor tells you not to.  Go to your  doctor visits. GET HELP IF:   You feel dizzy.  You have mild cramps or pressure in your lower belly (abdomen).  You have a nagging pain in your belly area.  You continue to feel sick to your stomach (nauseous), throw up (vomit), or have watery poop (diarrhea).  You have bad smelling fluid coming from your vagina.  You have pain with peeing (urination). GET HELP RIGHT AWAY IF:   You have a fever.  You are leaking fluid from your vagina.  You have spotting or bleeding from your vagina.  You have severe belly cramping or pain.  You lose or gain weight rapidly.  You have trouble catching your breath and have chest pain.  You notice sudden or extreme puffiness (swelling) of your face, hands, ankles, feet, or legs.  You have not felt the baby move in over an hour.  You have severe headaches that do not go away with medicine.  You have vision changes. Document Released: 11/07/2009 Document Revised: 12/08/2012 Document Reviewed: 10/14/2012 ExitCare Patient Information 2015 [employer]. This information is not intended to replace advice given to you by your health care provider. Make sure you discuss any questions you have with your health care provider.  

## 2014-04-30 ENCOUNTER — Other Ambulatory Visit: Payer: Self-pay | Admitting: Obstetrics and Gynecology

## 2014-04-30 DIAGNOSIS — O30009 Twin pregnancy, unspecified number of placenta and unspecified number of amniotic sacs, unspecified trimester: Secondary | ICD-10-CM

## 2014-05-04 ENCOUNTER — Other Ambulatory Visit: Payer: Self-pay | Admitting: Obstetrics and Gynecology

## 2014-05-04 ENCOUNTER — Ambulatory Visit (HOSPITAL_COMMUNITY)
Admission: RE | Admit: 2014-05-04 | Discharge: 2014-05-04 | Disposition: A | Discharge status: Home / Self Care | Payer: Medicaid Other | Source: Ambulatory Visit | Attending: Family Medicine | Admitting: Family Medicine

## 2014-05-04 DIAGNOSIS — O30009 Twin pregnancy, unspecified number of placenta and unspecified number of amniotic sacs, unspecified trimester: Secondary | ICD-10-CM

## 2014-05-13 ENCOUNTER — Encounter: Payer: Self-pay | Admitting: Obstetrics and Gynecology

## 2014-05-13 ENCOUNTER — Ambulatory Visit (INDEPENDENT_AMBULATORY_CARE_PROVIDER_SITE_OTHER): Payer: Medicaid Other | Admitting: Obstetrics and Gynecology

## 2014-05-13 VITALS — BP 118/78 | HR 101 | Temp 97.7°F | Wt 250.9 lb

## 2014-05-13 DIAGNOSIS — O09522 Supervision of elderly multigravida, second trimester: Secondary | ICD-10-CM

## 2014-05-13 DIAGNOSIS — Z23 Encounter for immunization: Secondary | ICD-10-CM

## 2014-05-13 DIAGNOSIS — O30001 Twin pregnancy, unspecified number of placenta and unspecified number of amniotic sacs, first trimester: Secondary | ICD-10-CM

## 2014-05-13 DIAGNOSIS — O09529 Supervision of elderly multigravida, unspecified trimester: Secondary | ICD-10-CM

## 2014-05-13 DIAGNOSIS — O30009 Twin pregnancy, unspecified number of placenta and unspecified number of amniotic sacs, unspecified trimester: Secondary | ICD-10-CM

## 2014-05-13 LAB — POCT URINALYSIS DIP (DEVICE)
Bilirubin Urine: NEGATIVE
Glucose, UA: NEGATIVE mg/dL
HGB URINE DIPSTICK: NEGATIVE
Ketones, ur: NEGATIVE mg/dL
Leukocytes, UA: NEGATIVE
NITRITE: NEGATIVE
PROTEIN: 30 mg/dL — AB
Specific Gravity, Urine: 1.015 (ref 1.005–1.030)
UROBILINOGEN UA: 1 mg/dL (ref 0.0–1.0)
pH: 8.5 — ABNORMAL HIGH (ref 5.0–8.0)

## 2014-05-13 MED ORDER — PRENATAL 27-0.8 MG PO TABS: 1.0000 | ORAL_TABLET | Freq: Every day | ORAL | Status: DC

## 2014-05-13 NOTE — Progress Notes (Signed)
Patient is doing well without complaints other than lower back pain. Maternity belt recommended. Patient scheduled for follow up growth 10/6.

## 2014-05-13 NOTE — Progress Notes (Signed)
Pt need prescription for PNV today.  Desires Flu vaccine. Reports lowr back pain.

## 2014-06-01 ENCOUNTER — Ambulatory Visit (HOSPITAL_COMMUNITY)
Admission: RE | Admit: 2014-06-01 | Discharge: 2014-06-01 | Disposition: A | Discharge status: Home / Self Care | Payer: Medicaid Other | Source: Ambulatory Visit | Attending: Obstetrics and Gynecology | Admitting: Obstetrics and Gynecology

## 2014-06-01 ENCOUNTER — Encounter (HOSPITAL_COMMUNITY): Payer: Self-pay

## 2014-06-01 ENCOUNTER — Other Ambulatory Visit (HOSPITAL_COMMUNITY): Payer: Self-pay | Admitting: Maternal and Fetal Medicine

## 2014-06-01 VITALS — BP 142/72 | HR 103 | Wt 259.0 lb

## 2014-06-01 DIAGNOSIS — O30009 Twin pregnancy, unspecified number of placenta and unspecified number of amniotic sacs, unspecified trimester: Secondary | ICD-10-CM

## 2014-06-01 DIAGNOSIS — Z3A00 Weeks of gestation of pregnancy not specified: Secondary | ICD-10-CM | POA: Diagnosis not present

## 2014-06-01 DIAGNOSIS — O09522 Supervision of elderly multigravida, second trimester: Secondary | ICD-10-CM | POA: Insufficient documentation

## 2014-06-01 DIAGNOSIS — O30049 Twin pregnancy, dichorionic/diamniotic, unspecified trimester: Secondary | ICD-10-CM

## 2014-06-01 DIAGNOSIS — O30002 Twin pregnancy, unspecified number of placenta and unspecified number of amniotic sacs, second trimester: Secondary | ICD-10-CM | POA: Diagnosis not present

## 2014-06-10 ENCOUNTER — Ambulatory Visit (INDEPENDENT_AMBULATORY_CARE_PROVIDER_SITE_OTHER): Payer: Medicaid Other | Admitting: Obstetrics & Gynecology

## 2014-06-10 VITALS — BP 128/70 | HR 99 | Temp 98.0°F | Wt 257.1 lb

## 2014-06-10 DIAGNOSIS — O30001 Twin pregnancy, unspecified number of placenta and unspecified number of amniotic sacs, first trimester: Secondary | ICD-10-CM

## 2014-06-10 LAB — POCT URINALYSIS DIP (DEVICE)
Glucose, UA: NEGATIVE mg/dL
Hgb urine dipstick: NEGATIVE
KETONES UR: NEGATIVE mg/dL
Leukocytes, UA: NEGATIVE
NITRITE: NEGATIVE
PH: 8.5 — AB (ref 5.0–8.0)
Protein, ur: 30 mg/dL — AB
Specific Gravity, Urine: 1.015 (ref 1.005–1.030)
Urobilinogen, UA: 1 mg/dL (ref 0.0–1.0)

## 2014-06-10 MED ORDER — PRENATAL 27-0.8 MG PO TABS: 1.0000 | ORAL_TABLET | Freq: Every day | ORAL | Status: DC

## 2014-06-10 NOTE — Progress Notes (Signed)
US 10/6 73 and 76 %ile fu in 3 weeks

## 2014-06-10 NOTE — Progress Notes (Signed)
[facility]

## 2014-06-10 NOTE — Patient Instructions (Signed)
Second Trimester of Pregnancy The second trimester is from week 13 through week 28, months 4 through 6. The second trimester is often a time when you feel your best. Your body has also adjusted to being pregnant, and you begin to feel better physically. Usually, morning sickness has lessened or quit completely, you may have more energy, and you may have an increase in appetite. The second trimester is also a time when the fetus is growing rapidly. At the end of the sixth month, the fetus is about 9 inches long and weighs about 1 pounds. You will likely begin to feel the baby move (quickening) between 18 and 20 weeks of the pregnancy. BODY CHANGES Your body goes through many changes during pregnancy. The changes vary from woman to woman.   Your weight will continue to increase. You will notice your lower abdomen bulging out.  You may begin to get stretch marks on your hips, abdomen, and breasts.  You may develop headaches that can be relieved by medicines approved by your health care provider.  You may urinate more often because the fetus is pressing on your bladder.  You may develop or continue to have heartburn as a result of your pregnancy.  You may develop constipation because certain hormones are causing the muscles that push waste through your intestines to slow down.  You may develop hemorrhoids or swollen, bulging veins (varicose veins).  You may have back pain because of the weight gain and pregnancy hormones relaxing your joints between the bones in your pelvis and as a result of a shift in weight and the muscles that support your balance.  Your breasts will continue to grow and be tender.  Your gums may bleed and may be sensitive to brushing and flossing.  Dark spots or blotches (chloasma, mask of pregnancy) may develop on your face. This will likely fade after the baby is born.  A dark line from your belly button to the pubic area (linea nigra) may appear. This will likely fade  after the baby is born.  You may have changes in your hair. These can include thickening of your hair, rapid growth, and changes in texture. Some women also have hair loss during or after pregnancy, or hair that feels dry or thin. Your hair will most likely return to normal after your baby is born. WHAT TO EXPECT AT YOUR PRENATAL VISITS During a routine prenatal visit:  You will be weighed to make sure you and the fetus are growing normally.  Your blood pressure will be taken.  Your abdomen will be measured to track your baby's growth.  The fetal heartbeat will be listened to.  Any test results from the previous visit will be discussed. Your health care provider may ask you:  How you are feeling.  If you are feeling the baby move.  If you have had any abnormal symptoms, such as leaking fluid, bleeding, severe headaches, or abdominal cramping.  If you have any questions. Other tests that may be performed during your second trimester include:  Blood tests that check for:  Low iron levels (anemia).  Gestational diabetes (between 24 and 28 weeks).  Rh antibodies.  Urine tests to check for infections, diabetes, or protein in the urine.  An ultrasound to confirm the proper growth and development of the baby.  An amniocentesis to check for possible genetic problems.  Fetal screens for spina bifida and Down syndrome. HOME CARE INSTRUCTIONS   Avoid all smoking, herbs, alcohol, and unprescribed   drugs. These chemicals affect the formation and growth of the baby.  Follow your health care provider's instructions regarding medicine use. There are medicines that are either safe or unsafe to take during pregnancy.  Exercise only as directed by your health care provider. Experiencing uterine cramps is a good sign to stop exercising.  Continue to eat regular, healthy meals.  Wear a good support bra for breast tenderness.  Do not use hot tubs, steam rooms, or saunas.  Wear your  seat belt at all times when driving.  Avoid raw meat, uncooked cheese, cat litter boxes, and soil used by cats. These carry germs that can cause birth defects in the baby.  Take your prenatal vitamins.  Try taking a stool softener (if your health care provider approves) if you develop constipation. Eat more high-fiber foods, such as fresh vegetables or fruit and whole grains. Drink plenty of fluids to keep your urine clear or pale yellow.  Take warm sitz baths to soothe any pain or discomfort caused by hemorrhoids. Use hemorrhoid cream if your health care provider approves.  If you develop varicose veins, wear support hose. Elevate your feet for 15 minutes, 3-4 times a day. Limit salt in your diet.  Avoid heavy lifting, wear low heel shoes, and practice good posture.  Rest with your legs elevated if you have leg cramps or low back pain.  Visit your dentist if you have not gone yet during your pregnancy. Use a soft toothbrush to brush your teeth and be gentle when you floss.  A sexual relationship may be continued unless your health care provider directs you otherwise.  Continue to go to all your prenatal visits as directed by your health care provider. SEEK MEDICAL CARE IF:   You have dizziness.  You have mild pelvic cramps, pelvic pressure, or nagging pain in the abdominal area.  You have persistent nausea, vomiting, or diarrhea.  You have a bad smelling vaginal discharge.  You have pain with urination. SEEK IMMEDIATE MEDICAL CARE IF:   You have a fever.  You are leaking fluid from your vagina.  You have spotting or bleeding from your vagina.  You have severe abdominal cramping or pain.  You have rapid weight gain or loss.  You have shortness of breath with chest pain.  You notice sudden or extreme swelling of your face, hands, ankles, feet, or legs.  You have not felt your baby move in over an hour.  You have severe headaches that do not go away with  medicine.  You have vision changes. Document Released: 08/07/2001 Document Revised: 08/18/2013 Document Reviewed: 10/14/2012 Exit[employer] Patient Information 2015 [employer]. This information is not intended to replace advice given to you by your health care provider. Make sure you discuss any questions you have with your health care provider.  

## 2014-06-28 ENCOUNTER — Encounter (HOSPITAL_COMMUNITY): Payer: Self-pay

## 2014-06-29 ENCOUNTER — Ambulatory Visit (HOSPITAL_COMMUNITY)
Admission: RE | Admit: 2014-06-29 | Discharge: 2014-06-29 | Disposition: A | Discharge status: Home / Self Care | Payer: Medicaid Other | Source: Ambulatory Visit | Attending: Obstetrics & Gynecology | Admitting: Obstetrics & Gynecology

## 2014-06-29 ENCOUNTER — Encounter (HOSPITAL_COMMUNITY): Payer: Self-pay

## 2014-06-29 ENCOUNTER — Other Ambulatory Visit (HOSPITAL_COMMUNITY): Payer: Self-pay | Admitting: Maternal and Fetal Medicine

## 2014-06-29 DIAGNOSIS — O09522 Supervision of elderly multigravida, second trimester: Secondary | ICD-10-CM

## 2014-06-29 DIAGNOSIS — Z3A26 26 weeks gestation of pregnancy: Secondary | ICD-10-CM | POA: Diagnosis not present

## 2014-06-29 DIAGNOSIS — O30049 Twin pregnancy, dichorionic/diamniotic, unspecified trimester: Secondary | ICD-10-CM

## 2014-06-29 DIAGNOSIS — O30042 Twin pregnancy, dichorionic/diamniotic, second trimester: Secondary | ICD-10-CM | POA: Insufficient documentation

## 2014-06-29 DIAGNOSIS — O30001 Twin pregnancy, unspecified number of placenta and unspecified number of amniotic sacs, first trimester: Secondary | ICD-10-CM

## 2014-06-29 DIAGNOSIS — O09529 Supervision of elderly multigravida, unspecified trimester: Secondary | ICD-10-CM | POA: Insufficient documentation

## 2014-07-08 ENCOUNTER — Ambulatory Visit (INDEPENDENT_AMBULATORY_CARE_PROVIDER_SITE_OTHER): Payer: Medicaid Other | Admitting: Obstetrics & Gynecology

## 2014-07-08 VITALS — BP 127/73 | HR 108 | Temp 98.3°F | Wt 257.6 lb

## 2014-07-08 DIAGNOSIS — Z23 Encounter for immunization: Secondary | ICD-10-CM

## 2014-07-08 DIAGNOSIS — O30001 Twin pregnancy, unspecified number of placenta and unspecified number of amniotic sacs, first trimester: Secondary | ICD-10-CM

## 2014-07-08 LAB — POCT URINALYSIS DIP (DEVICE)
Glucose, UA: NEGATIVE mg/dL
Hgb urine dipstick: NEGATIVE
KETONES UR: NEGATIVE mg/dL
LEUKOCYTES UA: NEGATIVE
Nitrite: NEGATIVE
PH: 7 (ref 5.0–8.0)
Protein, ur: 30 mg/dL — AB
Specific Gravity, Urine: 1.025 (ref 1.005–1.030)
Urobilinogen, UA: 1 mg/dL (ref 0.0–1.0)

## 2014-07-08 LAB — CBC
HCT: 33.7 % — ABNORMAL LOW (ref 36.0–46.0)
HEMOGLOBIN: 11.7 g/dL — AB (ref 12.0–15.0)
MCH: 29.5 pg (ref 26.0–34.0)
MCHC: 34.7 g/dL (ref 30.0–36.0)
MCV: 85.1 fL (ref 78.0–100.0)
Platelets: 237 10*3/uL (ref 150–400)
RBC: 3.96 MIL/uL (ref 3.87–5.11)
RDW: 15 % (ref 11.5–15.5)
WBC: 8.2 10*3/uL (ref 4.0–10.5)

## 2014-07-08 MED ORDER — TETANUS-DIPHTH-ACELL PERTUSSIS 5-2.5-18.5 LF-MCG/0.5 IM SUSP
0.5000 mL | Freq: Once | INTRAMUSCULAR | Status: AC
  Administered 2014-07-08: 0.5 mL via INTRAMUSCULAR

## 2014-07-08 NOTE — Progress Notes (Signed)
Routine visit. Good FM x 2. Ths still has lower back pain. I recommended a maternity belt as did Dr. Jolayne Pantheronstant 9/15. She denies VB, ROM, or contractions. She has another u/s 07-27-14.

## 2014-07-08 NOTE — Progress Notes (Signed)
Patient reports pain between her legs and lower back pain.

## 2014-07-09 LAB — RPR

## 2014-07-09 LAB — HIV ANTIBODY (ROUTINE TESTING W REFLEX): HIV 1&2 Ab, 4th Generation: NONREACTIVE

## 2014-07-09 LAB — GLUCOSE TOLERANCE, 1 HOUR (50G) W/O FASTING: Glucose, 1 Hour GTT: 108 mg/dL (ref 70–140)

## 2014-07-21 ENCOUNTER — Ambulatory Visit (INDEPENDENT_AMBULATORY_CARE_PROVIDER_SITE_OTHER): Payer: Medicaid Other | Admitting: Obstetrics & Gynecology

## 2014-07-21 VITALS — BP 144/75 | HR 102 | Temp 99.2°F | Wt 262.8 lb

## 2014-07-21 DIAGNOSIS — O30043 Twin pregnancy, dichorionic/diamniotic, third trimester: Secondary | ICD-10-CM

## 2014-07-21 LAB — POCT URINALYSIS DIP (DEVICE)
Bilirubin Urine: NEGATIVE
Glucose, UA: NEGATIVE mg/dL
Hgb urine dipstick: NEGATIVE
Ketones, ur: NEGATIVE mg/dL
Leukocytes, UA: NEGATIVE
Nitrite: NEGATIVE
Protein, ur: NEGATIVE mg/dL
Specific Gravity, Urine: 1.025 (ref 1.005–1.030)
Urobilinogen, UA: 0.2 mg/dL (ref 0.0–1.0)
pH: 7 (ref 5.0–8.0)

## 2014-07-21 NOTE — Patient Instructions (Signed)

## 2014-07-21 NOTE — Progress Notes (Signed)
BP elevated today, no symptoms of preeclampsia.  No proteinuria.  Will monitor closely.  Preeclampsia precautions reviewed. Reports pain in lower back.  Recommended Tylenol, maternity belt. If needed later, consider Flexeril. Next ultrasound 07/27/14.  No other complaints or concerns.  Labor and fetal movement precautions reviewed.

## 2014-07-27 ENCOUNTER — Ambulatory Visit (HOSPITAL_COMMUNITY)
Admission: RE | Admit: 2014-07-27 | Discharge: 2014-07-27 | Disposition: A | Discharge status: Home / Self Care | Payer: Medicaid Other | Source: Ambulatory Visit | Attending: Obstetrics & Gynecology | Admitting: Obstetrics & Gynecology

## 2014-07-27 ENCOUNTER — Other Ambulatory Visit (HOSPITAL_COMMUNITY): Payer: Self-pay | Admitting: Maternal and Fetal Medicine

## 2014-07-27 ENCOUNTER — Encounter (HOSPITAL_COMMUNITY): Payer: Self-pay

## 2014-07-27 DIAGNOSIS — O09522 Supervision of elderly multigravida, second trimester: Secondary | ICD-10-CM

## 2014-07-27 DIAGNOSIS — O30049 Twin pregnancy, dichorionic/diamniotic, unspecified trimester: Secondary | ICD-10-CM

## 2014-07-27 DIAGNOSIS — Z3A30 30 weeks gestation of pregnancy: Secondary | ICD-10-CM | POA: Insufficient documentation

## 2014-07-27 DIAGNOSIS — O30043 Twin pregnancy, dichorionic/diamniotic, third trimester: Secondary | ICD-10-CM | POA: Insufficient documentation

## 2014-07-27 DIAGNOSIS — O09523 Supervision of elderly multigravida, third trimester: Secondary | ICD-10-CM | POA: Insufficient documentation

## 2014-07-27 NOTE — ED Notes (Signed)
Pt having lower pelvic pressure and SOB this am.  SP O2 100%.

## 2014-07-29 ENCOUNTER — Ambulatory Visit (INDEPENDENT_AMBULATORY_CARE_PROVIDER_SITE_OTHER): Payer: Medicaid Other | Admitting: Obstetrics and Gynecology

## 2014-07-29 ENCOUNTER — Encounter: Payer: Self-pay | Admitting: Obstetrics and Gynecology

## 2014-07-29 VITALS — BP 122/69 | HR 96 | Temp 97.8°F | Wt 262.9 lb

## 2014-07-29 DIAGNOSIS — O09523 Supervision of elderly multigravida, third trimester: Secondary | ICD-10-CM

## 2014-07-29 DIAGNOSIS — O30043 Twin pregnancy, dichorionic/diamniotic, third trimester: Secondary | ICD-10-CM

## 2014-07-29 LAB — POCT URINALYSIS DIP (DEVICE)
Bilirubin Urine: NEGATIVE
Glucose, UA: NEGATIVE mg/dL
Hgb urine dipstick: NEGATIVE
Ketones, ur: NEGATIVE mg/dL
Leukocytes, UA: NEGATIVE
NITRITE: NEGATIVE
PROTEIN: NEGATIVE mg/dL
Specific Gravity, Urine: 1.015 (ref 1.005–1.030)
Urobilinogen, UA: 0.2 mg/dL (ref 0.0–1.0)
pH: 7.5 (ref 5.0–8.0)

## 2014-07-29 NOTE — Patient Instructions (Signed)

## 2014-07-29 NOTE — Progress Notes (Signed)
C/o of a lot of lower back pain, making it hard to walk. Denies contractions.

## 2014-07-29 NOTE — Progress Notes (Signed)
Patient is doing well with persistent lower back pain. She states that the maternity support belt is not helping. Advised to continue using the belt. Add back exercises. Tylenol and warm compresses as needed to help relax the muscles. FM/PTL precautions reviewed 12/1 EFW 79%tile and 65%tile F/U growth ultrasound scheduled for 08/23/2014

## 2014-08-12 ENCOUNTER — Encounter: Payer: Self-pay | Admitting: Obstetrics & Gynecology

## 2014-08-12 ENCOUNTER — Ambulatory Visit (INDEPENDENT_AMBULATORY_CARE_PROVIDER_SITE_OTHER): Payer: Medicaid Other | Admitting: Obstetrics & Gynecology

## 2014-08-12 VITALS — BP 135/77 | HR 108 | Temp 98.7°F | Wt 265.8 lb

## 2014-08-12 DIAGNOSIS — O30049 Twin pregnancy, dichorionic/diamniotic, unspecified trimester: Secondary | ICD-10-CM

## 2014-08-12 DIAGNOSIS — O30043 Twin pregnancy, dichorionic/diamniotic, third trimester: Secondary | ICD-10-CM

## 2014-08-12 LAB — POCT URINALYSIS DIP (DEVICE)
Bilirubin Urine: NEGATIVE
Glucose, UA: NEGATIVE mg/dL
Hgb urine dipstick: NEGATIVE
Ketones, ur: NEGATIVE mg/dL
LEUKOCYTES UA: NEGATIVE
Nitrite: NEGATIVE
PROTEIN: NEGATIVE mg/dL
Specific Gravity, Urine: 1.02 (ref 1.005–1.030)
Urobilinogen, UA: 1 mg/dL (ref 0.0–1.0)
pH: 7 (ref 5.0–8.0)

## 2014-08-12 NOTE — Progress Notes (Signed)
Patient reports a lot of pelvic pressure and pain as well as a lot of lower back pain

## 2014-08-12 NOTE — Patient Instructions (Signed)
Multiple Pregnancy A multiple pregnancy is when a woman is pregnant with two or more fetuses. Multiple pregnancies occur in about 3% of all births. The more babies in a pregnancy, the greater the risks of problems to the babies and mother. This includes death. Since the use of Assisted Reproductive Technology (ART) and medications that can induce ovulation, multiple fetal gestation has increased.  RISKS TO THE MOTHER  Preeclampsia and eclampsia.  Postpartum bleeding (hemorrhage).  Kidney infection (pyelonephritis).  Develop anemia.  Develop diabetes.  Liver complications.  A blood clot blocks the artery, or branch of the artery leading to the lungs (pulmonary embolism).  Blood clots in the leg.  Placental separation.  Higher rate of Cesarean Section deliveries.  Women over 35 years old have a higher rate of Downs Syndrome babies. RISKS TO THE BABY  Preterm labor with a premature baby.  Very low birth weight babies that are less than 3 pounds, especially with triplets or mores.  Premature rupture of the membranes.  Twin to twin blood transfusion with one baby anemic and the other baby with too much blood in its system. There may also be heart failure.  With triplets or more, one of the babies is at high risk for cerebral palsy or other neurologic problem.  There is a higher incidence of fetal death. CARE OF MOTHERS WITH MULTIPLE FETAL GESTATION Multiple pregnancies need more care and special prenatal care.  You will see your caregiver more often.  You will have more tests including ultrasounds, nonstress tests and blood tests.  You will have special tests done called amniocentesis and a biophysical profile.  You may be hospitalized more often during the pregnancy.  You will be encouraged to eat a balanced and healthy diet with vitamin and mineral supplements as directed.  You will be asked to get more rest and sleep to keep up your energy.  You will be asked to  restrict your daily activities, exercise, work, household chores and sexual activity.  If you have preterm labor with small babies, you will be given a steroid injection to help the babies lungs mature and do better when born.  The delivery may have to be by Cesarean delivery, especially if there are triplets or more.  The delivery should be in a hospital with an intensive care nursery and Neonatologists (pediatrician for high risk babies) to care for the newborn babies. HOME CARE INSTRUCTIONS   Follow the caregiver's recommendations regarding office visits, tests for you and the babies, diet, rest and medications.  Avoid a large amount of physical activity.  Arrange to have help after the babies are born and when you go home from the hospital.  Take classes on how to care for multiple babies before you deliver them. SEEK IMMEDIATE MEDICAL CARE IF:   You develop a temperature of 102 F (38.9 C) or higher.  You are leaking fluid from the vagina.  You develop vaginal bleeding.  You develop uterine contractions.  You develop a severe headache, severe upper abdominal pain, visual problems or excessive swelling of your face, hands and feet.  You develop severe back pain or leg pain.  You develop severe tiredness.  You develop chest pain.  You have shortness of breath, fall down or pass out. Document Released: 05/22/2008 Document Revised: 11/05/2011 Document Reviewed: 07/17/2013 ExitCare Patient Information 2015 [employer]. This information is not intended to replace advice given to you by your health care provider. Make sure you discuss any questions you have with   your health care provider.  

## 2014-08-12 NOTE — Progress Notes (Signed)
US growth appropriate. 65 and79 %ile

## 2014-08-18 ENCOUNTER — Ambulatory Visit (INDEPENDENT_AMBULATORY_CARE_PROVIDER_SITE_OTHER): Payer: Medicaid Other | Admitting: Obstetrics and Gynecology

## 2014-08-18 ENCOUNTER — Encounter: Payer: Self-pay | Admitting: Obstetrics and Gynecology

## 2014-08-18 VITALS — BP 134/86 | HR 104 | Wt 265.4 lb

## 2014-08-18 DIAGNOSIS — O09523 Supervision of elderly multigravida, third trimester: Secondary | ICD-10-CM

## 2014-08-18 DIAGNOSIS — O30043 Twin pregnancy, dichorionic/diamniotic, third trimester: Secondary | ICD-10-CM

## 2014-08-18 LAB — POCT URINALYSIS DIP (DEVICE)
Bilirubin Urine: NEGATIVE
Glucose, UA: NEGATIVE mg/dL
Hgb urine dipstick: NEGATIVE
Ketones, ur: NEGATIVE mg/dL
Leukocytes, UA: NEGATIVE
NITRITE: NEGATIVE
PROTEIN: NEGATIVE mg/dL
Specific Gravity, Urine: 1.015 (ref 1.005–1.030)
Urobilinogen, UA: 0.2 mg/dL (ref 0.0–1.0)
pH: 7 (ref 5.0–8.0)

## 2014-08-18 NOTE — Progress Notes (Signed)
Patient is doing well without complaints. FM/PTL precautions reviewed. Follow up growth ultrasound on 12/29

## 2014-08-18 NOTE — Progress Notes (Signed)
Pt denies H/A or visual disturbances.  

## 2014-08-24 ENCOUNTER — Ambulatory Visit (HOSPITAL_COMMUNITY)
Admission: RE | Admit: 2014-08-24 | Discharge: 2014-08-24 | Disposition: A | Discharge status: Home / Self Care | Payer: Medicaid Other | Source: Ambulatory Visit | Attending: Obstetrics & Gynecology | Admitting: Obstetrics & Gynecology

## 2014-08-24 ENCOUNTER — Encounter (HOSPITAL_COMMUNITY): Payer: Self-pay

## 2014-08-24 ENCOUNTER — Other Ambulatory Visit (HOSPITAL_COMMUNITY): Payer: Self-pay | Admitting: Maternal and Fetal Medicine

## 2014-08-24 DIAGNOSIS — O09523 Supervision of elderly multigravida, third trimester: Secondary | ICD-10-CM | POA: Insufficient documentation

## 2014-08-24 DIAGNOSIS — O09522 Supervision of elderly multigravida, second trimester: Secondary | ICD-10-CM

## 2014-08-24 DIAGNOSIS — O30049 Twin pregnancy, dichorionic/diamniotic, unspecified trimester: Secondary | ICD-10-CM

## 2014-08-24 DIAGNOSIS — O30043 Twin pregnancy, dichorionic/diamniotic, third trimester: Secondary | ICD-10-CM | POA: Insufficient documentation

## 2014-08-24 DIAGNOSIS — O321XX0 Maternal care for breech presentation, not applicable or unspecified: Secondary | ICD-10-CM | POA: Insufficient documentation

## 2014-08-24 DIAGNOSIS — Z3A34 34 weeks gestation of pregnancy: Secondary | ICD-10-CM | POA: Diagnosis not present

## 2014-08-24 DIAGNOSIS — O09529 Supervision of elderly multigravida, unspecified trimester: Secondary | ICD-10-CM | POA: Insufficient documentation

## 2014-08-26 ENCOUNTER — Ambulatory Visit (INDEPENDENT_AMBULATORY_CARE_PROVIDER_SITE_OTHER): Payer: Medicaid Other | Admitting: Obstetrics and Gynecology

## 2014-08-26 VITALS — BP 139/83 | HR 100 | Temp 98.4°F | Wt 271.1 lb

## 2014-08-26 DIAGNOSIS — O30043 Twin pregnancy, dichorionic/diamniotic, third trimester: Secondary | ICD-10-CM

## 2014-08-26 LAB — POCT URINALYSIS DIP (DEVICE)
Bilirubin Urine: NEGATIVE
Glucose, UA: NEGATIVE mg/dL
Hgb urine dipstick: NEGATIVE
Ketones, ur: NEGATIVE mg/dL
Leukocytes, UA: NEGATIVE
Nitrite: NEGATIVE
PH: 6 (ref 5.0–8.0)
Protein, ur: NEGATIVE mg/dL
Specific Gravity, Urine: 1.02 (ref 1.005–1.030)
Urobilinogen, UA: 1 mg/dL (ref 0.0–1.0)

## 2014-08-26 NOTE — Progress Notes (Signed)
Mild cramping at night; no regular UCs. Good FM. No H/A, epigastric pain. US at 34-2 breech/breech, 80th and 70th%ile growth. Twin A left. Start NSTs and do cultures next week. Watch BP. PTL precautions.

## 2014-08-26 NOTE — Progress Notes (Signed)
C/o starting having  Contractions mostly at night for 2 days now.

## 2014-08-26 NOTE — Patient Instructions (Signed)

## 2014-08-30 ENCOUNTER — Encounter: Payer: Self-pay | Admitting: *Deleted

## 2014-09-02 ENCOUNTER — Encounter: Payer: Self-pay | Admitting: Obstetrics & Gynecology

## 2014-09-02 ENCOUNTER — Encounter: Payer: Medicaid Other | Admitting: Obstetrics and Gynecology

## 2014-09-02 ENCOUNTER — Ambulatory Visit (INDEPENDENT_AMBULATORY_CARE_PROVIDER_SITE_OTHER): Payer: Medicaid Other | Admitting: Family

## 2014-09-02 VITALS — BP 143/84 | HR 104 | Temp 98.4°F | Wt 271.1 lb

## 2014-09-02 DIAGNOSIS — O163 Unspecified maternal hypertension, third trimester: Secondary | ICD-10-CM

## 2014-09-02 DIAGNOSIS — O30049 Twin pregnancy, dichorionic/diamniotic, unspecified trimester: Secondary | ICD-10-CM

## 2014-09-02 DIAGNOSIS — O133 Gestational [pregnancy-induced] hypertension without significant proteinuria, third trimester: Secondary | ICD-10-CM

## 2014-09-02 LAB — POCT URINALYSIS DIP (DEVICE)
Glucose, UA: NEGATIVE mg/dL
HGB URINE DIPSTICK: NEGATIVE
KETONES UR: NEGATIVE mg/dL
Leukocytes, UA: NEGATIVE
NITRITE: NEGATIVE
Protein, ur: 30 mg/dL — AB
Specific Gravity, Urine: 1.02 (ref 1.005–1.030)
UROBILINOGEN UA: 1 mg/dL (ref 0.0–1.0)
pH: 6.5 (ref 5.0–8.0)

## 2014-09-02 LAB — COMPREHENSIVE METABOLIC PANEL
ALBUMIN: 3 g/dL — AB (ref 3.5–5.2)
ALT: 19 U/L (ref 0–35)
AST: 25 U/L (ref 0–37)
Alkaline Phosphatase: 159 U/L — ABNORMAL HIGH (ref 39–117)
BUN: 9 mg/dL (ref 6–23)
CALCIUM: 8.7 mg/dL (ref 8.4–10.5)
CHLORIDE: 103 meq/L (ref 96–112)
CO2: 25 mEq/L (ref 19–32)
CREATININE: 0.67 mg/dL (ref 0.50–1.10)
Glucose, Bld: 90 mg/dL (ref 70–99)
Potassium: 4.2 mEq/L (ref 3.5–5.3)
Sodium: 135 mEq/L (ref 135–145)
Total Bilirubin: 0.4 mg/dL (ref 0.2–1.2)
Total Protein: 5.9 g/dL — ABNORMAL LOW (ref 6.0–8.3)

## 2014-09-02 MED ORDER — HYDROCORTISONE ACE-PRAMOXINE 1-1 % RE FOAM: 1.0000 | Freq: Two times a day (BID) | RECTAL | Status: DC

## 2014-09-02 MED ORDER — CYCLOBENZAPRINE HCL 10 MG PO TABS: 10.0000 mg | ORAL_TABLET | Freq: Every day | ORAL | Status: DC

## 2014-09-02 NOTE — Addendum Note (Signed)
Addended by: Candelaria StagersHAIZLIP, Clarisse Rodriges E on: 09/02/2014 09:18 AM   Modules accepted: Orders, Level of Service

## 2014-09-02 NOTE — Progress Notes (Signed)
Pt reports increased back pain, especially with walking and turning in bed. RX Flexeril.  Also report hemorrhoids, not bleeding.  RX Proctofoam.

## 2014-09-02 NOTE — Progress Notes (Signed)
NST Reactive

## 2014-09-02 NOTE — Addendum Note (Signed)
Addended by: Marlis EdelsonKARIM, Amariana Mirando N on: 09/02/2014 08:23 AM   Modules accepted: Orders

## 2014-09-02 NOTE — Progress Notes (Signed)
C/o of lower back pain that radiates down legs.

## 2014-09-02 NOTE — Progress Notes (Signed)
Denies headache, vision changes, or epigastric pain.  Reviewed growth ultrasound twin A 80%ile twin B 70%ile.  Obtain baseline prex labs.  Start NST today.

## 2014-09-03 LAB — CBC
HEMATOCRIT: 37.2 % (ref 36.0–46.0)
Hemoglobin: 12.4 g/dL (ref 12.0–15.0)
MCH: 29.1 pg (ref 26.0–34.0)
MCHC: 33.3 g/dL (ref 30.0–36.0)
MCV: 87.3 fL (ref 78.0–100.0)
MPV: 12 fL (ref 8.6–12.4)
Platelets: 210 10*3/uL (ref 150–400)
RBC: 4.26 MIL/uL (ref 3.87–5.11)
RDW: 14.4 % (ref 11.5–15.5)
WBC: 6.6 10*3/uL (ref 4.0–10.5)

## 2014-09-03 LAB — PROTEIN / CREATININE RATIO, URINE
Creatinine, Urine: 260.9 mg/dL
Protein Creatinine Ratio: 0.11 (ref <0.15)
Total Protein, Urine: 28 mg/dL — ABNORMAL HIGH (ref 5–24)

## 2014-09-06 ENCOUNTER — Ambulatory Visit (INDEPENDENT_AMBULATORY_CARE_PROVIDER_SITE_OTHER): Payer: Medicaid Other | Admitting: *Deleted

## 2014-09-06 VITALS — BP 136/83 | HR 102

## 2014-09-06 DIAGNOSIS — O30043 Twin pregnancy, dichorionic/diamniotic, third trimester: Secondary | ICD-10-CM

## 2014-09-06 NOTE — Progress Notes (Signed)
NST - both babies reactive

## 2014-09-06 NOTE — Progress Notes (Signed)
NST

## 2014-09-09 ENCOUNTER — Ambulatory Visit (INDEPENDENT_AMBULATORY_CARE_PROVIDER_SITE_OTHER): Payer: Medicaid Other | Admitting: Family Medicine

## 2014-09-09 VITALS — BP 156/90 | HR 96 | Wt 273.6 lb

## 2014-09-09 DIAGNOSIS — O30043 Twin pregnancy, dichorionic/diamniotic, third trimester: Secondary | ICD-10-CM

## 2014-09-09 DIAGNOSIS — N390 Urinary tract infection, site not specified: Secondary | ICD-10-CM

## 2014-09-09 DIAGNOSIS — B951 Streptococcus, group B, as the cause of diseases classified elsewhere: Secondary | ICD-10-CM

## 2014-09-09 DIAGNOSIS — R8271 Bacteriuria: Secondary | ICD-10-CM | POA: Insufficient documentation

## 2014-09-09 DIAGNOSIS — O133 Gestational [pregnancy-induced] hypertension without significant proteinuria, third trimester: Secondary | ICD-10-CM

## 2014-09-09 DIAGNOSIS — O30049 Twin pregnancy, dichorionic/diamniotic, unspecified trimester: Secondary | ICD-10-CM

## 2014-09-09 LAB — OB RESULTS CONSOLE GC/CHLAMYDIA
Chlamydia: NEGATIVE
Gonorrhea: NEGATIVE

## 2014-09-09 LAB — POCT URINALYSIS DIP (DEVICE)
Bilirubin Urine: NEGATIVE
Glucose, UA: NEGATIVE mg/dL
Hgb urine dipstick: NEGATIVE
Nitrite: NEGATIVE
PROTEIN: NEGATIVE mg/dL
Specific Gravity, Urine: 1.025 (ref 1.005–1.030)
Urobilinogen, UA: 0.2 mg/dL (ref 0.0–1.0)
pH: 6.5 (ref 5.0–8.0)

## 2014-09-09 NOTE — Progress Notes (Signed)
NSTs reactive for both babies. Has GHTN.  Discussed with Dr Claudean SeveranceWhitecar - recommended delivery at 37 weeks. US in office showed breech/vtx Scheduled c-section on Monday 09/13/14, although patient may need to change the date Discussed recommendation to deliver as soon as possible after 37 weeks. GC/CT today.

## 2014-09-10 LAB — GC/CHLAMYDIA PROBE AMP
CT Probe RNA: NEGATIVE
GC Probe RNA: NEGATIVE

## 2014-09-13 ENCOUNTER — Encounter (HOSPITAL_COMMUNITY): Payer: Self-pay | Admitting: *Deleted

## 2014-09-13 ENCOUNTER — Encounter (HOSPITAL_COMMUNITY)
Admission: AD | Disposition: A | Discharge status: Home / Self Care | Payer: Self-pay | Source: Ambulatory Visit | Attending: Family Medicine

## 2014-09-13 ENCOUNTER — Inpatient Hospital Stay (HOSPITAL_COMMUNITY): Admission: RE | Admit: 2014-09-13 | Payer: Medicaid Other | Source: Ambulatory Visit | Admitting: Family Medicine

## 2014-09-13 ENCOUNTER — Inpatient Hospital Stay (HOSPITAL_COMMUNITY): Payer: Medicaid Other | Admitting: Anesthesiology

## 2014-09-13 ENCOUNTER — Ambulatory Visit (INDEPENDENT_AMBULATORY_CARE_PROVIDER_SITE_OTHER): Payer: Medicaid Other | Admitting: Obstetrics and Gynecology

## 2014-09-13 ENCOUNTER — Inpatient Hospital Stay (HOSPITAL_COMMUNITY)
Admission: AD | Admit: 2014-09-13 | Discharge: 2014-09-16 | DRG: 765 | Disposition: A | Discharge status: Home / Self Care | Payer: Medicaid Other | Source: Ambulatory Visit | Attending: Family Medicine | Admitting: Family Medicine

## 2014-09-13 ENCOUNTER — Encounter: Payer: Self-pay | Admitting: Obstetrics and Gynecology

## 2014-09-13 VITALS — BP 174/97 | HR 110

## 2014-09-13 DIAGNOSIS — Z3A37 37 weeks gestation of pregnancy: Secondary | ICD-10-CM | POA: Diagnosis present

## 2014-09-13 DIAGNOSIS — O1413 Severe pre-eclampsia, third trimester: Secondary | ICD-10-CM | POA: Diagnosis present

## 2014-09-13 DIAGNOSIS — O09523 Supervision of elderly multigravida, third trimester: Secondary | ICD-10-CM

## 2014-09-13 DIAGNOSIS — O30043 Twin pregnancy, dichorionic/diamniotic, third trimester: Secondary | ICD-10-CM

## 2014-09-13 DIAGNOSIS — O99824 Streptococcus B carrier state complicating childbirth: Secondary | ICD-10-CM | POA: Diagnosis present

## 2014-09-13 DIAGNOSIS — B951 Streptococcus, group B, as the cause of diseases classified elsewhere: Secondary | ICD-10-CM

## 2014-09-13 DIAGNOSIS — R8271 Bacteriuria: Secondary | ICD-10-CM

## 2014-09-13 DIAGNOSIS — Z3483 Encounter for supervision of other normal pregnancy, third trimester: Secondary | ICD-10-CM | POA: Diagnosis present

## 2014-09-13 DIAGNOSIS — O133 Gestational [pregnancy-induced] hypertension without significant proteinuria, third trimester: Secondary | ICD-10-CM

## 2014-09-13 DIAGNOSIS — O139 Gestational [pregnancy-induced] hypertension without significant proteinuria, unspecified trimester: Secondary | ICD-10-CM | POA: Diagnosis present

## 2014-09-13 DIAGNOSIS — Z3A34 34 weeks gestation of pregnancy: Secondary | ICD-10-CM

## 2014-09-13 DIAGNOSIS — O30049 Twin pregnancy, dichorionic/diamniotic, unspecified trimester: Secondary | ICD-10-CM

## 2014-09-13 DIAGNOSIS — O321XX1 Maternal care for breech presentation, fetus 1: Secondary | ICD-10-CM | POA: Diagnosis present

## 2014-09-13 DIAGNOSIS — N390 Urinary tract infection, site not specified: Secondary | ICD-10-CM

## 2014-09-13 HISTORY — DX: Gastro-esophageal reflux disease without esophagitis: K21.9

## 2014-09-13 LAB — COMPREHENSIVE METABOLIC PANEL
ALK PHOS: 213 U/L — AB (ref 39–117)
ALT: 23 U/L (ref 0–35)
AST: 29 U/L (ref 0–37)
Albumin: 2.7 g/dL — ABNORMAL LOW (ref 3.5–5.2)
Anion gap: 7 (ref 5–15)
BILIRUBIN TOTAL: 0.4 mg/dL (ref 0.3–1.2)
BUN: 9 mg/dL (ref 6–23)
CALCIUM: 8.6 mg/dL (ref 8.4–10.5)
CHLORIDE: 105 meq/L (ref 96–112)
CO2: 21 mmol/L (ref 19–32)
Creatinine, Ser: 0.62 mg/dL (ref 0.50–1.10)
GFR calc non Af Amer: >90 mL/min (ref >90)
GLUCOSE: 67 mg/dL — AB (ref 70–99)
POTASSIUM: 4.5 mmol/L (ref 3.5–5.1)
Sodium: 133 mmol/L — ABNORMAL LOW (ref 135–145)
TOTAL PROTEIN: 6.4 g/dL (ref 6.0–8.3)

## 2014-09-13 LAB — CBC
HCT: 38.7 % (ref 36.0–46.0)
Hemoglobin: 13 g/dL (ref 12.0–15.0)
MCH: 29.7 pg (ref 26.0–34.0)
MCHC: 33.6 g/dL (ref 30.0–36.0)
MCV: 88.4 fL (ref 78.0–100.0)
PLATELETS: 216 10*3/uL (ref 150–400)
RBC: 4.38 MIL/uL (ref 3.87–5.11)
RDW: 13.8 % (ref 11.5–15.5)
WBC: 6.1 10*3/uL (ref 4.0–10.5)

## 2014-09-13 LAB — OB RESULTS CONSOLE GBS: GBS: POSITIVE

## 2014-09-13 LAB — POCT URINALYSIS DIP (DEVICE)
Bilirubin Urine: NEGATIVE
GLUCOSE, UA: NEGATIVE mg/dL
KETONES UR: NEGATIVE mg/dL
NITRITE: NEGATIVE
PH: 7 (ref 5.0–8.0)
Protein, ur: 30 mg/dL — AB
Specific Gravity, Urine: 1.015 (ref 1.005–1.030)
Urobilinogen, UA: 0.2 mg/dL (ref 0.0–1.0)

## 2014-09-13 LAB — TYPE AND SCREEN
ABO/RH(D): O POS
ANTIBODY SCREEN: NEGATIVE

## 2014-09-13 SURGERY — Surgical Case
Anesthesia: Choice

## 2014-09-13 SURGERY — Surgical Case
Anesthesia: Spinal | Site: Abdomen

## 2014-09-13 MED ORDER — MAGNESIUM SULFATE BOLUS VIA INFUSION
4.0000 g | Freq: Once | INTRAVENOUS | Status: AC
  Administered 2014-09-13: 4 g via INTRAVENOUS
  Filled 2014-09-13: qty 500

## 2014-09-13 MED ORDER — PHENYLEPHRINE HCL 10 MG/ML IJ SOLN
INTRAMUSCULAR | Status: DC | PRN
  Administered 2014-09-13 (×4): 80 ug via INTRAVENOUS

## 2014-09-13 MED ORDER — OXYTOCIN 10 UNIT/ML IJ SOLN
INTRAMUSCULAR | Status: AC
  Filled 2014-09-13: qty 4

## 2014-09-13 MED ORDER — CITRIC ACID-SODIUM CITRATE 334-500 MG/5ML PO SOLN
30.0000 mL | ORAL | Status: DC | PRN
  Administered 2014-09-13: 30 mL via ORAL
  Filled 2014-09-13: qty 15

## 2014-09-13 MED ORDER — DEXTROSE 5 % IV SOLN
3.0000 g | INTRAVENOUS | Status: AC
  Administered 2014-09-13: 3 g via INTRAVENOUS
  Filled 2014-09-13: qty 3000

## 2014-09-13 MED ORDER — ACETAMINOPHEN 325 MG PO TABS: 650.0000 mg | ORAL_TABLET | ORAL | Status: DC | PRN

## 2014-09-13 MED ORDER — PHENYLEPHRINE 8 MG IN D5W 100 ML (0.08MG/ML) PREMIX OPTIME
INJECTION | INTRAVENOUS | Status: AC
  Filled 2014-09-13: qty 100

## 2014-09-13 MED ORDER — ONDANSETRON HCL 4 MG/2ML IJ SOLN
INTRAMUSCULAR | Status: AC
  Filled 2014-09-13: qty 2

## 2014-09-13 MED ORDER — OXYCODONE-ACETAMINOPHEN 5-325 MG PO TABS: 2.0000 | ORAL_TABLET | ORAL | Status: DC | PRN

## 2014-09-13 MED ORDER — LACTATED RINGERS IV SOLN
500.0000 mL | INTRAVENOUS | Status: DC | PRN
Start: 2014-09-13 — End: 2014-09-13

## 2014-09-13 MED ORDER — BUPIVACAINE HCL (PF) 0.25 % IJ SOLN
INTRAMUSCULAR | Status: DC | PRN
  Administered 2014-09-13: 30 mL

## 2014-09-13 MED ORDER — KETOROLAC TROMETHAMINE 30 MG/ML IJ SOLN
30.0000 mg | Freq: Four times a day (QID) | INTRAMUSCULAR | Status: DC | PRN
  Administered 2014-09-13: 30 mg via INTRAVENOUS

## 2014-09-13 MED ORDER — MORPHINE SULFATE 0.5 MG/ML IJ SOLN
INTRAMUSCULAR | Status: AC
  Filled 2014-09-13: qty 10

## 2014-09-13 MED ORDER — SCOPOLAMINE 1 MG/3DAYS TD PT72
MEDICATED_PATCH | TRANSDERMAL | Status: AC
  Filled 2014-09-13: qty 1

## 2014-09-13 MED ORDER — ONDANSETRON HCL 4 MG/2ML IJ SOLN
INTRAMUSCULAR | Status: DC | PRN
  Administered 2014-09-13: 4 mg via INTRAVENOUS

## 2014-09-13 MED ORDER — OXYCODONE-ACETAMINOPHEN 5-325 MG PO TABS: 1.0000 | ORAL_TABLET | ORAL | Status: DC | PRN

## 2014-09-13 MED ORDER — LACTATED RINGERS IV SOLN
INTRAVENOUS | Status: DC
Start: 2014-09-13 — End: 2014-09-13
  Administered 2014-09-13: 12:00:00 via INTRAVENOUS

## 2014-09-13 MED ORDER — OXYTOCIN 40 UNITS IN LACTATED RINGERS INFUSION - SIMPLE MED: 62.5000 mL/h | INTRAVENOUS | Status: DC

## 2014-09-13 MED ORDER — FENTANYL CITRATE 0.05 MG/ML IJ SOLN
INTRAMUSCULAR | Status: AC
  Filled 2014-09-13: qty 2

## 2014-09-13 MED ORDER — PHENYLEPHRINE 8 MG IN D5W 100 ML (0.08MG/ML) PREMIX OPTIME
INJECTION | INTRAVENOUS | Status: DC | PRN
  Administered 2014-09-13: 60 ug/min via INTRAVENOUS

## 2014-09-13 MED ORDER — BUPIVACAINE HCL (PF) 0.25 % IJ SOLN
INTRAMUSCULAR | Status: AC
Start: 2014-09-13 — End: 2014-09-13
  Filled 2014-09-13: qty 30

## 2014-09-13 MED ORDER — KETOROLAC TROMETHAMINE 30 MG/ML IJ SOLN
INTRAMUSCULAR | Status: AC
  Filled 2014-09-13: qty 1

## 2014-09-13 MED ORDER — FENTANYL CITRATE 0.05 MG/ML IJ SOLN
INTRAMUSCULAR | Status: DC | PRN
  Administered 2014-09-13: 25 ug via INTRATHECAL

## 2014-09-13 MED ORDER — ONDANSETRON HCL 4 MG/2ML IJ SOLN: 4.0000 mg | Freq: Four times a day (QID) | INTRAMUSCULAR | Status: DC | PRN

## 2014-09-13 MED ORDER — DEXAMETHASONE SODIUM PHOSPHATE 10 MG/ML IJ SOLN
INTRAMUSCULAR | Status: AC
  Filled 2014-09-13: qty 1

## 2014-09-13 MED ORDER — MORPHINE SULFATE (PF) 0.5 MG/ML IJ SOLN
INTRAMUSCULAR | Status: DC | PRN
  Administered 2014-09-13: .2 mg via INTRATHECAL

## 2014-09-13 MED ORDER — SCOPOLAMINE 1 MG/3DAYS TD PT72
MEDICATED_PATCH | TRANSDERMAL | Status: DC | PRN
  Administered 2014-09-13: 1 via TRANSDERMAL

## 2014-09-13 MED ORDER — DIPHENHYDRAMINE HCL 50 MG/ML IJ SOLN
12.5000 mg | INTRAMUSCULAR | Status: DC | PRN
  Administered 2014-09-13 – 2014-09-14 (×2): 12.5 mg via INTRAVENOUS
  Filled 2014-09-13: qty 1

## 2014-09-13 MED ORDER — EPHEDRINE SULFATE 50 MG/ML IJ SOLN: INTRAMUSCULAR | Status: DC | PRN

## 2014-09-13 MED ORDER — FENTANYL CITRATE 0.05 MG/ML IJ SOLN: 25.0000 ug | INTRAMUSCULAR | Status: DC | PRN

## 2014-09-13 MED ORDER — OXYTOCIN 10 UNIT/ML IJ SOLN
40.0000 [IU] | INTRAVENOUS | Status: DC | PRN
  Administered 2014-09-13: 40 [IU] via INTRAVENOUS

## 2014-09-13 MED ORDER — FAMOTIDINE IN NACL 20-0.9 MG/50ML-% IV SOLN
20.0000 mg | Freq: Once | INTRAVENOUS | Status: AC
  Administered 2014-09-13: 20 mg via INTRAVENOUS
  Filled 2014-09-13 (×2): qty 50

## 2014-09-13 MED ORDER — PHENYLEPHRINE 40 MCG/ML (10ML) SYRINGE FOR IV PUSH (FOR BLOOD PRESSURE SUPPORT)
PREFILLED_SYRINGE | INTRAVENOUS | Status: AC
  Filled 2014-09-13: qty 20

## 2014-09-13 MED ORDER — OXYTOCIN BOLUS FROM INFUSION: 500.0000 mL | INTRAVENOUS | Status: DC

## 2014-09-13 MED ORDER — LIDOCAINE HCL (PF) 1 % IJ SOLN: 30.0000 mL | INTRAMUSCULAR | Status: DC | PRN

## 2014-09-13 MED ORDER — KETOROLAC TROMETHAMINE 30 MG/ML IJ SOLN: 30.0000 mg | Freq: Four times a day (QID) | INTRAMUSCULAR | Status: DC | PRN

## 2014-09-13 MED ORDER — MEPERIDINE HCL 25 MG/ML IJ SOLN: 6.2500 mg | INTRAMUSCULAR | Status: DC | PRN

## 2014-09-13 MED ORDER — DIPHENHYDRAMINE HCL 50 MG/ML IJ SOLN
INTRAMUSCULAR | Status: AC
  Filled 2014-09-13: qty 1

## 2014-09-13 MED ORDER — DIPHENHYDRAMINE HCL 25 MG PO CAPS
25.0000 mg | ORAL_CAPSULE | ORAL | Status: DC | PRN
Start: 2014-09-13 — End: 2014-09-16

## 2014-09-13 MED ORDER — LABETALOL HCL 5 MG/ML IV SOLN: 10.0000 mg | INTRAVENOUS | Status: DC | PRN

## 2014-09-13 MED ORDER — MAGNESIUM SULFATE 40 G IN LACTATED RINGERS - SIMPLE
2.0000 g/h | INTRAVENOUS | Status: DC
  Filled 2014-09-13: qty 500

## 2014-09-13 MED ORDER — BUPIVACAINE IN DEXTROSE 0.75-8.25 % IT SOLN
INTRATHECAL | Status: DC | PRN
  Administered 2014-09-13: 13.5 mg via INTRATHECAL

## 2014-09-13 MED ORDER — DEXAMETHASONE SODIUM PHOSPHATE 10 MG/ML IJ SOLN
INTRAMUSCULAR | Status: DC | PRN
  Administered 2014-09-13: 5 mg via INTRAVENOUS

## 2014-09-13 SURGICAL SUPPLY — 41 items
APL SKNCLS STERI-STRIP NONHPOA (GAUZE/BANDAGES/DRESSINGS) ×1
BENZOIN TINCTURE PRP APPL 2/3 (GAUZE/BANDAGES/DRESSINGS) ×3 IMPLANT
CATH ROBINSON RED A/P 16FR (CATHETERS) IMPLANT
CLAMP CORD UMBIL (MISCELLANEOUS) ×6 IMPLANT
CLOSURE WOUND 1/2 X4 (GAUZE/BANDAGES/DRESSINGS) ×1
CLOTH BEACON ORANGE TIMEOUT ST (SAFETY) ×3 IMPLANT
DRAPE SHEET LG 3/4 BI-LAMINATE (DRAPES) IMPLANT
DRSG OPSITE POSTOP 4X10 (GAUZE/BANDAGES/DRESSINGS) ×3 IMPLANT
DURAPREP 26ML APPLICATOR (WOUND CARE) ×3 IMPLANT
ELECT REM PT RETURN 9FT ADLT (ELECTROSURGICAL) ×3
ELECTRODE REM PT RTRN 9FT ADLT (ELECTROSURGICAL) ×1 IMPLANT
EXTRACTOR VACUUM M CUP 4 TUBE (SUCTIONS) IMPLANT
EXTRACTOR VACUUM M CUP 4' TUBE (SUCTIONS)
GLOVE BIOGEL PI IND STRL 7.5 (GLOVE) ×2 IMPLANT
GLOVE BIOGEL PI INDICATOR 7.5 (GLOVE) ×4
GLOVE ECLIPSE 7.5 STRL STRAW (GLOVE) ×5 IMPLANT
GLOVE INDICATOR 7.0 STRL GRN (GLOVE) ×6 IMPLANT
GOWN STRL REUS W/TWL LRG LVL3 (GOWN DISPOSABLE) ×9 IMPLANT
KIT ABG SYR 3ML LUER SLIP (SYRINGE) IMPLANT
NDL HYPO 18GX1.5 BLUNT FILL (NEEDLE) IMPLANT
NDL HYPO 25X5/8 SAFETYGLIDE (NEEDLE) IMPLANT
NEEDLE HYPO 18GX1.5 BLUNT FILL (NEEDLE) ×6 IMPLANT
NEEDLE HYPO 22GX1.5 SAFETY (NEEDLE) ×3 IMPLANT
NEEDLE HYPO 25X5/8 SAFETYGLIDE (NEEDLE) IMPLANT
NS IRRIG 1000ML POUR BTL (IV SOLUTION) ×3 IMPLANT
PACK C SECTION WH (CUSTOM PROCEDURE TRAY) ×3 IMPLANT
PAD OB MATERNITY 4.3X12.25 (PERSONAL CARE ITEMS) ×3 IMPLANT
RTRCTR C-SECT PINK 25CM LRG (MISCELLANEOUS) ×2 IMPLANT
STRIP CLOSURE SKIN 1/2X4 (GAUZE/BANDAGES/DRESSINGS) ×2 IMPLANT
SUT MNCRL 0 VIOLET CTX 36 (SUTURE) IMPLANT
SUT MON AB 3-0 SH 27 (SUTURE) ×3
SUT MON AB 3-0 SH27 (SUTURE) IMPLANT
SUT MONOCRYL 0 CTX 36 (SUTURE)
SUT VIC AB 0 CTX 36 (SUTURE) ×12
SUT VIC AB 0 CTX36XBRD ANBCTRL (SUTURE) ×3 IMPLANT
SUT VIC AB 2-0 CT1 27 (SUTURE) ×3
SUT VIC AB 2-0 CT1 TAPERPNT 27 (SUTURE) ×1 IMPLANT
SUT VIC AB 4-0 KS 27 (SUTURE) ×3 IMPLANT
SYR 30ML LL (SYRINGE) ×3 IMPLANT
TOWEL OR 17X24 6PK STRL BLUE (TOWEL DISPOSABLE) ×3 IMPLANT
TRAY FOLEY CATH 14FR (SET/KITS/TRAYS/PACK) ×3 IMPLANT

## 2014-09-13 NOTE — Progress Notes (Signed)
Bedside US done - presentation still breech/vtx.  Will proceed with c-section  Candelaria CelesteSTINSON, Miloh Alcocer JEHIEL 09/13/2014 6:14 PM

## 2014-09-13 NOTE — Progress Notes (Signed)
Per Dr. Adrian BlackwaterStinson, pt was contacted on 1/15 by [place] as reminder of her scheduled C/S today.  Pt refused C/S and is now here for NST.  Dr. Adrian BlackwaterStinson called and requested that pt be seen by provider during NST

## 2014-09-13 NOTE — Addendum Note (Signed)
Addended by: Jill SideAY, Ami Mally L on: 09/13/2014 11:50 AM   Modules accepted: Orders

## 2014-09-13 NOTE — Consult Note (Signed)
Neonatology Note:   Attendance at C-section:    I was asked by Dr. Adrian BlackwaterStinson to attend this primary C/S at 37 1/7 weeks due to twin gestation and breech presentation of one twin. The mother is a G3P2 O pos, GBS pos with gestational HTN. She received magnesium sulfate for about 7 hours prior to delivery for elevated BP.  ROM at delivery, fluid clear.  Twin A, a female, was delivered frank breech. He was vigorous with good spontaneous cry and tone. Needed only minimal bulb suctioning. Ap 9/9. Lungs clear to ausc in DR. To CN to care of Pediatrician.  Twin B, a female, was delivered vertex. She was vigorous with good spontaneous cry and tone. Needed only minimal bulb suctioning. Ap 8/9. Lungs clear to ausc in DR. To CN to care of Pediatrician.    Colleen Souhristie C. Rajvi Armentor, MD

## 2014-09-13 NOTE — Anesthesia Procedure Notes (Signed)
Spinal Patient location during procedure: OR Staffing Anesthesiologist: Benton Tooker, CHRIS Preanesthetic Checklist Completed: patient identified, surgical consent, pre-op evaluation, timeout performed, IV checked, risks and benefits discussed and monitors and equipment checked Spinal Block Patient position: sitting Prep: site prepped and draped and DuraPrep Patient monitoring: heart rate, cardiac monitor, continuous pulse ox and blood pressure Approach: midline Location: L4-5 Injection technique: single-shot Needle Needle type: Pencan  Needle gauge: 24 G Needle length: 10 cm Assessment Sensory level: T3

## 2014-09-13 NOTE — Progress Notes (Signed)
Patient is doing well. She desires to have delivery plan delayed until 38 weeks in the hopes of having a presenting vertex twin. Ultrasound today still demonstrates fetuses in breech/vertex presentation. In addition, patient with severe range blood pressure, asymptomatic. Discussed need for delivery due to hypertension alone. After much discussion and reassurance, patient and her husband agree to have delivery today. In view of severe range BP, will admit to L&D for magnesium sulfate with plans for delivery later today pending anesthesia (patient had breakfast this morning and a bottle of water at 10 am) L&D and Dr. Adrian BlackwaterStinson notified

## 2014-09-13 NOTE — H&P (Signed)
Colleen Terrell is a 34 y.o. female [id number] @ 37.1wks with di-di twin preg presenting for mag sulfate therapy due to gHTN. Twins are breech/vtx presentation and therefore pLTCS will be necessary for delivery. [facility] and has been remarkable for 1) di-di twins 2) AMA 3) gHTN w/severe range BPs  History OB History    Gravida Para Term Preterm AB TAB SAB Ectopic Multiple Living   3 2 2       2      Past Medical History  Diagnosis Date  . Unspecified hemorrhoids without mention of complication   . History of kidney infection during pregnancy   . PP NVB 02/02/12 02/02/2012  . GERD (gastroesophageal reflux disease)    History reviewed. No pertinent past surgical history. Family History: family history includes Hypertension in her father and mother. There is no history of Anesthesia problems. Social History:  reports that she has never smoked. She has never used smokeless tobacco. She reports that she does not drink alcohol or use illicit drugs.   Prenatal Transfer Tool  Maternal Diabetes: No Genetic Screening: Declined Maternal Ultrasounds/Referrals: Normal Fetal Ultrasounds or other Referrals:  None Maternal Substance Abuse:  No Significant Maternal Medications:  None Significant Maternal Lab Results:  Lab values include: Group B Strep positive Other Comments:  None  ROS    Last menstrual period 12/27/2013. Exam Physical Exam  Constitutional: She is oriented to person, place, and time. She appears well-developed.  HENT:  Head: Normocephalic.  Neck: Normal range of motion.  Cardiovascular:  Tachy, regular 110  Respiratory: Effort normal.  GI:  EFM A: 140s, +10x10accels, no decels B: 140s, still obtaining NST  No ctx  Musculoskeletal: Normal range of motion.  Trace edema BLE  Neurological: She is alert and oriented to person, place, and time.  Skin: Skin is warm and dry.  Psychiatric: She has a normal mood and affect. Her  behavior is normal. Thought content normal.    Prenatal labs: ABO, Rh: O/POS/-- (07/23 1013) Antibody: NEG (07/23 1013) Rubella: 1.53 (07/23 1013) RPR: NON REAC (11/12 1211)  HBsAg: NEGATIVE (07/23 1013)  HIV: NONREACTIVE (11/12 1211)  GBS: Positive (01/18 0000)   Assessment/Plan: Twin IUP @ 37.1wks gHTN w/ severe range BPs Breech/vtx presentation  [facility] Initiation of mag sulfate therapy Pt to remain NPO until sched pLTCS @ 1830   SHAW, KIMBERLY CNM 09/13/2014, 12:34 PM

## 2014-09-13 NOTE — Progress Notes (Addendum)
Patient ID: Colleen Terrell, female   DOB: 06/23/1981, 34 y.o.   MRN: [id number]  The risks of cesarean section discussed with the patient included but were not limited to: bleeding which may require transfusion or reoperation; infection which may require antibiotics; injury to bowel, bladder, ureters or other surrounding organs; injury to the fetus; need for additional procedures including hysterectomy in the event of a life-threatening hemorrhage; placental abnormalities wth subsequent pregnancies, incisional problems, thromboembolic phenomenon and other postoperative/anesthesia complications. The patient concurred with the proposed plan, giving informed written consent for the procedure.   Patient has been NPO since 10:30am she will remain NPO for procedure. Anesthesia and OR aware.  Preoperative prophylactic Ancef ordered on call to the OR.  To OR when ready.  Candelaria CelesteSTINSON, Ebenezer Mccaskey JEHIEL 09/13/2014 4:45 PM

## 2014-09-13 NOTE — Op Note (Signed)
Colleen Terrell PROCEDURE DATE: 09/13/2014  PREOPERATIVE DIAGNOSIS: Intrauterine pregnancy at  574w1d weeks gestation; severe preeclampsia and Twin gestations, with presenting baby breech  POSTOPERATIVE DIAGNOSIS: The same  PROCEDURE: Primary Low Transverse Cesarean Section  SURGEON:  Dr. Candelaria CelesteJacob Stinson  INDICATIONS: Colleen Terrell is a 34 y.o. [id number] at 7674w1d scheduled for cesarean section secondary to severe preeclampsia and twin gestation with presenting baby breech.  The risks of cesarean section discussed with the patient included but were not limited to: bleeding which may require transfusion or reoperation; infection which may require antibiotics; injury to bowel, bladder, ureters or other surrounding organs; injury to the fetus; need for additional procedures including hysterectomy in the event of a life-threatening hemorrhage; placental abnormalities wth subsequent pregnancies, incisional problems, thromboembolic phenomenon and other postoperative/anesthesia complications. The patient concurred with the proposed plan, giving informed written consent for the procedure.    FINDINGS:  Viable female infant ([name]) in breech presentation.  Apgars 9 and 9, weight pending.  Viable female infant (Baby B) in vertex presentation.  Apgars 8 and 9.  Clear amniotic fluid.  Intact placenta, three vessel cord.  Normal uterus, fallopian tubes and ovaries bilaterally.  ANESTHESIA:    Spinal INTRAVENOUS FLUIDS:1000 ml ESTIMATED BLOOD LOSS: 1000 ml URINE OUTPUT:  100 ml SPECIMENS: Placenta sent to pathology COMPLICATIONS: None immediate  PROCEDURE IN DETAIL:  The patient received intravenous antibiotics and had sequential compression devices applied to her lower extremities while in the preoperative area.  She was then taken to the operating room where spinal anesthesia was administered and was found to be adequate. She was then placed in a dorsal supine position with a leftward tilt, and  prepped and draped in a sterile manner.  A foley catheter was placed into her bladder and attached to constant gravity, which drained clear fluid throughout.  After an adequate timeout was performed, a Pfannenstiel skin incision was made with scalpel and carried through to the underlying layer of fascia. The fascia was incised in the midline and this incision was extended bilaterally using the Mayo scissors. Kocher clamps were applied to the superior aspect of the fascial incision and the underlying rectus muscles were dissected off bluntly. A similar process was carried out on the inferior aspect of the facial incision. The rectus muscles were separated in the midline bluntly and the peritoneum was entered bluntly. An Alexis retractor was placed to aid in visualization of the uterus.  Attention was turned to the lower uterine segment where a transverse hysterotomy was made with a scalpel and extended bilaterally bluntly. [name] was found to be in breech position.  The infant was successfully delivered, and cord was clamped and cut and infant was handed over to awaiting neonatology team.  The amniotic sac for Baby B was then ruptured with clear fluid.  Baby B was found in a vertex position.  The infant was successfully delivered, the cord was clamped and cut, and the infant was handed over to the awaiting neonatology team.  Uterine massage was then administered and the placenta delivered intact with three-vessel cord. The uterus was then cleared of clot and debris.  The hysterotomy was closed with 0 Vicryl in a running locked fashion, and an imbricating layer was also placed with a 0 Vicryl. Overall, excellent hemostasis was noted. The abdomen and the pelvis were cleared of all clot and debris and the Jon Gillslexis was removed. Hemostasis was confirmed on all surfaces.  The peritoneum was reapproximated using 2-0 vicryl running  stitches. The fascia was then closed using 0 Vicryl in a running fashion.  The skin was closed  with 4-0 vicryl. The patient tolerated the procedure well. Sponge, lap, instrument and needle counts were correct x 2. She was taken to the recovery room in stable condition.    Candelaria Celeste JEHIEL DO 09/13/2014 7:54 PM

## 2014-09-13 NOTE — Anesthesia Preprocedure Evaluation (Signed)
Anesthesia Evaluation  Patient identified by MRN, date of birth, ID band Patient awake    Reviewed: Allergy & Precautions, NPO status , Patient's Chart, lab work & pertinent test results  History of Anesthesia Complications Negative for: history of anesthetic complications  Airway Mallampati: II  TM Distance: >3 FB Neck ROM: Full    Dental no notable dental hx. (+) Dental Advisory Given   Pulmonary neg pulmonary ROS,  breath sounds clear to auscultation  Pulmonary exam normal       Cardiovascular hypertension, Rhythm:Regular Rate:Normal     Neuro/Psych negative neurological ROS  negative psych ROS   GI/Hepatic Neg liver ROS, GERD-  Medicated and Controlled,  Endo/Other  Morbid obesity  Renal/GU negative Renal ROS  negative genitourinary   Musculoskeletal negative musculoskeletal ROS (+)   Abdominal   Peds negative pediatric ROS (+)  Hematology negative hematology ROS (+)   Anesthesia Other Findings   Reproductive/Obstetrics negative OB ROS                             Anesthesia Physical Anesthesia Plan  ASA: III  Anesthesia Plan: Spinal   Post-op Pain Management:    Induction:   Airway Management Planned:   Additional Equipment:   Intra-op Plan:   Post-operative Plan:   Informed Consent: I have reviewed the patients History and Physical, chart, labs and discussed the procedure including the risks, benefits and alternatives for the proposed anesthesia with the patient or authorized representative who has indicated his/her understanding and acceptance.   Dental advisory given  Plan Discussed with: CRNA  Anesthesia Plan Comments:         Anesthesia Quick Evaluation

## 2014-09-13 NOTE — Transfer of Care (Signed)
Immediate Anesthesia Transfer of Care Note  Patient: Colleen Terrell  Procedure(s) Performed: Procedure(s): CESAREAN SECTION (N/A)  Patient Location: PACU  Anesthesia Type:Spinal  Level of Consciousness: awake, alert  and oriented  Airway & Oxygen Therapy: Patient Spontanous Breathing  Post-op Assessment: Report given to PACU RN and Post -op Vital signs reviewed and stable  Post vital signs: Reviewed and stable  Complications: No apparent anesthesia complications

## 2014-09-14 ENCOUNTER — Encounter (HOSPITAL_COMMUNITY): Payer: Self-pay | Admitting: *Deleted

## 2014-09-14 LAB — RPR: RPR: NONREACTIVE

## 2014-09-14 LAB — CBC
HCT: 35.7 % — ABNORMAL LOW (ref 36.0–46.0)
Hemoglobin: 12.1 g/dL (ref 12.0–15.0)
MCH: 29.9 pg (ref 26.0–34.0)
MCHC: 33.9 g/dL (ref 30.0–36.0)
MCV: 88.1 fL (ref 78.0–100.0)
PLATELETS: 211 10*3/uL (ref 150–400)
RBC: 4.05 MIL/uL (ref 3.87–5.11)
RDW: 13.4 % (ref 11.5–15.5)
WBC: 10.9 10*3/uL — AB (ref 4.0–10.5)

## 2014-09-14 LAB — HIV ANTIBODY (ROUTINE TESTING W REFLEX)
HIV 1/O/2 Abs-Index Value: <1 (ref <1.00)
HIV-1/HIV-2 Ab: NONREACTIVE

## 2014-09-14 MED ORDER — LANOLIN HYDROUS EX OINT: 1.0000 "application " | TOPICAL_OINTMENT | CUTANEOUS | Status: DC | PRN

## 2014-09-14 MED ORDER — MAGNESIUM SULFATE 40 G IN LACTATED RINGERS - SIMPLE
2.0000 g/h | INTRAVENOUS | Status: AC
  Administered 2014-09-14: 2 g/h via INTRAVENOUS
  Filled 2014-09-14 (×2): qty 500

## 2014-09-14 MED ORDER — NALBUPHINE HCL 10 MG/ML IJ SOLN: 5.0000 mg | INTRAMUSCULAR | Status: DC | PRN

## 2014-09-14 MED ORDER — WITCH HAZEL-GLYCERIN EX PADS: 1.0000 "application " | MEDICATED_PAD | CUTANEOUS | Status: DC | PRN

## 2014-09-14 MED ORDER — SODIUM CHLORIDE 0.9 % IJ SOLN: 3.0000 mL | INTRAMUSCULAR | Status: DC | PRN

## 2014-09-14 MED ORDER — NALOXONE HCL 0.4 MG/ML IJ SOLN: 0.4000 mg | INTRAMUSCULAR | Status: DC | PRN

## 2014-09-14 MED ORDER — NALBUPHINE HCL 10 MG/ML IJ SOLN: 5.0000 mg | Freq: Once | INTRAMUSCULAR | Status: AC | PRN

## 2014-09-14 MED ORDER — ZOLPIDEM TARTRATE 5 MG PO TABS: 5.0000 mg | ORAL_TABLET | Freq: Every evening | ORAL | Status: DC | PRN

## 2014-09-14 MED ORDER — SIMETHICONE 80 MG PO CHEW: 80.0000 mg | CHEWABLE_TABLET | ORAL | Status: DC | PRN

## 2014-09-14 MED ORDER — PRENATAL MULTIVITAMIN CH
1.0000 | ORAL_TABLET | Freq: Every day | ORAL | Status: DC
  Administered 2014-09-14 – 2014-09-16 (×3): 1 via ORAL
  Filled 2014-09-14 (×3): qty 1

## 2014-09-14 MED ORDER — SCOPOLAMINE 1 MG/3DAYS TD PT72: 1.0000 | MEDICATED_PATCH | Freq: Once | TRANSDERMAL | Status: DC

## 2014-09-14 MED ORDER — DIPHENHYDRAMINE HCL 25 MG PO CAPS: 25.0000 mg | ORAL_CAPSULE | Freq: Four times a day (QID) | ORAL | Status: DC | PRN

## 2014-09-14 MED ORDER — OXYCODONE-ACETAMINOPHEN 5-325 MG PO TABS
1.0000 | ORAL_TABLET | ORAL | Status: DC | PRN
  Administered 2014-09-14 – 2014-09-16 (×4): 1 via ORAL
  Filled 2014-09-14 (×4): qty 1

## 2014-09-14 MED ORDER — HYDROCHLOROTHIAZIDE 25 MG PO TABS
50.0000 mg | ORAL_TABLET | Freq: Every day | ORAL | Status: DC
  Administered 2014-09-14 – 2014-09-15 (×2): 50 mg via ORAL
  Filled 2014-09-14 (×4): qty 2

## 2014-09-14 MED ORDER — IBUPROFEN 600 MG PO TABS
600.0000 mg | ORAL_TABLET | Freq: Four times a day (QID) | ORAL | Status: DC
  Administered 2014-09-14 – 2014-09-16 (×10): 600 mg via ORAL
  Filled 2014-09-14 (×10): qty 1

## 2014-09-14 MED ORDER — TETANUS-DIPHTH-ACELL PERTUSSIS 5-2.5-18.5 LF-MCG/0.5 IM SUSP
0.5000 mL | Freq: Once | INTRAMUSCULAR | Status: DC
  Filled 2014-09-14: qty 0.5

## 2014-09-14 MED ORDER — OXYTOCIN 40 UNITS IN LACTATED RINGERS INFUSION - SIMPLE MED: 62.5000 mL/h | INTRAVENOUS | Status: AC

## 2014-09-14 MED ORDER — OXYCODONE-ACETAMINOPHEN 5-325 MG PO TABS
2.0000 | ORAL_TABLET | ORAL | Status: DC | PRN
  Administered 2014-09-15 (×2): 2 via ORAL
  Filled 2014-09-14 (×2): qty 2

## 2014-09-14 MED ORDER — LACTATED RINGERS IV SOLN
INTRAVENOUS | Status: DC
  Administered 2014-09-14 (×2): via INTRAVENOUS

## 2014-09-14 MED ORDER — FAMOTIDINE IN NACL 20-0.9 MG/50ML-% IV SOLN
20.0000 mg | Freq: Once | INTRAVENOUS | Status: DC
  Filled 2014-09-14: qty 50

## 2014-09-14 MED ORDER — ONDANSETRON HCL 4 MG/2ML IJ SOLN: 4.0000 mg | Freq: Three times a day (TID) | INTRAMUSCULAR | Status: DC | PRN

## 2014-09-14 MED ORDER — MENTHOL 3 MG MT LOZG: 1.0000 | LOZENGE | OROMUCOSAL | Status: DC | PRN

## 2014-09-14 MED ORDER — ONDANSETRON HCL 4 MG PO TABS: 4.0000 mg | ORAL_TABLET | ORAL | Status: DC | PRN

## 2014-09-14 MED ORDER — DIBUCAINE 1 % RE OINT: 1.0000 "application " | TOPICAL_OINTMENT | RECTAL | Status: DC | PRN

## 2014-09-14 MED ORDER — NALOXONE HCL 1 MG/ML IJ SOLN
1.0000 ug/kg/h | INTRAVENOUS | Status: DC | PRN
  Filled 2014-09-14: qty 2

## 2014-09-14 MED ORDER — SENNOSIDES-DOCUSATE SODIUM 8.6-50 MG PO TABS
2.0000 | ORAL_TABLET | ORAL | Status: DC
  Administered 2014-09-14 (×2): 2 via ORAL
  Filled 2014-09-14 (×3): qty 2

## 2014-09-14 MED ORDER — SIMETHICONE 80 MG PO CHEW
80.0000 mg | CHEWABLE_TABLET | ORAL | Status: DC
  Administered 2014-09-14 – 2014-09-15 (×3): 80 mg via ORAL
  Filled 2014-09-14 (×3): qty 1

## 2014-09-14 MED ORDER — SIMETHICONE 80 MG PO CHEW
80.0000 mg | CHEWABLE_TABLET | Freq: Three times a day (TID) | ORAL | Status: DC
  Administered 2014-09-14 – 2014-09-16 (×7): 80 mg via ORAL
  Filled 2014-09-14 (×7): qty 1

## 2014-09-14 MED ORDER — ONDANSETRON HCL 4 MG/2ML IJ SOLN: 4.0000 mg | INTRAMUSCULAR | Status: DC | PRN

## 2014-09-14 MED ORDER — FUROSEMIDE 10 MG/ML IJ SOLN
20.0000 mg | Freq: Once | INTRAMUSCULAR | Status: AC
  Administered 2014-09-14: 20 mg via INTRAVENOUS
  Filled 2014-09-14: qty 2

## 2014-09-14 NOTE — Addendum Note (Signed)
Addendum  created 09/14/14 0746 by Yolonda KidaAlison L Hezzie Karim, CRNA   Modules edited: Notes Section   Notes Section:  File: [id number]

## 2014-09-14 NOTE — Progress Notes (Signed)
Subjective: Postpartum Day 1: Cesarean Delivery Patient reports tolerating PO.  Foley catheter in.  Some drainage onto the honeycomb.  No dehiscence.  Objective: Vital signs in last 24 hours: Temp:  [97.3 F (36.3 C)-98.7 F (37.1 C)] 98.3 F (36.8 C) (01/19 0500) Pulse Rate:  [69-110] 80 (01/19 0600) Resp:  [18-36] 20 (01/19 0600) BP: (120-174)/(63-109) 151/86 mmHg (01/19 0600) SpO2:  [95 %-100 %] 100 % (01/19 0600) Weight:  [250 lb (113.399 kg)-275 lb (124.739 kg)] 254 lb 4.8 oz (115.35 kg) (01/19 0600)  Physical Exam:  General: alert and no distress Lochia: appropriate Uterine Fundus: firm Incision: healing well, drainage on honey comb, no fresh blood.  No dehiscence DVT Evaluation: No evidence of DVT seen on physical exam. Negative Homan's sign. No cords or calf tenderness. Mild edema   Recent Labs  09/13/14 1225 09/14/14 0534  HGB 13.0 12.1  HCT 38.7 35.7*    Assessment/Plan: Status post Cesarean section. Doing well postoperatively.  Continue current care. Lasix 20mg  IV Start hydrochlorothiazide for HTN.  STINSON, JACOB JEHIEL 09/14/2014, 6:57 AM

## 2014-09-14 NOTE — Anesthesia Postprocedure Evaluation (Signed)
  Anesthesia Post-op Note  Patient: Colleen Terrell  Procedure(s) Performed: Procedure(s): CESAREAN SECTION (N/A)  Patient Location: A-ICU  Anesthesia Type:Spinal  Level of Consciousness: awake, alert , oriented and patient cooperative  Airway and Oxygen Therapy: Patient Spontanous Breathing  Post-op Pain: mild  Post-op Assessment: Post-op Vital signs reviewed, Patient's Cardiovascular Status Stable, Respiratory Function Stable, Patent Airway, No headache, No backache, No residual numbness and No residual motor weakness  Post-op Vital Signs: Reviewed and stable  Last Vitals:  Filed Vitals:   09/14/14 0700  BP: 140/80  Pulse: 75  Temp:   Resp: 20    Complications: No apparent anesthesia complications

## 2014-09-14 NOTE — Anesthesia Postprocedure Evaluation (Signed)
  Anesthesia Post-op Note  Patient: Colleen Terrell  Procedure(s) Performed: Procedure(s): CESAREAN SECTION (N/A)  Patient Location: PACU  Anesthesia Type:Spinal  Level of Consciousness: awake  Airway and Oxygen Therapy: Patient Spontanous Breathing  Post-op Pain: mild  Post-op Assessment: Post-op Vital signs reviewed, Patient's Cardiovascular Status Stable, Respiratory Function Stable, Patent Airway, No signs of Nausea or vomiting and Pain level controlled  Post-op Vital Signs: Reviewed and stable  Last Vitals:  Filed Vitals:   09/14/14 0100  BP: 145/87  Pulse:   Temp: 36.6 C  Resp: 20    Complications: No apparent anesthesia complications

## 2014-09-14 NOTE — Lactation Note (Signed)
This note was copied from the chart of Colleen Garnett Oumarou Terrell. Lactation Consultation Note     Initial consult with this mom of 37 1/7 week twins, now 18 hours old. Mom has been formula dn breast feeding both babies. [name], has been sleepy at the breast, and baby B has done better, as per mom. Both babies doing skin to skin at this time, and mom will have her nurse call when babies are cuing to feed. I reviewed breast feeding teaching from the Baby and Me book with mom, as well as lactation services review. On exam, mom has soft breasts with evert nipples, easily expressed colostrum. Mom is concerned her breasts are not full yet. I told her it was early, and she should feel her breast get full and warmer tomorrow at this time. I advised mom to try and breast feed her babies prior to offering a bottle of formula, and explained supply and demand to her. Mom will call when babies ready to feed, for assist with latch and for me to observe their latching.   Patient Name: Colleen Terrell Today's Date: 09/14/2014 Reason for consult: Initial assessment   Maternal Data Formula Feeding for Exclusion: Yes Reason for exclusion: Mother's choice to formula and breast feed on admission;Admission to Intensive Care Unit (ICU) post-partum Has patient been taught Hand Expression?: Yes Does the patient have breastfeeding experience prior to this delivery?: Yes  Feeding Feeding Type: Breast Fed Length of feed: 5 min  LATCH Score/Interventions Latch: Grasps breast easily, tongue down, lips flanged, rhythmical sucking. Intervention(s): Adjust position;Assist with latch  Audible Swallowing: Spontaneous and intermittent Intervention(s): Skin to skin  Type of Nipple: Everted at rest and after stimulation  Comfort (Breast/Nipple): Soft / non-tender     Hold (Positioning): No assistance needed to correctly position infant at breast. Intervention(s): Breastfeeding basics reviewed;Support  Pillows;Position options;Skin to skin  LATCH Score: 10  Lactation Tools Discussed/Used WIC Program: Yes   Consult Status Consult Status: Follow-up Date: 09/14/14 Follow-up type: In-patient    [name] 09/14/2014, 1:50 PM    

## 2014-09-14 NOTE — Lactation Note (Signed)
This note was copied from the chart of [name]. Lactation Consultation Note      Follow up consult with this mom and twin A, baby boy, now 8822 hours old. i assisted mom with latching baby after his bath, inc rsoss cradle hold. He latches easily with strong suckles. I assited mom with also feeding him 0.5 mls of EBm by nipple. Mom pleased, encouraged to breast feed prior to formula, and to limit amount of formula to 20 mls for next 24 hours, and then increase to as needed. Mom pleased to see that with hand expression she has milk. Mom knows to caol for questions/concerns.   Patient Name: Colleen Terrell ZOXWR'UToday's Date: 09/14/2014 Reason for consult: Follow-up assessment   Maternal Data    Feeding Feeding Type: Bottle Fed - Breast Milk Nipple Type: Slow - flow Length of feed: 13 min (baby still latached at this time)  LATCH Score/Interventions Latch: Grasps breast easily, tongue down, lips flanged, rhythmical sucking. Intervention(s): Adjust position;Assist with latch;Breast compression  Audible Swallowing: None Intervention(s): Skin to skin;Hand expression  Type of Nipple: Everted at rest and after stimulation  Comfort (Breast/Nipple): Soft / non-tender     Hold (Positioning): Assistance needed to correctly position infant at breast and maintain latch. Intervention(s): Breastfeeding basics reviewed;Support Pillows;Position options;Skin to skin (cross cradle hol shown with good latch and strong suckles)  LATCH Score: 7  Lactation Tools Discussed/Used WIC Program: Yes Pump Review: Setup, frequency, and cleaning;Milk Storage;Other (comment) (premie setting, handexpresion)   Consult Status Consult Status: Follow-up Date: 09/15/14 Follow-up type: In-patient    Alfred LevinsLee, Lianna Sitzmann Anne 09/14/2014, 6:15 PM

## 2014-09-14 NOTE — Lactation Note (Signed)
This note was copied from the chart of [name]. Lactation Consultation Note     Follow up consult with this mom of 37 week twins, now 3422 hours old. Mom wants to pump to protect her milk supply -she is formula feeding now, since the babies "are hungry after breast feeding" It is mom's choice to do both breast and formula. I started mom pumping with DEP in premie setting, I explained that mom should hand express after pumpiing, and reviewed milk storage guidelines. I advised [id number] ot pump every 3 hours for 15 minutes, Mom had a c-section, and is in AICU o n magnesium drip, so I told her to sleep tonight, and begin pumping again in the morning.   Patient Name: Colleen Terrell HYQMV'HToday's Date: 09/14/2014 Reason for consult: Follow-up assessment;Multiple gestation   Maternal Data Formula Feeding for Exclusion: Yes Reason for exclusion: Mother's choice to formula and breast feed on admission;Admission to Intensive Care Unit (ICU) post-partum Has patient been taught Hand Expression?: Yes Does the patient have breastfeeding experience prior to this delivery?: Yes  Feeding Feeding Type: Formula Nipple Type: Slow - flow Length of feed: 15 min  LATCH Score/Interventions                      Lactation Tools Discussed/Used WIC Program: Yes Pump Review: Setup, frequency, and cleaning;Milk Storage;Other (comment) (premie setting, handexpresion)   Consult Status Consult Status: Follow-up Date: 09/15/14 Follow-up type: In-patient    Alfred LevinsLee, Delshon Blanchfield Anne 09/14/2014, 5:39 PM

## 2014-09-15 MED ORDER — HYDROCHLOROTHIAZIDE 25 MG PO TABS
25.0000 mg | ORAL_TABLET | Freq: Every day | ORAL | Status: DC
  Administered 2014-09-16: 25 mg via ORAL
  Filled 2014-09-15 (×2): qty 1

## 2014-09-15 MED ORDER — AMLODIPINE BESYLATE 5 MG PO TABS
5.0000 mg | ORAL_TABLET | Freq: Every day | ORAL | Status: DC
  Administered 2014-09-15 – 2014-09-16 (×2): 5 mg via ORAL
  Filled 2014-09-15 (×3): qty 1

## 2014-09-15 NOTE — Progress Notes (Signed)
Post Partum Day #2; POD #2 Subjective: up ad lib, voiding, tolerating PO, + flatus and has not had a BM yet.  Has pain at site of incision with movement 8/10.  Controlled with ibuprofen. Denies headaches, dizziness, changes in vision, fever, chills, and leg pain. Both breastfeeding and bottle feeding. Would like OCPs for MOC.   Objective: Blood pressure 145/88, pulse 75, temperature 98 F (36.7 C), temperature source Oral, resp. rate 18, height 5\' 8"  (1.727 m), weight 115.35 kg (254 lb 4.8 oz), last menstrual period 12/27/2013, SpO2 100 %, unknown if currently breastfeeding.  Physical Exam:  General: alert, cooperative and no distress Lochia: appropriate Heart: heart sounds nml Lungs: breath sounds nml Uterine Fundus: firm Incision: healing well, no significant erythema. Pressure dressing had shadowing with new bleeding beyond the marked area. Honeycomb and steri strips saturdated with dry blood. Scant bleeding on application of pressure. DVT Evaluation: No evidence of DVT seen on physical exam.   Recent Labs  09/13/14 1225 09/14/14 0534  HGB 13.0 12.1  HCT 38.7 35.7*    Assessment/Plan: Removed pressure dressing, changed honeycomb and steri-strips. Instructed to keep in place for five days. Plan for discharge tomorrow, Breastfeeding and bottle feeding, and Contraception OCPs. Circumcision to be completed OP by pediatrician.    LOS: 2 days   Deborha PaymentMeyer, Ashley L PA-S 09/15/2014, 8:14 AM

## 2014-09-15 NOTE — Lactation Note (Signed)
This note was copied from the chart of Colleen Terrell. Lactation Consultation Note  Patient Name: Colleen Terrell ZOXWR'UToday's Date: 09/15/2014 Reason for consult: Follow-up assessment;Multiple gestation Mom reports for most feedings she is BF before giving any bottles. Parents are not recording I/O. LC stressed to parents the importance of recording I/O to determine well being and readiness for d/c. Feeding diaries given to parents for both babies and reviewed how to complete. Parents giving large amounts of formula with feedings. Supplemental guidelines given to and reviewed with parents. Advised parents babies should be going to the breast 8-12 times in 24 hours sustaining the latch for 15-20 minutes. Mom pumped at 0400 this am and reports receiving 20 ml of colostrum.   Baby B giving feeding ques, Mom latched Baby B with minimal assist with positioning to keep baby close to sustain good depth with latch. Baby sleepy when on the breast, demonstrated to parents how to stimulate baby to stay awake. Baby demonstrated a good rhythmic suck with few noted swallows.   [name] went to breast prior to Baby B. She demonstrated a good rhythmic suck with swallows noted as well. Mom needed LC to remind her to keep baby close. [name] latched to left breast, Mom's right breast leaking small amount with baby nursing. Mom c/o of cramping with babies at the breast.   Discussed feeding plan with parents: BF with each feeding before giving any supplement. BF with feeding ques but at least 8-12 times in 24 hours. Try to keep babies nursing for 15-20 minutes each feeding. Mom to post pump for 15 minutes to stimulate milk production and have EBM to supplement.  If Mom receives 20 ml or more with pumping divide the EBM between the babies to give each baby 10 ml of EBM as supplement per supplemental guidelines increasing as needed per hours of age.  If Mom receives less with pumping - give 1 baby EBM the  other formula, alternating each feeding which baby gets EBM v/s formula. FOB to give supplements.  If this plan is overwhelming or Mom is unable to pump due to cramping, BF each feeding keeping babies at breast for 15-20 minutes, supplement with 10 ml today each feeding increasing per supplemental guidelines using formula and post pump as often as she can.  Left phone number for Mom to call WIC about DEBP for d/c home.   Mom to call for questions/concerns or assist as needed.   Maternal Data    Feeding    LATCH Score/Interventions                      Lactation Tools Discussed/Used Tools: Pump Breast pump type: Double-Electric Breast Pump   Consult Status Consult Status: Follow-up Date: 09/15/14 Follow-up type: In-patient    Alfred LevinsGranger, Brylan Dec Ann 09/15/2014, 10:12 AM

## 2014-09-15 NOTE — Progress Notes (Signed)
Ur chart review completed.  

## 2014-09-16 ENCOUNTER — Other Ambulatory Visit: Payer: Medicaid Other

## 2014-09-16 MED ORDER — IBUPROFEN 600 MG PO TABS: 600.0000 mg | ORAL_TABLET | Freq: Four times a day (QID) | ORAL | Status: DC

## 2014-09-16 MED ORDER — OXYCODONE-ACETAMINOPHEN 5-325 MG PO TABS: 1.0000 | ORAL_TABLET | ORAL | Status: DC | PRN

## 2014-09-16 MED ORDER — HYDROCHLOROTHIAZIDE 25 MG PO TABS: 25.0000 mg | ORAL_TABLET | Freq: Every day | ORAL | Status: DC

## 2014-09-16 MED ORDER — AMLODIPINE BESYLATE 5 MG PO TABS: 5.0000 mg | ORAL_TABLET | Freq: Every day | ORAL | Status: DC

## 2014-09-16 MED ORDER — HYDROCORTISONE ACE-PRAMOXINE 1-1 % RE FOAM: 1.0000 | Freq: Two times a day (BID) | RECTAL | Status: DC

## 2014-09-16 NOTE — Discharge Instructions (Signed)

## 2014-09-16 NOTE — Lactation Note (Signed)
This note was copied from the chart of Colleen Terrell. Lactation Consultation Note  Patient Name: Colleen Terrell Today's Date: 09/16/2014 Reason for consult: Follow-up assessment Babies are 62 hours of life. Mom reports that her breasts are filling. Mom states that Baby girl "B" wakes and cues to nurse, latches on and nurses well for 15 to 30 minutes. Mom hears swallows while she is nursing, and her filling breast is softened after baby finishes nursing. Mom reports that Baby boy "A" sleeps longer than his sister and does not latch well.  Assisted mom to latch both babies at same time. Enc mom to latch baby boy first since he is more difficult to latch. Baby boy sleepy and not willing to latch well. Baby girl latches well, suckling rhythmically with intermittent swallows noted. Enc mom to nurse with cues, and at least 8-12 times/24 hours. Enc mom to nurse both at same time and to post pump after nursing. Enc mom to supplement both babies using supplementation guidelines until milk fully in and babies softening breasts. Enc mom to especially supplement baby boy since he is not latching well.   Referred mom to Baby and Me booklet for number of diapers to expect by day of life and mom aware of OP/BFSG and LC phone line assistance.   Parents given paperwork for WIC loaner and enc to call out when ready for DEBP. Mom states that she is active with WIC and though was enc to call WIC yesterday, she was too busy to do so. Enc mom to call WIC today.  Maternal Data    Feeding Feeding Type: Breast Fed Length of feed: 10 min  LATCH Score/Interventions Latch: Grasps breast easily, tongue down, lips flanged, rhythmical sucking.  Audible Swallowing: Spontaneous and intermittent  Type of Nipple: Everted at rest and after stimulation  Comfort (Breast/Nipple): Filling, red/small blisters or bruises, mild/mod discomfort  Problem noted: Filling Interventions (Filling): Double  electric pump  Hold (Positioning): Assistance needed to correctly position infant at breast and maintain latch. Intervention(s): Position options;Breastfeeding basics reviewed;Support Pillows  LATCH Score: 8  Lactation Tools Discussed/Used     Consult Status Consult Status: Complete    Sabian Kuba 09/16/2014, 9:49 AM    

## 2014-09-17 ENCOUNTER — Ambulatory Visit (HOSPITAL_COMMUNITY): Payer: Medicaid Other

## 2014-09-23 NOTE — Discharge Summary (Signed)
Obstetric Discharge Summary Reason for Admission: cesarean section breech/vertex presentaion of di-di twins and gHTN with severe range BPs. Prenatal Procedures: NST and Preeclampsia Intrapartum Procedures: cesarean: low cervical, transverse Postpartum Procedures: none Complications-Operative and Postpartum: none  Last Labs    HEMOGLOBIN  Date Value Ref Range Status  09/14/2014 12.1 12.0 - 15.0 g/dL Final   HCT  Date Value Ref Range Status  09/14/2014 35.7* 36.0 - 46.0 % Final     PROCEDURE DATE: 09/13/2014  PREOPERATIVE DIAGNOSIS: Intrauterine pregnancy at [redacted]w[redacted]d weeks gestation; severe preeclampsia and Twin gestations, with presenting [name] breech  POSTOPERATIVE DIAGNOSIS: The same  PROCEDURE: Primary Low Transverse Cesarean Section  SURGEON: Dr. Candelaria Celeste  INDICATIONS: Colleen Terrell is a 34 y.o. [id number] at [redacted]w[redacted]d scheduled for cesarean section secondary to severe preeclampsia and twin gestation with presenting baby breech. The risks of cesarean section discussed with the patient included but were not limited to: bleeding which may require transfusion or reoperation; infection which may require antibiotics; injury to bowel, bladder, ureters or other surrounding organs; injury to the fetus; need for additional procedures including hysterectomy in the event of a life-threatening hemorrhage; placental abnormalities wth subsequent pregnancies, incisional problems, thromboembolic phenomenon and other postoperative/anesthesia complications. The patient concurred with the proposed plan, giving informed written consent for the procedure.   FINDINGS: Viable female infant ([name]) in breech presentation. Apgars 9 and 9, weight pending. Viable female infant (Baby B) in vertex presentation. Apgars 8 and 9. Clear amniotic fluid. Intact placenta, three vessel cord. Normal uterus, fallopian tubes and ovaries bilaterally.  ANESTHESIA: Spinal INTRAVENOUS  FLUIDS:1000 ml ESTIMATED BLOOD LOSS: 1000 ml URINE OUTPUT: 100 ml SPECIMENS: Placenta sent to pathology COMPLICATIONS: None immediate  PROCEDURE IN DETAIL: The patient received intravenous antibiotics and had sequential compression devices applied to her lower extremities while in the preoperative area. She was then taken to the operating room where spinal anesthesia was administered and was found to be adequate. She was then placed in a dorsal supine position with a leftward tilt, and prepped and draped in a sterile manner. A foley catheter was placed into her bladder and attached to constant gravity, which drained clear fluid throughout. After an adequate timeout was performed, a Pfannenstiel skin incision was made with scalpel and carried through to the underlying layer of fascia. The fascia was incised in the midline and this incision was extended bilaterally using the Mayo scissors. Kocher clamps were applied to the superior aspect of the fascial incision and the underlying rectus muscles were dissected off bluntly. A similar process was carried out on the inferior aspect of the facial incision. The rectus muscles were separated in the midline bluntly and the peritoneum was entered bluntly. An Alexis retractor was placed to aid in visualization of the uterus. Attention was turned to the lower uterine segment where a transverse hysterotomy was made with a scalpel and extended bilaterally bluntly. [name] was found to be in breech position. The infant was successfully delivered, and cord was clamped and cut and infant was handed over to awaiting neonatology team. The amniotic sac for Baby B was then ruptured with clear fluid. Baby B was found in a vertex position. The infant was successfully delivered, the cord was clamped and cut, and the infant was handed over to the awaiting neonatology team. Uterine massage was then administered and the placenta delivered intact with three-vessel cord. The  uterus was then cleared of clot and debris. The hysterotomy was closed with 0 Vicryl in  a running locked fashion, and an imbricating layer was also placed with a 0 Vicryl. Overall, excellent hemostasis was noted. The abdomen and the pelvis were cleared of all clot and debris and the Colleen Terrell was removed. Hemostasis was confirmed on all surfaces. The peritoneum was reapproximated using 2-0 vicryl running stitches. The fascia was then closed using 0 Vicryl in a running fashion. The skin was closed with 4-0 vicryl. The patient tolerated the procedure well. Sponge, lap, instrument and needle counts were correct x 2. She was taken to the recovery room in stable condition.    Candelaria CelesteSTINSON, JACOB JEHIEL DO 09/13/2014 7:54 PM  Hospital Course:  Active Problems:  Gestational hypertension   Colleen Terrell is a 34 y.o. [id number] [facility] for routine OB follow up appointment. High risk clinic patient due to gHTN. Blood pressures were reported in the severe range and was admitted to L&D to await going to the OR until sufficient time had passed since she last ate. She had postpartum course that was complicated by elevated BPs. Given HCTZ and Norvasc.Marland Kitchen. POD#1 shadowing on pressure dressing outside previously marked area. Honeycomb and steri strips were saturated with dry blood. Scant blood at incision site on palpation. No erythema at incision site. Changed steri strips and honeycomb. The pt feels ready to go home and will be discharged with outpatient follow-up on POD#3.   Today: No acute events overnight. Pt denies problems voiding or po intake. She is ambulating, but still has discomfort at incision site while walking. She denies nausea, vomiting, headaches, dizziness, or changes in vision. Pain is moderately controlled. She has had flatus. She has had bowel movement. Lochia Minimal. Plan for birth control is  oral progesterone-only contraceptive. Method of  Feeding: Breastfeeding and bottle feeding.   BP 155/93 mmHg  Pulse 79  Temp(Src) 98.1 F (36.7 C) (Oral)  Resp 18  Ht 5\' 8"  (1.727 m)  Wt 115.35 kg (254 lb 4.8 oz)  BMI 38.68 kg/m2  SpO2 100%  LMP 12/27/2013  Breastfeeding? Unknown  Physical Exam:  General: alert, cooperative and no distress  CV: heart sounds nml PULM: breath sounds nml Lochia: appropriate Uterine Fundus: firm Incision: healing well, minimal dried blood on honeycomb, no ertythema DVT Evaluation: 1+ pitting edema in feet b/l. No tenderness to palpation of posterior calf.   Discharge Diagnoses: Term Pregnancy-delivered and via LTCS secondary to breech/vertex presentation of di-di twins and gHTN with severe range BPs.  Discharge Information: Date: 09/16/2014 Activity: pelvic rest Diet: routine Medications: PNV and Colace Condition: stable Instructions: refer to practice specific booklet Discharge to: home Follow-up Information    [facility] Schedule an appointment as soon as possible for a visit in 4 weeks.   Why: The [facility] nurse will come to your home in 1 week, then you will need to make a postpartum appointment for 4 weeks at the clinc.   Contact information:   [address] [place] [id number] [phone]      Newborn Data:   Colleen Terrell, BoyA Colleen Terrell [[id number]][[id number]]  Live born female  Birth Weight: 7 lb 3 oz (3260 g) APGAR: 9, [address]   Colleen Terrell, GirlB Colleen Terrell [[id number]][[id number]]  Live born female  Birth Weight: 6 lb 7.7 oz (2940 g) APGAR: 8, 9  Home with mother.  Arvilla MeresMeyer, Ashley L PA-S 09/16/2014, 10:48 AM    I have seen and examined this patient and I agree with the above. Alley Neils,  [facility]  CNM  12:47 AM 09/23/2014

## 2014-09-29 ENCOUNTER — Ambulatory Visit (INDEPENDENT_AMBULATORY_CARE_PROVIDER_SITE_OTHER): Payer: Medicaid Other | Admitting: Physician Assistant

## 2014-09-29 VITALS — BP 147/87 | HR 86 | Temp 98.4°F

## 2014-09-29 DIAGNOSIS — Z5189 Encounter for other specified aftercare: Secondary | ICD-10-CM

## 2014-09-29 NOTE — Progress Notes (Signed)
Pt has wound check today, blood pressure elevated, pt has not taken her medication today.   Denies any headache, blurred vision, or epigastic pain  Pt reports 2 days ago, there was some yellowish, foul smelling discharge from incision.  Yesterday, she noticed blood that was "light red."   On exam today, her incision appears to be well healing.  She does have a length of 1cm that appears to be slightly opened to a width of 1mm.  No draining, no erythema or warmth.    Pt advised to continue taking BP meds as prescribed.   Will reassess in 2 weeks at postpartum visit.  Call or come to [facility] for acute worsening of sxs.

## 2014-09-29 NOTE — Patient Instructions (Signed)

## 2014-10-22 ENCOUNTER — Ambulatory Visit (INDEPENDENT_AMBULATORY_CARE_PROVIDER_SITE_OTHER): Payer: Medicaid Other | Admitting: Advanced Practice Midwife

## 2014-10-22 ENCOUNTER — Encounter: Payer: Self-pay | Admitting: Advanced Practice Midwife

## 2014-10-22 VITALS — BP 114/85 | HR 93 | Temp 98.4°F | Ht 66.0 in | Wt 244.6 lb

## 2014-10-22 DIAGNOSIS — Z3042 Encounter for surveillance of injectable contraceptive: Secondary | ICD-10-CM

## 2014-10-22 LAB — POCT PREGNANCY, URINE: Preg Test, Ur: NEGATIVE

## 2014-10-22 MED ORDER — MEDROXYPROGESTERONE ACETATE 104 MG/0.65ML ~~LOC~~ SUSP
104.0000 mg | Freq: Once | SUBCUTANEOUS | Status: AC
  Administered 2014-10-22: 104 mg via SUBCUTANEOUS

## 2014-10-22 NOTE — Patient Instructions (Signed)
You may stop your blood pressure medicine.   You may need two or three Silver Nitrate treatments to remove the granulation tissue from you incision. Your incision is healing well.  Contraception Choices Contraception (birth control) is the use of any methods or devices to prevent pregnancy. Below are some methods to help avoid pregnancy. HORMONAL METHODS   Contraceptive implant. This is a thin, plastic tube containing progesterone hormone. It does not contain estrogen hormone. Your health care provider inserts the tube in the inner part of the upper arm. The tube can remain in place for up to 3 years. After 3 years, the implant must be removed. The implant prevents the ovaries from releasing an egg (ovulation), thickens the cervical mucus to prevent sperm from entering the uterus, and thins the lining of the inside of the uterus.  Progesterone-only injections. These injections are given every 3 months by your health care provider to prevent pregnancy. This synthetic progesterone hormone stops the ovaries from releasing eggs. It also thickens cervical mucus and changes the uterine lining. This makes it harder for sperm to survive in the uterus.  Birth control pills. These pills contain estrogen and progesterone hormone. They work by preventing the ovaries from releasing eggs (ovulation). They also cause the cervical mucus to thicken, preventing the sperm from entering the uterus. Birth control pills are prescribed by a health care provider.Birth control pills can also be used to treat heavy periods.  Minipill. This type of birth control pill contains only the progesterone hormone. They are taken every day of each month and must be prescribed by your health care provider.  Birth control patch. The patch contains hormones similar to those in birth control pills. It must be changed once a week and is prescribed by a health care provider.  Vaginal ring. The ring contains hormones similar to those in  birth control pills. It is left in the vagina for 3 weeks, removed for 1 week, and then a new one is put back in place. The patient must be comfortable inserting and removing the ring from the vagina.A health care provider's prescription is necessary.  Emergency contraception. Emergency contraceptives prevent pregnancy after unprotected sexual intercourse. This pill can be taken right after sex or up to 5 days after unprotected sex. It is most effective the sooner you take the pills after having sexual intercourse. Most emergency contraceptive pills are available without a prescription. Check with your pharmacist. Do not use emergency contraception as your only form of birth control. BARRIER METHODS   Female condom. This is a thin sheath (latex or rubber) that is worn over the penis during sexual intercourse. It can be used with spermicide to increase effectiveness.  Female condom. This is a soft, loose-fitting sheath that is put into the vagina before sexual intercourse.  Diaphragm. This is a soft, latex, dome-shaped barrier that must be fitted by a health care provider. It is inserted into the vagina, along with a spermicidal jelly. It is inserted before intercourse. The diaphragm should be left in the vagina for 6 to 8 hours after intercourse.  Cervical cap. This is a round, soft, latex or plastic cup that fits over the cervix and must be fitted by a health care provider. The cap can be left in place for up to 48 hours after intercourse.  Sponge. This is a soft, circular piece of polyurethane foam. The sponge has spermicide in it. It is inserted into the vagina after wetting it and before sexual intercourse.  Spermicides. These are chemicals that kill or block sperm from entering the cervix and uterus. They come in the form of creams, jellies, suppositories, foam, or tablets. They do not require a prescription. They are inserted into the vagina with an applicator before having sexual intercourse.  The process must be repeated every time you have sexual intercourse. INTRAUTERINE CONTRACEPTION  Intrauterine device (IUD). This is a T-shaped device that is put in a woman's uterus during a menstrual period to prevent pregnancy. There are 2 types:  Copper IUD. This type of IUD is wrapped in copper wire and is placed inside the uterus. Copper makes the uterus and fallopian tubes produce a fluid that kills sperm. It can stay in place for 10 years.  Hormone IUD. This type of IUD contains the hormone progestin (synthetic progesterone). The hormone thickens the cervical mucus and prevents sperm from entering the uterus, and it also thins the uterine lining to prevent implantation of a fertilized egg. The hormone can weaken or kill the sperm that get into the uterus. It can stay in place for 3-5 years, depending on which type of IUD is used. PERMANENT METHODS OF CONTRACEPTION  Female tubal ligation. This is when the woman's fallopian tubes are surgically sealed, tied, or blocked to prevent the egg from traveling to the uterus.  Hysteroscopic sterilization. This involves placing a small coil or insert into each fallopian tube. Your doctor uses a technique called hysteroscopy to do the procedure. The device causes scar tissue to form. This results in permanent blockage of the fallopian tubes, so the sperm cannot fertilize the egg. It takes about 3 months after the procedure for the tubes to become blocked. You must use another form of birth control for these 3 months.  Female sterilization. This is when the female has the tubes that carry sperm tied off (vasectomy).This blocks sperm from entering the vagina during sexual intercourse. After the procedure, the man can still ejaculate fluid (semen). NATURAL PLANNING METHODS  Natural family planning. This is not having sexual intercourse or using a barrier method (condom, diaphragm, cervical cap) on days the woman could become pregnant.  Calendar method. This  is keeping track of the length of each menstrual cycle and identifying when you are fertile.  Ovulation method. This is avoiding sexual intercourse during ovulation.  Symptothermal method. This is avoiding sexual intercourse during ovulation, using a thermometer and ovulation symptoms.  Post-ovulation method. This is timing sexual intercourse after you have ovulated. Regardless of which type or method of contraception you choose, it is important that you use condoms to protect against the transmission of sexually transmitted infections (STIs). Talk with your health care provider about which form of contraception is most appropriate for you. Document Released: 08/13/2005 Document Revised: 08/18/2013 Document Reviewed: 02/05/2013 [facility] Patient Information 2015 [place], [place]. This information is not intended to replace advice given to you by your health care provider. Make sure you discuss any questions you have with your health care provider.

## 2014-10-27 NOTE — Progress Notes (Addendum)
Subjective:   uncom  Colleen Terrell is a 34 y.o. female who presents for a postpartum visit. She is 6 weeks postpartum following a low cervical transverse Cesarean section for breech twin A. I have fully reviewed the prenatal and intrapartum course. The delivery was at 37.[redacted] weeks gestational weeks. Outcome: primary cesarean section, low transverse incision. Anesthesia: spinal. Postpartum course has been complicated by small Wound separation. Babies' courses have been uncomplicated. Baby is feeding by breast. Bleeding no bleeding. Bowel function is normal. Bladder function is normal. Patient is not sexually active. Contraception method is abstinence. Postpartum depression screening: negative.  The following portions of the patient's history were reviewed and updated as appropriate: allergies, current medications, past family history, past medical history, past social history, past surgical history and problem list.  Review of Systems concern for remaining opening in incision.    Objective:    BP 114/85 mmHg  Pulse 93  Temp(Src) 98.4 F (36.9 C) (Oral)  Ht 5\' 6"  (1.676 m)  Wt 244 lb 9.6 oz (110.95 kg)  BMI 39.50 kg/m2  Breastfeeding? Yes  General:  alert, appears stated age, no distress and moderately obese   Breasts:  inspection negative, no nipple discharge or bleeding, no masses or nodularity palpable  Lungs: clear to auscultation bilaterally  Heart:  regular rate and rhythm, S1, S2 normal, no murmur, click, rub or gallop  Abdomen: soft, non-tender; bowel sounds normal; no masses,  no organomegaly and incision helaing well. No dehiscence. Two areas of granulation tissue protruding from healed incision. No bleeding or drainage .    Vulva:  not evaluated  Vagina: not evaluated               Procedure: Silver nitrate applied to granulation tissue. Pt tolerated well.   Assessment:    Granulation tissue on well-haled C/S incision. Otherwise normal postpartum exam. Pap smear not  done at today's visit.   Plan:    1. Contraception: Depo-Provera injections and considering Mirena. Info given.  2. Silver Nitrate Q 2 weeks PRN per Dr. Jolayne Pantheronstant.

## 2014-11-05 ENCOUNTER — Ambulatory Visit (INDEPENDENT_AMBULATORY_CARE_PROVIDER_SITE_OTHER): Payer: Medicaid Other | Admitting: Obstetrics & Gynecology

## 2014-11-05 ENCOUNTER — Encounter: Payer: Self-pay | Admitting: Obstetrics & Gynecology

## 2014-11-05 VITALS — BP 133/82 | HR 78 | Wt 246.8 lb

## 2014-11-05 DIAGNOSIS — Z9889 Other specified postprocedural states: Secondary | ICD-10-CM

## 2014-11-05 NOTE — Progress Notes (Signed)
Subjective:     Patient ID: Colleen Terrell, female   DOB: 1981-01-29, 34 y.o.   MRN: [id number]  HPI Pt here to have incision checked.  Was getting silver nitrate placed. No complaints.   Review of Systems     Objective:   Physical Exam BP 133/82 mmHg  Pulse 78  Wt 246 lb 12.8 oz (111.948 kg)  Incision: clean, dry and intact     Assessment:     Post op check - incision well healed    Plan:     F/u prn

## 2014-11-05 NOTE — Patient Instructions (Signed)

## 2015-01-12 ENCOUNTER — Ambulatory Visit: Payer: Medicaid Other

## 2015-08-05 ENCOUNTER — Encounter (HOSPITAL_BASED_OUTPATIENT_CLINIC_OR_DEPARTMENT_OTHER): Payer: Self-pay | Admitting: Emergency Medicine

## 2015-08-05 ENCOUNTER — Emergency Department (HOSPITAL_BASED_OUTPATIENT_CLINIC_OR_DEPARTMENT_OTHER)
Admission: EM | Admit: 2015-08-05 | Discharge: 2015-08-05 | Disposition: A | Discharge status: Home / Self Care | Payer: Medicaid Other | Attending: Emergency Medicine | Admitting: Emergency Medicine

## 2015-08-05 DIAGNOSIS — J3489 Other specified disorders of nose and nasal sinuses: Secondary | ICD-10-CM | POA: Insufficient documentation

## 2015-08-05 DIAGNOSIS — Z8719 Personal history of other diseases of the digestive system: Secondary | ICD-10-CM | POA: Insufficient documentation

## 2015-08-05 DIAGNOSIS — Z79899 Other long term (current) drug therapy: Secondary | ICD-10-CM | POA: Insufficient documentation

## 2015-08-05 DIAGNOSIS — G44209 Tension-type headache, unspecified, not intractable: Secondary | ICD-10-CM | POA: Insufficient documentation

## 2015-08-05 MED ORDER — ACETAMINOPHEN 500 MG PO TABS
1000.0000 mg | ORAL_TABLET | Freq: Once | ORAL | Status: AC
  Administered 2015-08-05: 1000 mg via ORAL
  Filled 2015-08-05: qty 2

## 2015-08-05 MED ORDER — ACETAMINOPHEN 500 MG PO TABS: 1000.0000 mg | ORAL_TABLET | Freq: Four times a day (QID) | ORAL | Status: DC | PRN

## 2015-08-05 MED ORDER — MOMETASONE FUROATE 50 MCG/ACT NA SUSP: 2.0000 | Freq: Two times a day (BID) | NASAL | Status: DC

## 2015-08-05 NOTE — ED Notes (Signed)
Pt took 2 baby aspirin at 530pm today with minimal relief.

## 2015-08-05 NOTE — ED Provider Notes (Signed)
CSN: [id number]     Arrival date & time 08/05/15  1918 History  By signing my name below, I, [name], attest that this documentation has been prepared under the direction and in the presence of Lyndal Pulleyaniel Macari Zalesky, MD. Electronically Signed: [name], ED Scribe. 08/05/2015. 8:01 PM.    Chief Complaint  Patient presents with  . Headache   Patient is a 34 y.o. female presenting with headaches. The history is provided by the patient. No language interpreter was used.  Headache Pain location:  Frontal, L temporal, R temporal and occipital Quality:  Dull Radiates to:  Does not radiate Onset quality:  Gradual Duration:  3 days Timing:  Constant Progression:  Unchanged Chronicity:  New Similar to prior headaches: no   Context: not exposure to bright light   Relieved by:  Aspirin Associated symptoms: no vomiting   Risk factors comment:  No h/o migraine   HPI Comments: Colleen Terrell is a 34 y.o. female with h/o gestational HTN who presents to the Emergency Department complaining of moderate frontal, temporal and occipital HA onset 3 days ago. Pt states she has taken ASA with mild to moderate relief. No other modifying factors noted. No h/o migraine. She denies emesis, additional symptoms.   Past Medical History  Diagnosis Date  . Unspecified hemorrhoids without mention of complication   . History of kidney infection during pregnancy   . PP NVB 02/02/12 02/02/2012  . GERD (gastroesophageal reflux disease)    Past Surgical History  Procedure Laterality Date  . Cesarean section N/A 09/13/2014    Procedure: CESAREAN SECTION;  Surgeon: Levie HeritageJacob J Stinson, DO;  Location: [facility];  Service: Obstetrics;  Laterality: N/A;   Family History  Problem Relation Age of Onset  . Hypertension Father   . Anesthesia problems Neg Hx   . Hypertension Mother    Social History  Substance Use Topics  . Smoking status: Never Smoker   . Smokeless tobacco: Never Used  . Alcohol Use: No   OB History     Gravida Para Term Preterm AB TAB SAB Ectopic Multiple Living   3 3 3      1 4      Review of Systems  Gastrointestinal: Negative for vomiting.  Neurological: Positive for headaches.  All other systems reviewed and are negative.  Allergies  Review of patient's allergies indicates no known allergies.  Home Medications   Prior to Admission medications   Medication Sig Start Date End Date Taking? Authorizing Provider  amLODipine (NORVASC) 5 MG tablet Take 5 mg by mouth daily.   Yes Historical Provider, MD  hydrochlorothiazide (HYDRODIURIL) 25 MG tablet Take 1 tablet (25 mg total) by mouth daily. 09/16/14  Yes Arabella MerlesKimberly D Shaw, CNM  Prenatal Vit-Fe Fumarate-FA (MULTIVITAMIN-PRENATAL) 27-0.8 MG TABS tablet Take 1 tablet by mouth daily. 06/10/14   Adam PhenixJames G Arnold, MD   BP 130/93 mmHg  Pulse 89  Temp(Src) 97.8 F (36.6 C) (Oral)  Resp 18  Ht 5\' 9"  (1.753 m)  Wt 248 lb (112.492 kg)  BMI 36.61 kg/m2  SpO2 99%  LMP 07/29/2015 (Exact Date)  Breastfeeding? Yes Physical Exam  Constitutional: She is oriented to person, place, and time. She appears well-developed and well-nourished. No distress.  HENT:  Head: Normocephalic.  Nose: Left sinus exhibits maxillary sinus tenderness (mild).  Eyes: Conjunctivae are normal.  Neck: Neck supple. No tracheal deviation present.  Cardiovascular: Normal rate and regular rhythm.   Pulmonary/Chest: Effort normal. No respiratory distress.  Abdominal: Soft. She  exhibits no distension.  Neurological: She is alert and oriented to person, place, and time. No cranial nerve deficit. Coordination and gait normal.  Normal finger to nose testing and rapid alternating movement  Skin: Skin is warm and dry.  Psychiatric: She has a normal mood and affect.  Nursing note and vitals reviewed.  ED Course  Procedures (including critical care time) DIAGNOSTIC STUDIES: Oxygen Saturation is 99% on RA, normal by my interpretation.    COORDINATION OF CARE: 8:00 PM  Discussed treatment plan with pt at bedside and pt agreed to plan. Plan to manage pain in the ED with Tylenol.    MDM   Final diagnoses:  Tension-type headache, not intractable, unspecified chronicity pattern  Sinus pressure    34 year old female presents with a headache over the last 3 days. She has only taken a baby aspirin without any relief. She is concerned about her blood pressure. I recommended the patient not take her blood pressure when she was symptomatic from her headache. I recommended that she take Tylenol or another headache medicine regularly until symptoms subside. She does appear to have some mild sinus tenderness and was given nasal spray to help with congestion.  I personally performed the services described in this documentation, which was scribed in my presence. The recorded information has been reviewed and is accurate.         Lyndal Pulley, MD 08/05/15 2014

## 2015-08-05 NOTE — Discharge Instructions (Signed)
Tension Headache A tension headache is pain, pressure, or aching that is felt over the front and sides of your head. These headaches can last from 30 minutes to several days. HOME CARE Managing Pain  Take over-the-counter and prescription medicines only as told by your doctor.  Lie down in a dark, quiet room when you have a headache.  If directed, apply ice to your head and neck area:  Put ice in a plastic bag.  Place a towel between your skin and the bag.  Leave the ice on for 20 minutes, 2-3 times per day.  Use a heating pad or a hot shower to apply heat to your head and neck area as told by your doctor. Eating and Drinking  Eat meals on a regular schedule.  Do not drink a lot of alcohol.  Do not use a lot of caffeine, or stop using caffeine. General Instructions  Keep all follow-up visits as told by your doctor. This is important.  Keep a journal to find out if certain things bring on headaches. For example, write down:  What you eat and drink.  How much sleep you get.  Any change to your diet or medicines.  Try getting a massage, or doing other things that help you to relax.  Lessen stress.  Sit up straight. Do not tighten (tense) your muscles.  Do not use tobacco products. This includes cigarettes, chewing tobacco, or e-cigarettes. If you need help quitting, ask your doctor.  Exercise regularly as told by your doctor.  Get enough sleep. This may mean 7-9 hours of sleep. GET HELP IF:  Your symptoms are not helped by medicine.  You have a headache that feels different from your usual headache.  You feel sick to your stomach (nauseous) or you throw up (vomit).  You have a fever. GET HELP RIGHT AWAY IF:  Your headache becomes very bad.  You keep throwing up.  You have a stiff neck.  You have trouble seeing.  You have trouble speaking.  You have pain in your eye or ear.  Your muscles are weak or you lose muscle control.  You lose your balance  or you have trouble walking.  You feel like you will pass out (faint) or you pass out.  You have confusion.   This information is not intended to replace advice given to you by your health care provider. Make sure you discuss any questions you have with your health care provider.   Document Released: 11/07/2009 Document Revised: 05/04/2015 Document Reviewed: 12/06/2014 Elsevier Interactive Patient Education 2016 [employer]  

## 2015-08-05 NOTE — ED Notes (Addendum)
Pt c/o headache for past 3 days. Pt reports high blood pressure at home for past 3 days and took BP meds that were rx'd for her during her past pregnancy.  Pt does not normally have issues with BP and does not regularly take any prescribed bp medication.  Pt denies any n/v/d, vision changes or other symptoms.

## 2016-01-23 ENCOUNTER — Encounter (HOSPITAL_BASED_OUTPATIENT_CLINIC_OR_DEPARTMENT_OTHER): Payer: Self-pay

## 2016-01-23 ENCOUNTER — Emergency Department (HOSPITAL_BASED_OUTPATIENT_CLINIC_OR_DEPARTMENT_OTHER)
Admission: EM | Admit: 2016-01-23 | Discharge: 2016-01-23 | Disposition: A | Discharge status: Home / Self Care | Payer: Medicaid Other | Attending: Emergency Medicine | Admitting: Emergency Medicine

## 2016-01-23 DIAGNOSIS — J0100 Acute maxillary sinusitis, unspecified: Secondary | ICD-10-CM | POA: Insufficient documentation

## 2016-01-23 DIAGNOSIS — H9209 Otalgia, unspecified ear: Secondary | ICD-10-CM | POA: Insufficient documentation

## 2016-01-23 MED ORDER — NAPROXEN 250 MG PO TABS
500.0000 mg | ORAL_TABLET | Freq: Once | ORAL | Status: AC
  Administered 2016-01-23: 500 mg via ORAL
  Filled 2016-01-23: qty 2

## 2016-01-23 MED ORDER — FLUTICASONE PROPIONATE 50 MCG/ACT NA SUSP: 1.0000 | Freq: Every day | NASAL | Status: DC

## 2016-01-23 MED ORDER — PSEUDOEPHEDRINE HCL 30 MG PO TABS: 30.0000 mg | ORAL_TABLET | ORAL | Status: DC | PRN

## 2016-01-23 MED ORDER — NAPROXEN 500 MG PO TABS: 500.0000 mg | ORAL_TABLET | Freq: Two times a day (BID) | ORAL | Status: DC

## 2016-01-23 NOTE — ED Provider Notes (Signed)
CSN: [id number]     Arrival date & time 01/23/16  1601 History   First MD Initiated Contact with Patient 01/23/16 1627     Chief Complaint  Patient presents with  . Facial Pain     (Consider location/radiation/quality/duration/timing/severity/associated sxs/prior Treatment) HPI Comments: Patient presents with acute onset of right facial pain with radiation to her jaw and ear starting 2 days ago. Patient describes pressure in the area of her right maxillary sinus. She had preceding nasal congestion. No fever, vomiting, vision change. She took Tylenol which helped her pain somewhat but returned this afternoon. No sore throat or pain in her neck. No facial swelling. Course is constant. Nothing makes symptoms worse.  The history is provided by the patient.    Past Medical History  Diagnosis Date  . Unspecified hemorrhoids without mention of complication   . History of kidney infection during pregnancy   . PP NVB 02/02/12 02/02/2012  . GERD (gastroesophageal reflux disease)    Past Surgical History  Procedure Laterality Date  . Cesarean section N/A 09/13/2014    Procedure: CESAREAN SECTION;  Surgeon: Levie HeritageJacob J Stinson, DO;  Location: [facility];  Service: Obstetrics;  Laterality: N/A;   Family History  Problem Relation Age of Onset  . Hypertension Father   . Anesthesia problems Neg Hx   . Hypertension Mother    Social History  Substance Use Topics  . Smoking status: Never Smoker   . Smokeless tobacco: Never Used  . Alcohol Use: No   OB History    Gravida Para Term Preterm AB TAB SAB Ectopic Multiple Living   3 3 3      1 4      Review of Systems  Constitutional: Negative for fever, chills and fatigue.  HENT: Positive for congestion, ear pain, facial swelling and sinus pressure. Negative for rhinorrhea and sore throat.   Eyes: Negative for redness.  Respiratory: Negative for cough and wheezing.   Gastrointestinal: Negative for nausea, vomiting, abdominal pain and diarrhea.   Genitourinary: Negative for dysuria.  Musculoskeletal: Negative for myalgias and neck stiffness.  Skin: Negative for rash.  Neurological: Negative for headaches.  Hematological: Negative for adenopathy.      Allergies  Review of patient's allergies indicates no known allergies.  Home Medications   Prior to Admission medications   Medication Sig Start Date End Date Taking? Authorizing Provider  acetaminophen (TYLENOL) 500 MG tablet Take 2 tablets (1,000 mg total) by mouth every 6 (six) hours as needed for headache. 08/05/15   Lyndal Pulleyaniel Knott, MD  fluticasone (FLONASE) 50 MCG/ACT nasal spray Place 1 spray into both nostrils daily. 01/23/16   Renne CriglerJoshua Rashonda Warrior, PA-C  naproxen (NAPROSYN) 500 MG tablet Take 1 tablet (500 mg total) by mouth 2 (two) times daily. 01/23/16   Renne CriglerJoshua Adley Castello, PA-C  pseudoephedrine (SUDAFED) 30 MG tablet Take 1 tablet (30 mg total) by mouth every 4 (four) hours as needed for congestion. 01/23/16   Renne CriglerJoshua Zayn Selley, PA-C   BP 168/99 mmHg  Pulse 81  Temp(Src) 98.5 F (36.9 C) (Oral)  Resp 18  Ht 5\' 10"  (1.778 m)  Wt 113.399 kg  BMI 35.87 kg/m2  SpO2 100%  LMP 01/19/2016 Physical Exam  Constitutional: She appears well-developed and well-nourished.  HENT:  Head: Normocephalic and atraumatic.  Right Ear: Tympanic membrane, external ear and ear canal normal.  Left Ear: Tympanic membrane, external ear and ear canal normal.  Nose: Mucosal edema present. No rhinorrhea. Right sinus exhibits maxillary sinus tenderness. Right sinus exhibits no  frontal sinus tenderness. Left sinus exhibits no maxillary sinus tenderness and no frontal sinus tenderness.  Mouth/Throat: Uvula is midline, oropharynx is clear and moist and mucous membranes are normal. Mucous membranes are not dry. No oral lesions. No trismus in the jaw. No uvula swelling. No oropharyngeal exudate, posterior oropharyngeal edema, posterior oropharyngeal erythema or tonsillar abscesses.  Eyes: Conjunctivae are normal. Right  eye exhibits no discharge. Left eye exhibits no discharge.  Neck: Normal range of motion. Neck supple.  Cardiovascular: Normal rate, regular rhythm and normal heart sounds.   Pulmonary/Chest: Effort normal and breath sounds normal. No respiratory distress. She has no wheezes. She has no rales.  Abdominal: Soft. There is no tenderness.  Lymphadenopathy:    She has no cervical adenopathy.  Neurological: She is alert.  Skin: Skin is warm and dry.  Psychiatric: She has a normal mood and affect.  Nursing note and vitals reviewed.   ED Course  Procedures (including critical care time)  5:07 PM Patient seen and examined.   Vital signs reviewed and are as follows: BP 168/99 mmHg  Pulse 81  Temp(Src) 98.5 F (36.9 C) (Oral)  Resp 18  Ht  (1.778 m)  Wt 113.399 kg  BMI 35.87 kg/m2  SpO2 100%  LMP 01/19/2016  Patient counseled on supportive care for viral URI and s/s to return including worsening symptoms, Uncontrolled pain, facial swelling or redness, persistent fever, persistent vomiting, or if they have any other concerns. Urged to see PCP if symptoms persist for more than 3 days. Patient verbalizes understanding and agrees with plan.    MDM   Final diagnoses:  Acute maxillary sinusitis, recurrence not specified   Patient with pressure over the right maxillary sinus with radiation to other branches of the facial nerve. No dental abscess seen. Full range of motion of eyes without pain. Normal ear exam. No fever or systemic symptoms. Will treat as sinusitis.    Renne Crigler, PA-C 01/23/16 1708  Linwood Dibbles, MD 01/23/16 [phone]

## 2016-01-23 NOTE — Discharge Instructions (Signed)
Please read and follow all provided instructions.  Your diagnoses today include:  1. Acute maxillary sinusitis, recurrence not specified     You appear to have an upper respiratory infection (URI). An upper respiratory tract infection, or cold, is a viral infection of the air passages leading to the lungs. It should improve gradually after 5-7 days. You may have a lingering cough that lasts for 2- 4 weeks after the infection.  Tests performed today include:  Vital signs. See below for your results today.   Medications prescribed:   Naproxen - anti-inflammatory pain medication  Do not exceed 500mg  naproxen every 12 hours, take with food  You have been prescribed an anti-inflammatory medication or NSAID. Take with food. Take smallest effective dose for the shortest duration needed for your pain. Stop taking if you experience stomach pain or vomiting.    Pseudoephedrine - decongestant medication to help with nasal congestion  Take any prescribed medications only as directed. Treatment for your infection is aimed at treating the symptoms. There are no medications, such as antibiotics, that will cure your infection.   Home care instructions:  Follow any educational materials contained in this packet.   Your illness is contagious and can be spread to others, especially during the first 3 or 4 days. It cannot be cured by antibiotics or other medicines. Take basic precautions such as washing your hands often, covering your mouth when you cough or sneeze, and avoiding public places where you could spread your illness to others.   Please continue drinking plenty of fluids.  Use over-the-counter medicines as needed as directed on packaging for symptom relief.  You may also use ibuprofen or tylenol as directed on packaging for pain or fever.  Do not take multiple medicines containing Tylenol or acetaminophen to avoid taking too much of this medication.  Follow-up instructions: Please follow-up  with your primary care provider in the next 3 days for further evaluation of your symptoms if you are not feeling better.   Return instructions:   Please return to the Emergency Department if you experience worsening symptoms.   RETURN IMMEDIATELY IF you develop shortness of breath, confusion or altered mental status, a new rash, become dizzy, faint, or poorly responsive, or are unable to be cared for at home.  Please return if you have persistent vomiting and cannot keep down fluids or develop a fever that is not controlled by tylenol or motrin.    Please return if you have any other emergent concerns.  Additional Information:  Your vital signs today were: BP 168/99 mmHg   Pulse 81   Temp(Src) 98.5 F (36.9 C) (Oral)   Resp 18   Ht 5\' 10"  (1.778 m)   Wt 113.399 kg   BMI 35.87 kg/m2   SpO2 100%   LMP 01/19/2016 If your blood pressure (BP) was elevated above 135/85 this visit, please have this repeated by your doctor within one month. --------------

## 2016-01-23 NOTE — ED Notes (Signed)
C/o right side facial pain x 2 days-NAD-steady gait

## 2017-08-14 ENCOUNTER — Ambulatory Visit: Payer: Self-pay | Admitting: General Practice

## 2017-08-14 DIAGNOSIS — Z3201 Encounter for pregnancy test, result positive: Secondary | ICD-10-CM

## 2017-08-14 LAB — POCT PREGNANCY, URINE: Preg Test, Ur: POSITIVE — AB

## 2017-08-14 NOTE — Progress Notes (Signed)
Agree with nursing staff's documentation of this patient's clinic encounter.  Kasie Leccese, MD 08/14/2017 4:17 PM    

## 2017-08-14 NOTE — Progress Notes (Signed)
Patient here for UPT today. UPT +. Patient reports first positive home test 2 weeks ago. LMP 07/04/17 EDD 04/10/18 1366w6d. Patient informed. Asked patient about meds or vitamins. Patient reports taking only Women's one a day. Recommended she change to PNV & begin OB care. Patient verbalized understanding and states with her last pregnancy with twins around 6 weeks and this pregnancy as well she had some spotting. Told patient that can be normal especially in early pregnancy and that is okay as long as she isn't having bleeding like a period, which would require evaluation. Patient verbalized understanding & had no questions

## 2017-08-24 ENCOUNTER — Encounter (HOSPITAL_COMMUNITY): Payer: Self-pay | Admitting: *Deleted

## 2017-08-24 ENCOUNTER — Inpatient Hospital Stay (HOSPITAL_COMMUNITY)
Admission: AD | Admit: 2017-08-24 | Discharge: 2017-08-24 | Disposition: A | Discharge status: Home / Self Care | Payer: Medicaid Other | Source: Ambulatory Visit | Attending: Obstetrics & Gynecology | Admitting: Obstetrics & Gynecology

## 2017-08-24 ENCOUNTER — Inpatient Hospital Stay (HOSPITAL_COMMUNITY): Payer: Medicaid Other

## 2017-08-24 ENCOUNTER — Other Ambulatory Visit: Payer: Self-pay

## 2017-08-24 DIAGNOSIS — O30041 Twin pregnancy, dichorionic/diamniotic, first trimester: Secondary | ICD-10-CM

## 2017-08-24 DIAGNOSIS — O09891 Supervision of other high risk pregnancies, first trimester: Secondary | ICD-10-CM | POA: Diagnosis not present

## 2017-08-24 DIAGNOSIS — R030 Elevated blood-pressure reading, without diagnosis of hypertension: Secondary | ICD-10-CM | POA: Insufficient documentation

## 2017-08-24 DIAGNOSIS — O26891 Other specified pregnancy related conditions, first trimester: Secondary | ICD-10-CM | POA: Insufficient documentation

## 2017-08-24 DIAGNOSIS — N939 Abnormal uterine and vaginal bleeding, unspecified: Secondary | ICD-10-CM | POA: Diagnosis present

## 2017-08-24 DIAGNOSIS — R109 Unspecified abdominal pain: Secondary | ICD-10-CM

## 2017-08-24 DIAGNOSIS — R102 Pelvic and perineal pain: Secondary | ICD-10-CM | POA: Diagnosis present

## 2017-08-24 DIAGNOSIS — Z3A01 Less than 8 weeks gestation of pregnancy: Secondary | ICD-10-CM | POA: Diagnosis not present

## 2017-08-24 DIAGNOSIS — Z3490 Encounter for supervision of normal pregnancy, unspecified, unspecified trimester: Secondary | ICD-10-CM

## 2017-08-24 DIAGNOSIS — O209 Hemorrhage in early pregnancy, unspecified: Secondary | ICD-10-CM | POA: Diagnosis not present

## 2017-08-24 DIAGNOSIS — O26899 Other specified pregnancy related conditions, unspecified trimester: Secondary | ICD-10-CM

## 2017-08-24 LAB — CBC
HEMATOCRIT: 35.6 % — AB (ref 36.0–46.0)
HEMOGLOBIN: 11.7 g/dL — AB (ref 12.0–15.0)
MCH: 27.3 pg (ref 26.0–34.0)
MCHC: 32.9 g/dL (ref 30.0–36.0)
MCV: 83 fL (ref 78.0–100.0)
Platelets: 300 10*3/uL (ref 150–400)
RBC: 4.29 MIL/uL (ref 3.87–5.11)
RDW: 15.4 % (ref 11.5–15.5)
WBC: 10.1 10*3/uL (ref 4.0–10.5)

## 2017-08-24 LAB — URINALYSIS, ROUTINE W REFLEX MICROSCOPIC
Bilirubin Urine: NEGATIVE
Glucose, UA: NEGATIVE mg/dL
Ketones, ur: NEGATIVE mg/dL
Leukocytes, UA: NEGATIVE
NITRITE: NEGATIVE
Protein, ur: NEGATIVE mg/dL
SPECIFIC GRAVITY, URINE: 1.016 (ref 1.005–1.030)
pH: 6 (ref 5.0–8.0)

## 2017-08-24 LAB — WET PREP, GENITAL
SPERM: NONE SEEN
Trich, Wet Prep: NONE SEEN
YEAST WET PREP: NONE SEEN

## 2017-08-24 LAB — HCG, QUANTITATIVE, PREGNANCY: hCG, Beta Chain, Quant, S: 179105 m[IU]/mL — ABNORMAL HIGH (ref <5)

## 2017-08-24 LAB — POCT PREGNANCY, URINE: PREG TEST UR: POSITIVE — AB

## 2017-08-24 NOTE — Discharge Instructions (Signed)

## 2017-08-24 NOTE — MAU Provider Note (Signed)
History     CSN: [id number]  Arrival date and time: 08/24/17 0101   First Provider Initiated Contact with Patient 08/24/17 0224      Chief Complaint  Patient presents with  . Vaginal Bleeding   Vaginal Bleeding  The patient's primary symptoms include pelvic pain and vaginal bleeding. This is a new problem. The current episode started in the past 7 days. The problem occurs intermittently. The problem has been gradually worsening. Pain severity now: 8/10. The problem affects both sides. She is pregnant. Associated symptoms include dysuria and nausea. Pertinent negatives include no chills, fever, frequency, urgency or vomiting. The vaginal discharge was brown. The vaginal bleeding is lighter than menses. She has not been passing clots. She has not been passing tissue. Nothing aggravates the symptoms. She has tried nothing for the symptoms. Menstrual history: LMP 07/04/17.    Past Medical History:  Diagnosis Date  . GERD (gastroesophageal reflux disease)   . History of kidney infection during pregnancy   . PP NVB 02/02/12 02/02/2012  . Unspecified hemorrhoids without mention of complication     Past Surgical History:  Procedure Laterality Date  . CESAREAN SECTION N/A 09/13/2014   Procedure: CESAREAN SECTION;  Surgeon: Levie Heritage, DO;  Location: [facility];  Service: Obstetrics;  Laterality: N/A;    Family History  Problem Relation Age of Onset  . Hypertension Father   . Hypertension Mother   . Anesthesia problems Neg Hx     Social History   Tobacco Use  . Smoking status: Never Smoker  . Smokeless tobacco: Never Used  Substance Use Topics  . Alcohol use: No  . Drug use: No    Allergies: No Known Allergies  Medications Prior to Admission  Medication Sig Dispense Refill Last Dose  . Multiple Vitamins-Calcium (ONE-A-DAY WOMENS FORMULA PO) Take 1 tablet by mouth.   08/23/2017 at Unknown time  . acetaminophen (TYLENOL) 500 MG tablet Take 2 tablets (1,000 mg total) by mouth  every 6 (six) hours as needed for headache. 30 tablet 0   . fluticasone (FLONASE) 50 MCG/ACT nasal spray Place 1 spray into both nostrils daily. 16 g 2   . naproxen (NAPROSYN) 500 MG tablet Take 1 tablet (500 mg total) by mouth 2 (two) times daily. 20 tablet 0   . pseudoephedrine (SUDAFED) 30 MG tablet Take 1 tablet (30 mg total) by mouth every 4 (four) hours as needed for congestion. 12 tablet 0     Review of Systems  Constitutional: Negative for chills and fever.  Gastrointestinal: Positive for nausea. Negative for vomiting.  Genitourinary: Positive for dysuria, pelvic pain and vaginal bleeding. Negative for frequency and urgency.   Physical Exam   Blood pressure (!) 149/83, pulse 76, temperature 98.2 F (36.8 C), resp. rate 20, last menstrual period 07/04/2017, currently breastfeeding.  Physical Exam  Nursing note and vitals reviewed. Constitutional: She is oriented to person, place, and time. She appears well-developed and well-nourished. No distress.  HENT:  Head: Normocephalic.  Cardiovascular: Normal rate.  Respiratory: Effort normal.  GI: Soft. There is no tenderness. There is no rebound.  Neurological: She is alert and oriented to person, place, and time.  Skin: Skin is warm and dry.  Psychiatric: She has a normal mood and affect.   Results for orders placed or performed during the hospital encounter of 08/24/17 (from the past 24 hour(s))  Urinalysis, Routine w reflex microscopic     Status: Abnormal   Collection Time: 08/24/17  1:19 AM  Result  Value Ref Range   Color, Urine YELLOW YELLOW   APPearance CLEAR CLEAR   Specific Gravity, Urine 1.016 1.005 - 1.030   pH 6.0 5.0 - 8.0   Glucose, UA NEGATIVE NEGATIVE mg/dL   Hgb urine dipstick MODERATE (A) NEGATIVE   Bilirubin Urine NEGATIVE NEGATIVE   Ketones, ur NEGATIVE NEGATIVE mg/dL   Protein, ur NEGATIVE NEGATIVE mg/dL   Nitrite NEGATIVE NEGATIVE   Leukocytes, UA NEGATIVE NEGATIVE   RBC / HPF 0-5 0 - 5 RBC/hpf   WBC,  UA 0-5 0 - 5 WBC/hpf   Bacteria, UA RARE (A) NONE SEEN   Squamous Epithelial / LPF 0-5 (A) NONE SEEN   Mucus PRESENT   Pregnancy, urine POC     Status: Abnormal   Collection Time: 08/24/17  1:41 AM  Result Value Ref Range   Preg Test, Ur POSITIVE (A) NEGATIVE  Wet prep, genital     Status: Abnormal   Collection Time: 08/24/17  2:08 AM  Result Value Ref Range   Yeast Wet Prep HPF POC NONE SEEN NONE SEEN   Trich, Wet Prep NONE SEEN NONE SEEN   Clue Cells Wet Prep HPF POC PRESENT (A) NONE SEEN   WBC, Wet Prep HPF POC MODERATE (A) NONE SEEN   Sperm NONE SEEN   CBC     Status: Abnormal   Collection Time: 08/24/17  2:22 AM  Result Value Ref Range   WBC 10.1 4.0 - 10.5 K/uL   RBC 4.29 3.87 - 5.11 MIL/uL   Hemoglobin 11.7 (L) 12.0 - 15.0 g/dL   HCT 16.135.6 (L) 09.636.0 - 04.546.0 %   MCV 83.0 78.0 - 100.0 fL   MCH 27.3 26.0 - 34.0 pg   MCHC 32.9 30.0 - 36.0 g/dL   RDW 40.915.4 81.111.5 - 91.415.5 %   Platelets 300 150 - 400 K/uL   [place] Ob Comp Less 14 Wks  Result Date: 08/24/2017 CLINICAL DATA:  36 year old pregnant female with pelvic cramping and vaginal bleeding. EXAM: TWIN OBSTETRICAL ULTRASOUND <14 WKS COMPARISON:  None. FINDINGS: Number of IUPs:  2 Chorionicity/Amnionicity:  Dichorionic diamniotic. TWIN 1 Yolk sac:  Visualized Embryo:  Present Cardiac Activity: Detected Heart Rate: 158 bpm CRL:   13.4  mm   7 w 4 d                  US EDC: 04/08/2018 TWIN 2 Yolk sac:  Seen Embryo:  Present Cardiac Activity: Detected Heart Rate: 162 bpm CRL:   13.6  mm   7 w 4 d                  US EDC: 04/08/2018 Subchorionic hemorrhage:  None visualized. Maternal uterus/adnexae: The maternal ovaries are not visualized. IMPRESSION: Live dichorionic diamniotic twin pregnancy at an estimated gestational age of [redacted] weeks, 4 days. No acute findings. Electronically Signed   By: Elgie CollardArash  Radparvar M.D.   On: 08/24/2017 03:12   [place] Ob Comp Addl Gest Less 14 Wks  Result Date: 08/24/2017 CLINICAL DATA:  36 year old pregnant female with  pelvic cramping and vaginal bleeding. EXAM: TWIN OBSTETRICAL ULTRASOUND <14 WKS COMPARISON:  None. FINDINGS: Number of IUPs:  2 Chorionicity/Amnionicity:  Dichorionic diamniotic. TWIN 1 Yolk sac:  Visualized Embryo:  Present Cardiac Activity: Detected Heart Rate: 158 bpm CRL:   13.4  mm   7 w 4 d                  US EDC: 04/08/2018 TWIN 2 Yolk sac:  Seen Embryo:  Present Cardiac Activity: Detected Heart Rate: 162 bpm CRL:   13.6  mm   7 w 4 d                  US EDC: 04/08/2018 Subchorionic hemorrhage:  None visualized. Maternal uterus/adnexae: The maternal ovaries are not visualized. IMPRESSION: Live dichorionic diamniotic twin pregnancy at an estimated gestational age of [redacted] weeks, 4 days. No acute findings. Electronically Signed   By: Elgie CollardArash  Radparvar M.D.   On: 08/24/2017 03:12   MAU Course  Procedures  MDM   Assessment and Plan   1. Intrauterine pregnancy   2. First trimester bleeding   3. Cramping affecting pregnancy, antepartum   4. [redacted] weeks gestation of pregnancy   5. Dichorionic diamniotic twin pregnancy in first trimester    DC home Comfort measures reviewed  1st Trimester precautions  Bleeding precautions RX: none  Return to MAU as needed FU with OB as planned  Elevated blood pressure today. Hx of GHTN, likely chronic hypertension at this time. Consider antihypertensives when she is seen for her NOB visit if BP remains elevated.   Follow-up Information    [facility]   Specialty:  Obstetrics and Gynecology Contact information: [address] [place] [id number] [phone]          Thressa ShellerHeather Amaurie Schreckengost 08/24/2017, 2:25 AM

## 2017-08-24 NOTE — MAU Note (Signed)
Have had some brown bleeding tonight about 3 different times with some cramping. Had some spotting a week ago

## 2017-08-26 LAB — GC/CHLAMYDIA PROBE AMP (~~LOC~~) NOT AT ARMC
Chlamydia: NEGATIVE
NEISSERIA GONORRHEA: NEGATIVE

## 2017-09-06 ENCOUNTER — Other Ambulatory Visit (HOSPITAL_COMMUNITY)
Admission: RE | Admit: 2017-09-06 | Discharge: 2017-09-06 | Disposition: A | Discharge status: Home / Self Care | Payer: Medicaid Other | Source: Ambulatory Visit | Attending: Obstetrics & Gynecology | Admitting: Obstetrics & Gynecology

## 2017-09-06 ENCOUNTER — Ambulatory Visit (INDEPENDENT_AMBULATORY_CARE_PROVIDER_SITE_OTHER): Payer: Medicaid Other | Admitting: Obstetrics & Gynecology

## 2017-09-06 ENCOUNTER — Encounter: Payer: Self-pay | Admitting: Obstetrics & Gynecology

## 2017-09-06 DIAGNOSIS — O09899 Supervision of other high risk pregnancies, unspecified trimester: Secondary | ICD-10-CM | POA: Diagnosis not present

## 2017-09-06 DIAGNOSIS — Z23 Encounter for immunization: Secondary | ICD-10-CM | POA: Diagnosis not present

## 2017-09-06 DIAGNOSIS — Z3A00 Weeks of gestation of pregnancy not specified: Secondary | ICD-10-CM | POA: Diagnosis not present

## 2017-09-06 LAB — POCT URINALYSIS DIP (DEVICE)
Bilirubin Urine: NEGATIVE
Glucose, UA: NEGATIVE mg/dL
Hgb urine dipstick: NEGATIVE
Ketones, ur: NEGATIVE mg/dL
Leukocytes, UA: NEGATIVE
Nitrite: NEGATIVE
Protein, ur: NEGATIVE mg/dL
Specific Gravity, Urine: 1.025 (ref 1.005–1.030)
Urobilinogen, UA: 0.2 mg/dL (ref 0.0–1.0)
pH: 6.5 (ref 5.0–8.0)

## 2017-09-06 MED ORDER — PROMETHAZINE HCL 25 MG PO TABS: 25.0000 mg | ORAL_TABLET | Freq: Four times a day (QID) | ORAL | 2 refills | Status: DC | PRN

## 2017-09-06 MED ORDER — PRENATAL VITAMINS 0.8 MG PO TABS: 1.0000 | ORAL_TABLET | Freq: Every day | ORAL | 12 refills | Status: DC

## 2017-09-06 NOTE — Progress Notes (Signed)
Elevated PHQ-9 but refuses to see Behavioral Clinician.PMH form filled out by patient. She elected to reschedule NOB in order to see female provider

## 2017-09-07 LAB — GC/CHLAMYDIA PROBE AMP (~~LOC~~) NOT AT ARMC
CHLAMYDIA, DNA PROBE: NEGATIVE
Neisseria Gonorrhea: NEGATIVE

## 2017-09-07 LAB — OBSTETRIC PANEL, INCLUDING HIV
ANTIBODY SCREEN: NEGATIVE
BASOS: 0 %
Basophils Absolute: 0 10*3/uL (ref 0.0–0.2)
EOS (ABSOLUTE): 0.1 10*3/uL (ref 0.0–0.4)
EOS: 1 %
HEMATOCRIT: 36 % (ref 34.0–46.6)
HEMOGLOBIN: 12 g/dL (ref 11.1–15.9)
HIV Screen 4th Generation wRfx: NONREACTIVE
Hepatitis B Surface Ag: NEGATIVE
IMMATURE GRANS (ABS): 0 10*3/uL (ref 0.0–0.1)
Immature Granulocytes: 0 %
LYMPHS: 31 %
Lymphocytes Absolute: 2.5 10*3/uL (ref 0.7–3.1)
MCH: 26.8 pg (ref 26.6–33.0)
MCHC: 33.3 g/dL (ref 31.5–35.7)
MCV: 81 fL (ref 79–97)
MONOCYTES: 4 %
Monocytes Absolute: 0.4 10*3/uL (ref 0.1–0.9)
Neutrophils Absolute: 5.1 10*3/uL (ref 1.4–7.0)
Neutrophils: 64 %
Platelets: 335 10*3/uL (ref 150–379)
RBC: 4.47 x10E6/uL (ref 3.77–5.28)
RDW: 15.3 % (ref 12.3–15.4)
RH TYPE: POSITIVE
RPR Ser Ql: NONREACTIVE
Rubella Antibodies, IGG: 1.4 index (ref >0.99)
WBC: 8 10*3/uL (ref 3.4–10.8)

## 2017-09-07 LAB — HEMOGLOBIN A1C
Est. average glucose Bld gHb Est-mCnc: 114 mg/dL
Hgb A1c MFr Bld: 5.6 % (ref 4.8–5.6)

## 2017-09-10 ENCOUNTER — Encounter: Payer: Self-pay | Admitting: *Deleted

## 2017-09-11 LAB — CULTURE, OB URINE

## 2017-09-11 LAB — URINE CULTURE, OB REFLEX

## 2017-09-18 ENCOUNTER — Ambulatory Visit (INDEPENDENT_AMBULATORY_CARE_PROVIDER_SITE_OTHER): Payer: Medicaid Other

## 2017-09-18 VITALS — BP 135/86 | HR 93 | Wt 256.8 lb

## 2017-09-18 DIAGNOSIS — O10919 Unspecified pre-existing hypertension complicating pregnancy, unspecified trimester: Secondary | ICD-10-CM | POA: Insufficient documentation

## 2017-09-18 DIAGNOSIS — O30041 Twin pregnancy, dichorionic/diamniotic, first trimester: Secondary | ICD-10-CM

## 2017-09-18 DIAGNOSIS — O34219 Maternal care for unspecified type scar from previous cesarean delivery: Secondary | ICD-10-CM

## 2017-09-18 DIAGNOSIS — O10911 Unspecified pre-existing hypertension complicating pregnancy, first trimester: Secondary | ICD-10-CM

## 2017-09-18 DIAGNOSIS — O09891 Supervision of other high risk pregnancies, first trimester: Secondary | ICD-10-CM

## 2017-09-18 DIAGNOSIS — O30049 Twin pregnancy, dichorionic/diamniotic, unspecified trimester: Secondary | ICD-10-CM

## 2017-09-18 DIAGNOSIS — O09899 Supervision of other high risk pregnancies, unspecified trimester: Secondary | ICD-10-CM

## 2017-09-18 LAB — POCT URINALYSIS DIP (DEVICE)
BILIRUBIN URINE: NEGATIVE
Glucose, UA: NEGATIVE mg/dL
HGB URINE DIPSTICK: NEGATIVE
Ketones, ur: NEGATIVE mg/dL
LEUKOCYTES UA: NEGATIVE
Nitrite: NEGATIVE
Protein, ur: NEGATIVE mg/dL
SPECIFIC GRAVITY, URINE: 1.02 (ref 1.005–1.030)
Urobilinogen, UA: 1 mg/dL (ref 0.0–1.0)
pH: 7.5 (ref 5.0–8.0)

## 2017-09-18 MED ORDER — METOCLOPRAMIDE HCL 10 MG PO TABS: 10.0000 mg | ORAL_TABLET | Freq: Four times a day (QID) | ORAL | 2 refills | Status: DC

## 2017-09-18 MED ORDER — FAMOTIDINE 20 MG PO TABS: 20.0000 mg | ORAL_TABLET | Freq: Two times a day (BID) | ORAL | 2 refills | Status: DC

## 2017-09-18 NOTE — Patient Instructions (Signed)
Safe Medications in Pregnancy   Acne: Benzoyl Peroxide Salicylic Acid  Backache/Headache: Tylenol: 2 regular strength every 4 hours OR              2 Extra strength every 6 hours  Colds/Coughs/Allergies: Benadryl (alcohol free) 25 mg every 6 hours as needed Breath right strips Claritin Cepacol throat lozenges Chloraseptic throat spray Cold-Eeze- up to three times per day Cough drops, alcohol free Flonase (by prescription only) Guaifenesin Mucinex Robitussin DM (plain only, alcohol free) Saline nasal spray/drops Sudafed (pseudoephedrine) & Actifed ** use only after [redacted] weeks gestation and if you do not have high blood pressure Tylenol Vicks Vaporub Zinc lozenges Zyrtec   Constipation: Colace Ducolax suppositories Fleet enema Glycerin suppositories Metamucil Milk of magnesia Miralax Senokot Smooth move tea  Diarrhea: Kaopectate Imodium A-D  *NO pepto Bismol  Hemorrhoids: Anusol Anusol HC Preparation H Tucks  Indigestion: Tums Maalox Mylanta Zantac  Pepcid  Insomnia: Benadryl (alcohol free) 25mg every 6 hours as needed Tylenol PM Unisom, no Gelcaps  Leg Cramps: Tums MagGel  Nausea/Vomiting:  Bonine Dramamine Emetrol Ginger extract Sea bands Meclizine  Nausea medication to take during pregnancy:  Unisom (doxylamine succinate 25 mg tablets) Take one tablet daily at bedtime. If symptoms are not adequately controlled, the dose can be increased to a maximum recommended dose of two tablets daily (1/2 tablet in the morning, 1/2 tablet mid-afternoon and one at bedtime). Vitamin B6 100mg tablets. Take one tablet twice a day (up to 200 mg per day).  Skin Rashes: Aveeno products Benadryl cream or 25mg every 6 hours as needed Calamine Lotion 1% cortisone cream  Yeast infection: Gyne-lotrimin 7 Monistat 7   **If taking multiple medications, please check labels to avoid duplicating the same active ingredients **take medication as directed on  the label ** Do not exceed 4000 mg of tylenol in 24 hours **Do not take medications that contain aspirin or ibuprofen    AREA PEDIATRIC/FAMILY PRACTICE PHYSICIANS  [facility] [address], [address] [place], [place]  [address] Phone - [phone]   Fax - [phone]  [facility] [address] [place], [place] [address] Phone - [phone]   Fax - [phone]  [facility] [address] [place], [place]  [address] Phone - [phone]   Fax - [phone]  [facility] [address] [place], [place]  [address] Phone - [phone]   Fax - [phone]  [facility] [address] [place], [place]  [address] Phone - [phone]   Fax - [phone]  [facility] [address] [place], [place]  [address] Phone - [phone]   Fax - [phone]  [facility] [address] [place], [place]  [address] Phone - [phone]   Fax - [phone]  [facility] [address], [address] [place], [place]  [address] Phone - [phone]   Fax - [phone]  [facility] [address] [place], [place]  [address] Phone - [phone]   Fax - [phone] [facility] [address] [place], [place]  [address] Phone - [phone]   Fax - [phone]  [facility] [address] [place], [place]  [address] Phone - [phone]   Fax - [phone]  [facility] [address] , [place]  [address] Phone - [phone]   Fax -   [phone]  [facility] [address] [place], [place]  [address] Phone - [phone]   Fax - [phone]  [facility] GOSR[facility] [address] [place], [place]  [address] Phone - [phone]  [facility] [address] [place], [place]  [address] Phone - [phone]   Fax - [phone]  [facility] [address] [place], [place]  [address] Phone - [phone]   Fax - [phone]  [facility] [address] [place], [place]  [address] Phone - [phone]   Fax - [phone]  [facility] [address] [place], [place]  [address] Phone - [phone]   Fax - [phone]   [facility] [address] [place], [place]  [address] Phone - [phone]   Fax - [phone]  [facility] [address] [place], [place]  [address] Phone - [phone]   Fax - [phone]  [facility] R[facility] [address] [place], [place]  [address] Phone - [phone]   Fax - [phone]  [facility] [address] [place], [place]  [address] Phone - [phone]   Fax - [phone]  [facility] [address] [place], [place]  [address] Phone - [phone]   Fax - [phone] [facility] [address] [place], [place]  [address] Phone - [phone]   Fax - [phone]  [facility] [address] [place], [place]  [address] Phone - [phone]   Fax - [phone]   [facility] [address] [place], [place]  [address] Phone - [phone]   Fax - [phone]  [facility] Charlene Flemming MD [address] [place]  [address] Phone [phone]  Fax [phone]   

## 2017-09-18 NOTE — Progress Notes (Signed)
Subjective:   Colleen Terrell is a 37 y.o. [id number] at 9980w6d by early ultrasound being seen today for her first obstetrical visit.  Her obstetrical history is significant; has Twin gestation, dichorionic diamniotic; Advanced maternal age in multigravida; Supervision of other high risk pregnancies, unspecified trimester; Chronic hypertension during pregnancy, antepartum; and Previous cesarean delivery affecting pregnancy on their problem list.. Patient does intend to breast feed. Pregnancy history fully reviewed.  Patient reports nausea. States diclegis and phenergan are not working.   HISTORY: Obstetric History   G4   P3   T3   P0   A0   L4    SAB0   TAB0   Ectopic0   Multiple1   Live Births4     # Outcome Date GA Lbr Len/2nd Weight Sex Delivery Anes PTL Lv  4 Current           3A Term 09/13/14 7134w1d  7 lb 3 oz (3.26 kg) M CS-LTranv Spinal  LIV     Apgar1:  9                Apgar5: 9  3B Term 09/13/14 2334w1d  6 lb 7.7 oz (2.94 kg) F CS-LTranv Spinal  LIV     Apgar1:  8                Apgar5: 9  2 Term 02/02/12 9172w1d 03:10 / 00:23 9 lb 1.5 oz (4.125 kg) F Vag-Spont Local  LIV     Name: Jola BabinskiOUMAROU Terrell,GIRL Karol     Apgar1:  8                Apgar5: 8  1 Term 10/30/06 7338w0d 24:00 9 lb 3 oz (4.167 kg) M Vag-Spont EPI  LIV     Past Medical History:  Diagnosis Date  . GERD (gastroesophageal reflux disease)   . History of kidney infection during pregnancy   . Hypertension   . PP NVB 02/02/12 02/02/2012  . Unspecified hemorrhoids without mention of complication    Past Surgical History:  Procedure Laterality Date  . CESAREAN SECTION N/A 09/13/2014   Procedure: CESAREAN SECTION;  Surgeon: Levie HeritageJacob J Stinson, DO;  Location: [facility];  Service: Obstetrics;  Laterality: N/A;   Family History  Problem Relation Age of Onset  . Hypertension Father   . Hypertension Mother   . Anesthesia problems Neg Hx    Social History   Tobacco Use  . Smoking status: Never Smoker  . Smokeless  tobacco: Never Used  Substance Use Topics  . Alcohol use: No  . Drug use: No   No Known Allergies Current Outpatient Medications on File Prior to Visit  Medication Sig Dispense Refill  . calcium carbonate (TUMS - DOSED IN MG ELEMENTAL CALCIUM) 500 MG chewable tablet Chew 1 tablet by mouth daily.    . Prenatal Multivit-Min-Fe-FA (PRENATAL VITAMINS) 0.8 MG tablet Take 1 tablet by mouth daily. 30 tablet 12  . acetaminophen (TYLENOL) 500 MG tablet Take 2 tablets (1,000 mg total) by mouth every 6 (six) hours as needed for headache. (Patient not taking: Reported on 09/18/2017) 30 tablet 0  . doxylamine, Sleep, (UNISOM) 25 MG tablet Take 25 mg by mouth at bedtime as needed.    . fluticasone (FLONASE) 50 MCG/ACT nasal spray Place 1 spray into both nostrils daily. (Patient not taking: Reported on 09/06/2017) 16 g 2  . Multiple Vitamins-Calcium (ONE-A-DAY WOMENS FORMULA PO) Take 1 tablet by mouth.    . promethazine (  PHENERGAN) 25 MG tablet Take 1 tablet (25 mg total) by mouth every 6 (six) hours as needed for nausea or vomiting. (Patient not taking: Reported on 09/18/2017) 30 tablet 2  . pseudoephedrine (SUDAFED) 30 MG tablet Take 1 tablet (30 mg total) by mouth every 4 (four) hours as needed for congestion. (Patient not taking: Reported on 09/18/2017) 12 tablet 0  . pyridOXINE (VITAMIN B-6) 25 MG tablet Take 25 mg by mouth daily.     No current facility-administered medications on file prior to visit.      Exam   Vitals:   09/18/17 1045  BP: 135/86  Pulse: 93  Weight: 256 lb 13.4 oz (116.5 kg)      Uterus:     Pelvic Exam: Perineum: no hemorrhoids, normal perineum   Vulva: normal external genitalia, no lesions   Vagina:  normal mucosa, normal discharge   Cervix: no lesions and normal, pap smear done.    Adnexa: normal adnexa and no mass, fullness, tenderness   Bony Pelvis: average  System: General: well-developed, well-nourished female in no acute distress   Breast:  normal appearance, no  masses or tenderness   Skin: normal coloration and turgor, no rashes   Neurologic: oriented, normal, negative, normal mood   Extremities: normal strength, tone, and muscle mass, ROM of all joints is normal   HEENT PERRLA, extraocular movement intact and sclera clear, anicteric   Mouth/Teeth mucous membranes moist, pharynx normal without lesions and dental hygiene good   Neck supple and no masses   Cardiovascular: regular rate and rhythm   Respiratory:  no respiratory distress, normal breath sounds   Abdomen: soft, non-tender; bowel sounds normal; no masses,  no organomegaly     Assessment:   Pregnancy: [id number] Patient Active Problem List   Diagnosis Date Noted  . Chronic hypertension during pregnancy, antepartum 09/18/2017  . Previous cesarean delivery affecting pregnancy 09/18/2017  . Supervision of other high risk pregnancies, unspecified trimester 09/06/2017  . Advanced maternal age in multigravida   . Twin gestation, dichorionic diamniotic 03/18/2014     Plan:  1. Supervision of other high risk pregnancies, unspecified trimester - Last pap 2015- negative/negative HPV - RX for reglan and pepcid sent to patient's pharmacy  2. Dichorionic diamniotic twin pregnancy, antepartum - Unable to confirm two separate FHT, confirmed with bedside u/s by D. Day RN  3. Chronic hypertension during pregnancy, antepartum - Not on meds - History of gestational hypertension in previous pregnancies.   4. Previous cesarean delivery affecting pregnancy - Previous section with twins due to breech presentation. Desires TOLAC  Initial labs drawn. Continue prenatal vitamins. Genetic Screening discussed, First trimester screen, Quad screen and NIPS: declined Ultrasound discussed; fetal anatomic survey: requested. Problem list reviewed and updated. The nature of [facility] with multiple MDs and other Advanced Practice Providers was explained to patient; also  emphasized that residents, students are part of our team. Routine obstetric precautions reviewed. Return in about 4 weeks (around 10/16/2017).   Rolm Bookbinder, CNM 09/18/17 10:58 AM

## 2017-09-18 NOTE — Progress Notes (Signed)
FHT's obtained with bedside US today. FM observed from both babies.

## 2017-09-23 ENCOUNTER — Encounter: Payer: Self-pay | Admitting: Obstetrics and Gynecology

## 2017-10-15 ENCOUNTER — Ambulatory Visit (INDEPENDENT_AMBULATORY_CARE_PROVIDER_SITE_OTHER): Payer: Medicaid Other | Admitting: Obstetrics and Gynecology

## 2017-10-15 VITALS — BP 143/79 | HR 84 | Wt 253.5 lb

## 2017-10-15 DIAGNOSIS — O10919 Unspecified pre-existing hypertension complicating pregnancy, unspecified trimester: Secondary | ICD-10-CM

## 2017-10-15 DIAGNOSIS — O09522 Supervision of elderly multigravida, second trimester: Secondary | ICD-10-CM

## 2017-10-15 DIAGNOSIS — O34219 Maternal care for unspecified type scar from previous cesarean delivery: Secondary | ICD-10-CM

## 2017-10-15 DIAGNOSIS — O30042 Twin pregnancy, dichorionic/diamniotic, second trimester: Secondary | ICD-10-CM

## 2017-10-15 DIAGNOSIS — O09899 Supervision of other high risk pregnancies, unspecified trimester: Secondary | ICD-10-CM

## 2017-10-15 DIAGNOSIS — K219 Gastro-esophageal reflux disease without esophagitis: Secondary | ICD-10-CM

## 2017-10-15 MED ORDER — SUCRALFATE 1 G PO TABS: 1.0000 g | ORAL_TABLET | Freq: Three times a day (TID) | ORAL | 0 refills | Status: DC

## 2017-10-15 NOTE — Patient Instructions (Signed)
Heartburn During Pregnancy Heartburn is pain or discomfort in the throat or chest. It may cause a burning feeling. It happens when stomach acid moves up into the tube that carries food from your mouth to your stomach (esophagus). Heartburn is common during pregnancy. It usually goes away or gets better after giving birth. Follow these instructions at home: Eating and drinking  Do not drink alcohol while you are pregnant.  Figure out which foods and beverages make you feel worse, and avoid them.  Beverages that you may want to avoid include: ? Coffee and tea (with or without caffeine). ? Energy drinks and sports drinks. ? Bubbly (carbonated) drinks or sodas. ? Citrus fruit juices.  Foods that you may want to avoid include: ? Chocolate and cocoa. ? Peppermint and mint flavorings. ? Garlic, onions, and horseradish. ? Spicy and acidic foods. These include peppers, chili powder, curry powder, vinegar, hot sauces, and barbecue sauce. ? Citrus fruits, such as oranges, lemons, and limes. ? Tomato-based foods, such as red sauce, chili, and salsa. ? Fried and fatty foods, such as donuts, french fries, potato chips, and high-fat dressings. ? High-fat meats, such as hot dogs, cold cuts, sausage, ham, and bacon. ? High-fat dairy items, such as whole milk, butter, and cheese.  Eat small meals often, instead of large meals.  Avoid drinking a lot of liquid with your meals.  Avoid eating meals during the 2-3 hours before you go to bed.  Avoid lying down right after you eat.  Do not exercise right after you eat. Medicines  Take over-the-counter and prescription medicines only as told by your doctor.  Do not take aspirin, ibuprofen, or other NSAIDs unless your doctor tells you to do that.  Your doctor may tell you to avoid medicines that have sodium bicarbonate in them. General instructions  If told, raise the head of your bed about 6 inches (15 cm). You can do this by putting blocks under  the legs. Sleeping with more pillows does not help with heartburn.  Do not use any products that contain nicotine or tobacco, such as cigarettes and e-cigarettes. If you need help quitting, ask your doctor.  Wear loose-fitting clothing.  Try to lower your stress, such as with yoga or meditation. If you need help, ask your doctor.  Stay at a healthy weight. If you are overweight, work with your doctor to safely lose weight.  Keep all follow-up visits as told by your doctor. This is important. Contact a doctor if:  You get new symptoms.  Your symptoms do not get better with treatment.  You have weight loss and you do not know why.  You have trouble swallowing.  You make loud sounds when you breathe (wheeze).  You have a cough that does not go away.  You have heartburn often for more than 2 weeks.  You feel sick to your stomach (nauseous), and this does not get better with treatment.  You are throwing up (vomiting), and this does not get better with treatment.  You have pain in your belly (abdomen). Get help right away if:  You have very bad chest pain that spreads to your arm, neck, or jaw.  You feel sweaty, dizzy, or light-headed.  You have trouble breathing.  You have pain when swallowing.  You throw up and your throw-up looks like blood or coffee grounds.  Your poop (stool) is bloody or black. This information is not intended to replace advice given to you by your health care provider.   Make sure you discuss any questions you have with your health care provider. Document Released: 09/15/2010 Document Revised: 04/30/2016 Document Reviewed: 04/30/2016 Elsevier Interactive Patient Education  2017 [employer]  

## 2017-10-15 NOTE — Progress Notes (Signed)
Subjective:  Colleen Terrell is a 37 y.o. G4P3004 at 2476w5d being seen today for ongoing prenatal care.  She is currently monitored for the following issues for this high-risk pregnancy and has Twin gestation, dichorionic diamniotic; Advanced maternal age in multigravida; Supervision of other high risk pregnancies, unspecified trimester; Chronic hypertension during pregnancy, antepartum; and Previous cesarean delivery affecting pregnancy on their problem list.  Patient reports heartburn and vomiting. States that medications she was given last time are not working. She is no longer taking them. Having associated esophageal burning and left shoulder pain from throwing up so much.  Contractions: Not present. Vag. Bleeding: None.  Movement: Absent. Denies leaking of fluid.   The following portions of the patient's history were reviewed and updated as appropriate: allergies, current medications, past family history, past medical history, past social history, past surgical history and problem list. Problem list updated.  Objective:   Vitals:   10/15/17 0842  BP: (!) 143/79  Pulse: 84  Weight: 253 lb 8 oz (115 kg)    Fetal Status: Fetal Heart Rate (bpm): 155/153   Movement: Absent     General:  Alert, oriented and cooperative. Patient is in no acute distress.  Skin: Skin is warm and dry. No rash noted.   Cardiovascular: Normal heart rate noted  Respiratory: Normal respiratory effort, no problems with respiration noted  Abdomen: Soft, gravid, appropriate for gestational age. Pain/Pressure: Present     Pelvic: Vag. Bleeding: None     Cervical exam deferred        Extremities: Normal range of motion.  Edema: None  Mental Status: Normal mood and affect. Normal behavior. Normal judgment and thought content.   Urinalysis:      Assessment and Plan:  Pregnancy: [id number] at 5776w5d  1. Supervision of other high risk pregnancies, unspecified trimester Doing well. Continue routine care. Anatomy US  scheduled. Initial prenatal labs reviewed and wnl.  - US MFM OB DETAIL +14 WK; Future  2. Chronic hypertension during pregnancy, antepartum Not on medications. BP today slightly elevated. Will wait to start BP medications as BPs usually drop in second trimester. Reassess need for BP control at next visit. Patient encouraged to monitor BPs.  3. Dichorionic diamniotic twin pregnancy in second trimester Prior pregnancy was also twin gestation. Growth and anatomy US scheduled.   4. Previous cesarean delivery affecting pregnancy With last pregnancy had c-section due to breech presentation of first twin. Desires TOLAC. Will need to sign consent at next visit.   5. Elderly multigravida in second trimester Declined genetic testing.   6. Gastroesophageal reflux disease, esophagitis presence not specified Not well controlled. Changed patient to sucralfate to see if this help symptoms. Discussed dietary changes. Continue Reglan. To call if symptoms persist so that we can readjust her regimen as needed.   General obstetric precautions including but not limited to vaginal bleeding, contractions, leaking of fluid and fetal movement were reviewed in detail with the patient. Please refer to After Visit Summary for other counseling recommendations.  Return in about 4 weeks (around 11/12/2017) for HROB.   Pincus LargePhelps, Giomar Gusler Y, DO

## 2017-10-18 ENCOUNTER — Encounter: Payer: Medicaid Other | Admitting: Certified Nurse Midwife

## 2017-11-08 ENCOUNTER — Encounter (HOSPITAL_COMMUNITY): Payer: Self-pay | Admitting: Obstetrics and Gynecology

## 2017-11-14 ENCOUNTER — Encounter (HOSPITAL_COMMUNITY): Payer: Self-pay

## 2017-11-14 ENCOUNTER — Other Ambulatory Visit: Payer: Self-pay | Admitting: Obstetrics and Gynecology

## 2017-11-14 ENCOUNTER — Ambulatory Visit (HOSPITAL_COMMUNITY)
Admission: RE | Admit: 2017-11-14 | Discharge: 2017-11-14 | Disposition: A | Discharge status: Home / Self Care | Payer: Medicaid Other | Source: Ambulatory Visit | Attending: Obstetrics and Gynecology | Admitting: Obstetrics and Gynecology

## 2017-11-14 ENCOUNTER — Encounter: Payer: Self-pay | Admitting: Family Medicine

## 2017-11-14 ENCOUNTER — Ambulatory Visit (INDEPENDENT_AMBULATORY_CARE_PROVIDER_SITE_OTHER): Payer: Medicaid Other | Admitting: Family Medicine

## 2017-11-14 ENCOUNTER — Other Ambulatory Visit (HOSPITAL_COMMUNITY): Payer: Self-pay | Admitting: *Deleted

## 2017-11-14 VITALS — BP 141/76 | HR 89 | Wt 263.6 lb

## 2017-11-14 DIAGNOSIS — O09522 Supervision of elderly multigravida, second trimester: Secondary | ICD-10-CM

## 2017-11-14 DIAGNOSIS — Z3A19 19 weeks gestation of pregnancy: Secondary | ICD-10-CM | POA: Diagnosis not present

## 2017-11-14 DIAGNOSIS — O34219 Maternal care for unspecified type scar from previous cesarean delivery: Secondary | ICD-10-CM

## 2017-11-14 DIAGNOSIS — O30042 Twin pregnancy, dichorionic/diamniotic, second trimester: Secondary | ICD-10-CM | POA: Diagnosis not present

## 2017-11-14 DIAGNOSIS — Z3689 Encounter for other specified antenatal screening: Secondary | ICD-10-CM

## 2017-11-14 DIAGNOSIS — O10012 Pre-existing essential hypertension complicating pregnancy, second trimester: Secondary | ICD-10-CM | POA: Diagnosis not present

## 2017-11-14 DIAGNOSIS — O30049 Twin pregnancy, dichorionic/diamniotic, unspecified trimester: Secondary | ICD-10-CM

## 2017-11-14 DIAGNOSIS — O09899 Supervision of other high risk pregnancies, unspecified trimester: Secondary | ICD-10-CM

## 2017-11-14 DIAGNOSIS — O10919 Unspecified pre-existing hypertension complicating pregnancy, unspecified trimester: Secondary | ICD-10-CM

## 2017-11-14 DIAGNOSIS — O322XX2 Maternal care for transverse and oblique lie, fetus 2: Secondary | ICD-10-CM | POA: Diagnosis not present

## 2017-11-14 LAB — POCT URINALYSIS DIP (DEVICE)
BILIRUBIN URINE: NEGATIVE
Glucose, UA: NEGATIVE mg/dL
Hgb urine dipstick: NEGATIVE
Ketones, ur: 40 mg/dL — AB
Leukocytes, UA: NEGATIVE
Nitrite: NEGATIVE
PH: 6.5 (ref 5.0–8.0)
PROTEIN: NEGATIVE mg/dL
SPECIFIC GRAVITY, URINE: 1.02 (ref 1.005–1.030)
Urobilinogen, UA: 0.2 mg/dL (ref 0.0–1.0)

## 2017-11-14 MED ORDER — ASPIRIN 81 MG PO CHEW: 81.0000 mg | CHEWABLE_TABLET | Freq: Every day | ORAL | 3 refills | Status: DC

## 2017-11-14 NOTE — Progress Notes (Signed)
    PRENATAL VISIT NOTE  Subjective:  Colleen Terrell is a 37 y.o. G4P3004 at 6563w0d being seen today for ongoing prenatal care.  She is currently monitored for the following issues for this high-risk pregnancy and has Twin gestation, dichorionic diamniotic; Advanced maternal age in multigravida; Supervision of other high risk pregnancies, unspecified trimester; Chronic hypertension during pregnancy, antepartum; and Previous cesarean delivery affecting pregnancy on their problem list.  Patient reports no complaints.  Contractions: Not present. Vag. Bleeding: None.  Movement: Present. Denies leaking of fluid.   The following portions of the patient's history were reviewed and updated as appropriate: allergies, current medications, past family history, past medical history, past social history, past surgical history and problem list. Problem list updated.  Objective:   Vitals:   11/14/17 1154  BP: (!) 141/76  Pulse: 89  Weight: 263 lb 9.6 oz (119.6 kg)    Fetal Status:     Movement: Present     General:  Alert, oriented and cooperative. Patient is in no acute distress.  Skin: Skin is warm and dry. No rash noted.   Cardiovascular: Normal heart rate noted  Respiratory: Normal respiratory effort, no problems with respiration noted  Abdomen: Soft, gravid, appropriate for gestational age.  Pain/Pressure: Absent     Pelvic: Cervical exam deferred        Extremities: Normal range of motion.  Edema: None  Mental Status:  Normal mood and affect. Normal behavior. Normal judgment and thought content.   Assessment and Plan:  Pregnancy: G4P3004 at 2263w0d  1. Supervision of other high risk pregnancies, unspecified trimester Normal but limited anatomy x 2 today  2. Chronic hypertension during pregnancy, antepartum Add ASA--no medications - aspirin 81 MG chewable tablet; Chew 1 tablet (81 mg total) by mouth daily.  Dispense: 90 tablet; Refill: 3  3. Dichorionic diamniotic twin pregnancy  in second trimester Concordant growth, serial u/s for growth  4. Previous cesarean delivery affecting pregnancy Desires TOLAC--will need consent signed  5. Elderly multigravida in second trimester Declines genetic testing.  General obstetric precautions including but not limited to vaginal bleeding, contractions, leaking of fluid and fetal movement were reviewed in detail with the patient. Please refer to After Visit Summary for other counseling recommendations.  Return in 1 month (on 12/12/2017).   Reva Boresanya S Rebekka Lobello, MD

## 2017-11-14 NOTE — Progress Notes (Signed)
Patient did not keep appointment today. She will be called to reschedule.  

## 2017-11-14 NOTE — Progress Notes (Signed)
US for anatomy done today. Pt has sx of URI - sore throat, nasal congestion, cough. OTC meds discussed.

## 2017-11-14 NOTE — Patient Instructions (Signed)

## 2017-12-12 ENCOUNTER — Ambulatory Visit (HOSPITAL_COMMUNITY)
Admission: RE | Admit: 2017-12-12 | Discharge: 2017-12-12 | Disposition: A | Discharge status: Home / Self Care | Payer: Medicaid Other | Source: Ambulatory Visit | Attending: Obstetrics and Gynecology | Admitting: Obstetrics and Gynecology

## 2017-12-12 ENCOUNTER — Ambulatory Visit (INDEPENDENT_AMBULATORY_CARE_PROVIDER_SITE_OTHER): Payer: Medicaid Other | Admitting: Obstetrics & Gynecology

## 2017-12-12 ENCOUNTER — Encounter: Payer: Self-pay | Admitting: Obstetrics & Gynecology

## 2017-12-12 VITALS — BP 137/82 | HR 86 | Wt 266.7 lb

## 2017-12-12 DIAGNOSIS — O34219 Maternal care for unspecified type scar from previous cesarean delivery: Secondary | ICD-10-CM | POA: Insufficient documentation

## 2017-12-12 DIAGNOSIS — O09899 Supervision of other high risk pregnancies, unspecified trimester: Secondary | ICD-10-CM

## 2017-12-12 DIAGNOSIS — Z3A23 23 weeks gestation of pregnancy: Secondary | ICD-10-CM | POA: Diagnosis present

## 2017-12-12 DIAGNOSIS — O10012 Pre-existing essential hypertension complicating pregnancy, second trimester: Secondary | ICD-10-CM | POA: Insufficient documentation

## 2017-12-12 DIAGNOSIS — O09522 Supervision of elderly multigravida, second trimester: Secondary | ICD-10-CM | POA: Diagnosis present

## 2017-12-12 DIAGNOSIS — Z362 Encounter for other antenatal screening follow-up: Secondary | ICD-10-CM | POA: Insufficient documentation

## 2017-12-12 DIAGNOSIS — O30042 Twin pregnancy, dichorionic/diamniotic, second trimester: Secondary | ICD-10-CM | POA: Insufficient documentation

## 2017-12-12 DIAGNOSIS — O30049 Twin pregnancy, dichorionic/diamniotic, unspecified trimester: Secondary | ICD-10-CM

## 2017-12-12 MED ORDER — SUCRALFATE 1 G PO TABS: 1.0000 g | ORAL_TABLET | Freq: Three times a day (TID) | ORAL | 0 refills | Status: DC

## 2017-12-12 NOTE — Progress Notes (Signed)
   PRENATAL VISIT NOTE  Subjective:  Colleen Terrell is a 37 y.o. G4P3004 at 4161w0d being seen today for ongoing prenatal care.  She is currently monitored for the following issues for this high-risk pregnancy and has Twin gestation, dichorionic diamniotic; Advanced maternal age in multigravida; Supervision of other high risk pregnancies, unspecified trimester; Chronic hypertension during pregnancy, antepartum; and Previous cesarean delivery affecting pregnancy on their problem list.  Patient reports abdominal rash no itching.  Contractions: Not present. Vag. Bleeding: None.  Movement: Present. Denies leaking of fluid.   The following portions of the patient's history were reviewed and updated as appropriate: allergies, current medications, past family history, past medical history, past social history, past surgical history and problem list. Problem list updated.  Objective:   Vitals:   12/12/17 1127  BP: 137/82  Pulse: 86  Weight: 266 lb 11.2 oz (121 kg)    Fetal Status: Fetal Heart Rate (bpm): 145/150   Movement: Present     General:  Alert, oriented and cooperative. Patient is in no acute distress.  Skin: Skin is warm and dry. No rash noted.   Cardiovascular: Normal heart rate noted  Respiratory: Normal respiratory effort, no problems with respiration noted  Abdomen: Soft, gravid, appropriate for gestational age.  Pain/Pressure: Present     Pelvic: Cervical exam deferred        Extremities: Normal range of motion.  Edema: None  Mental Status: Normal mood and affect. Normal behavior. Normal judgment and thought content.  Benign appearing gestational rash upper abdomen Assessment and Plan:  Pregnancy: G4P3004 at 2061w0d  1. Supervision of other high risk pregnancies, unspecified trimester Prescribed for indigestion - sucralfate (CARAFATE) 1 g tablet; Take 1 tablet (1 g total) by mouth 3 (three) times daily with meals.  Dispense: 90 tablet; Refill: 0  Preterm labor symptoms  and general obstetric precautions including but not limited to vaginal bleeding, contractions, leaking of fluid and fetal movement were reviewed in detail with the patient. Please refer to After Visit Summary for other counseling recommendations.  Return in about 1 month (around 01/09/2018).  Future Appointments  Date Time Provider Department Center  01/09/2018  9:00 AM [facility] US 3 [facility]    Scheryl DarterJames Nazaiah Navarrete, MD

## 2017-12-12 NOTE — Patient Instructions (Signed)
Second Trimester of Pregnancy The second trimester is from week 13 through week 28, month 4 through 6. This is often the time in pregnancy that you feel your best. Often times, morning sickness has lessened or quit. You may have more energy, and you may get hungry more often. Your unborn baby (fetus) is growing rapidly. At the end of the sixth month, he or she is about 9 inches long and weighs about 1 pounds. You will likely feel the baby move (quickening) between 18 and 20 weeks of pregnancy. Follow these instructions at home:  Avoid all smoking, herbs, and alcohol. Avoid drugs not approved by your doctor.  Do not use any tobacco products, including cigarettes, chewing tobacco, and electronic cigarettes. If you need help quitting, ask your doctor. You may get counseling or other support to help you quit.  Only take medicine as told by your doctor. Some medicines are safe and some are not during pregnancy.  Exercise only as told by your doctor. Stop exercising if you start having cramps.  Eat regular, healthy meals.  Wear a good support bra if your breasts are tender.  Do not use hot tubs, steam rooms, or saunas.  Wear your seat belt when driving.  Avoid raw meat, uncooked cheese, and liter boxes and soil used by cats.  Take your prenatal vitamins.  Take 1500-2000 milligrams of calcium daily starting at the 20th week of pregnancy until you deliver your baby.  Try taking medicine that helps you poop (stool softener) as needed, and if your doctor approves. Eat more fiber by eating fresh fruit, vegetables, and whole grains. Drink enough fluids to keep your pee (urine) clear or pale yellow.  Take warm water baths (sitz baths) to soothe pain or discomfort caused by hemorrhoids. Use hemorrhoid cream if your doctor approves.  If you have puffy, bulging veins (varicose veins), wear support hose. Raise (elevate) your feet for 15 minutes, 3-4 times a day. Limit salt in your diet.  Avoid heavy  lifting, wear low heals, and sit up straight.  Rest with your legs raised if you have leg cramps or low back pain.  Visit your dentist if you have not gone during your pregnancy. Use a soft toothbrush to brush your teeth. Be gentle when you floss.  You can have sex (intercourse) unless your doctor tells you not to.  Go to your doctor visits. Get help if:  You feel dizzy.  You have mild cramps or pressure in your lower belly (abdomen).  You have a nagging pain in your belly area.  You continue to feel sick to your stomach (nauseous), throw up (vomit), or have watery poop (diarrhea).  You have bad smelling fluid coming from your vagina.  You have pain with peeing (urination). Get help right away if:  You have a fever.  You are leaking fluid from your vagina.  You have spotting or bleeding from your vagina.  You have severe belly cramping or pain.  You lose or gain weight rapidly.  You have trouble catching your breath and have chest pain.  You notice sudden or extreme puffiness (swelling) of your face, hands, ankles, feet, or legs.  You have not felt the baby move in over an hour.  You have severe headaches that do not go away with medicine.  You have vision changes. This information is not intended to replace advice given to you by your health care provider. Make sure you discuss any questions you have with your health care   provider. Document Released: 11/07/2009 Document Revised: 01/19/2016 Document Reviewed: 10/14/2012 Elsevier Interactive Patient Education  2017 [employer].  

## 2018-01-09 ENCOUNTER — Other Ambulatory Visit (HOSPITAL_COMMUNITY): Payer: Self-pay | Admitting: *Deleted

## 2018-01-09 ENCOUNTER — Ambulatory Visit (INDEPENDENT_AMBULATORY_CARE_PROVIDER_SITE_OTHER): Payer: Medicaid Other | Admitting: Family Medicine

## 2018-01-09 ENCOUNTER — Ambulatory Visit (HOSPITAL_COMMUNITY)
Admission: RE | Admit: 2018-01-09 | Discharge: 2018-01-09 | Disposition: A | Discharge status: Home / Self Care | Payer: Medicaid Other | Source: Ambulatory Visit | Attending: Family Medicine | Admitting: Family Medicine

## 2018-01-09 ENCOUNTER — Encounter (HOSPITAL_COMMUNITY): Payer: Self-pay

## 2018-01-09 VITALS — BP 149/94 | HR 92 | Wt 270.0 lb

## 2018-01-09 DIAGNOSIS — Z3A27 27 weeks gestation of pregnancy: Secondary | ICD-10-CM | POA: Insufficient documentation

## 2018-01-09 DIAGNOSIS — O30043 Twin pregnancy, dichorionic/diamniotic, third trimester: Secondary | ICD-10-CM

## 2018-01-09 DIAGNOSIS — O30042 Twin pregnancy, dichorionic/diamniotic, second trimester: Secondary | ICD-10-CM

## 2018-01-09 DIAGNOSIS — O34219 Maternal care for unspecified type scar from previous cesarean delivery: Secondary | ICD-10-CM

## 2018-01-09 DIAGNOSIS — O30049 Twin pregnancy, dichorionic/diamniotic, unspecified trimester: Secondary | ICD-10-CM | POA: Diagnosis present

## 2018-01-09 DIAGNOSIS — O09899 Supervision of other high risk pregnancies, unspecified trimester: Secondary | ICD-10-CM

## 2018-01-09 DIAGNOSIS — O10919 Unspecified pre-existing hypertension complicating pregnancy, unspecified trimester: Secondary | ICD-10-CM

## 2018-01-09 DIAGNOSIS — O09522 Supervision of elderly multigravida, second trimester: Secondary | ICD-10-CM

## 2018-01-09 DIAGNOSIS — B360 Pityriasis versicolor: Secondary | ICD-10-CM

## 2018-01-09 LAB — POCT URINALYSIS DIP (DEVICE)
Bilirubin Urine: NEGATIVE
Glucose, UA: NEGATIVE mg/dL
HGB URINE DIPSTICK: NEGATIVE
Ketones, ur: 40 mg/dL — AB
Leukocytes, UA: NEGATIVE
Nitrite: NEGATIVE
PH: 7 (ref 5.0–8.0)
PROTEIN: NEGATIVE mg/dL
SPECIFIC GRAVITY, URINE: 1.02 (ref 1.005–1.030)
UROBILINOGEN UA: 0.2 mg/dL (ref 0.0–1.0)

## 2018-01-09 MED ORDER — TERBINAFINE HCL 1 % EX CREA: 1.0000 "application " | TOPICAL_CREAM | Freq: Two times a day (BID) | CUTANEOUS | 3 refills | Status: DC

## 2018-01-09 NOTE — Progress Notes (Signed)
   PRENATAL VISIT NOTE  Subjective:  Colleen Terrell is a 37 y.o. G4P3004 at [redacted]w[redacted]d being seen today for ongoing prenatal care.  She is currently monitored for the following issues for this high-risk pregnancy and has Twin gestation, dichorionic diamniotic; Advanced maternal age in multigravida; Supervision of other high risk pregnancies, unspecified trimester; Chronic hypertension during pregnancy, antepartum; and Previous cesarean delivery affecting pregnancy on their problem list.  Patient reports rash on abdomen.  Contractions: Not present. Vag. Bleeding: None.  Movement: Present. Denies leaking of fluid.   The following portions of the patient's history were reviewed and updated as appropriate: allergies, current medications, past family history, past medical history, past social history, past surgical history and problem list. Problem list updated.  Objective:   Vitals:   01/09/18 1055 01/09/18 1101  BP: (!) 152/83 (!) 149/94  Pulse: 92   Weight: 270 lb (122.5 kg)     Fetal Status: Fetal Heart Rate (bpm): 145/155   Movement: Present     General:  Alert, oriented and cooperative. Patient is in no acute distress.  Skin: Skin is warm and dry. No rash noted.   Cardiovascular: Normal heart rate noted  Respiratory: Normal respiratory effort, no problems with respiration noted  Abdomen: Soft, gravid, appropriate for gestational age.  Pain/Pressure: Present     Pelvic: Cervical exam deferred        Extremities: Normal range of motion.  Edema: None  Mental Status: Normal mood and affect. Normal behavior. Normal judgment and thought content.   Assessment and Plan:  Pregnancy: G4P3004 at [redacted]w[redacted]d  1. Chronic hypertension during pregnancy, antepartum BP is trending up. Continue ASA.  2. Dichorionic diamniotic twin pregnancy in second trimester U/S today--per pt report, WNL  3. Multigravida of advanced maternal age in second trimester   4. Previous cesarean delivery affecting  pregnancy Desires TOLAC if [name] is vertex  5. Supervision of other high risk pregnancies, unspecified trimester Needs 28 wk labs next visit  6. Tinea versicolor Try [place] Blue shampoo and lamisil cream - terbinafine (LAMISIL) 1 % cream; Apply 1 application topically 2 (two) times daily.  Dispense: 42 g; Refill: 3  Preterm labor symptoms and general obstetric precautions including but not limited to vaginal bleeding, contractions, leaking of fluid and fetal movement were reviewed in detail with the patient. Please refer to After Visit Summary for other counseling recommendations.  Return in about 2 weeks (around 01/23/2018) for 28 wk labs.  Future Appointments  Date Time Provider Department Center  01/23/2018  9:30 AM [facility]OC  02/06/2018  9:00 AM [facility] [place] 3 [facility]  02/06/2018 10:35 AM Reva Bores, MD [facility]  03/06/2018 10:00 AM [facility] [place] 3 [facility]    Reva Bores, MD

## 2018-01-09 NOTE — Patient Instructions (Signed)

## 2018-01-23 ENCOUNTER — Other Ambulatory Visit: Payer: Medicaid Other

## 2018-01-23 DIAGNOSIS — O09899 Supervision of other high risk pregnancies, unspecified trimester: Secondary | ICD-10-CM

## 2018-01-24 LAB — RPR: RPR Ser Ql: NONREACTIVE

## 2018-01-24 LAB — CBC
Hematocrit: 31.6 % — ABNORMAL LOW (ref 34.0–46.6)
Hemoglobin: 10.6 g/dL — ABNORMAL LOW (ref 11.1–15.9)
MCH: 27.7 pg (ref 26.6–33.0)
MCHC: 33.5 g/dL (ref 31.5–35.7)
MCV: 83 fL (ref 79–97)
PLATELETS: 277 10*3/uL (ref 150–450)
RBC: 3.83 x10E6/uL (ref 3.77–5.28)
RDW: 14.6 % (ref 12.3–15.4)
WBC: 10 10*3/uL (ref 3.4–10.8)

## 2018-01-24 LAB — GLUCOSE TOLERANCE, 2 HOURS W/ 1HR
GLUCOSE, FASTING: 83 mg/dL (ref 65–91)
Glucose, 1 hour: 159 mg/dL (ref 65–179)
Glucose, 2 hour: 91 mg/dL (ref 65–152)

## 2018-01-24 LAB — HIV ANTIBODY (ROUTINE TESTING W REFLEX): HIV Screen 4th Generation wRfx: NONREACTIVE

## 2018-01-27 ENCOUNTER — Telehealth: Payer: Self-pay | Admitting: Family Medicine

## 2018-01-27 NOTE — Telephone Encounter (Signed)
Patient called to say she couldn't make her appointment for her BP check on 06/05. She stated she would just wait until the 13th because she will be coming for an appointment then.

## 2018-01-29 ENCOUNTER — Ambulatory Visit: Payer: Medicaid Other

## 2018-02-06 ENCOUNTER — Encounter (HOSPITAL_COMMUNITY): Payer: Self-pay

## 2018-02-06 ENCOUNTER — Ambulatory Visit (HOSPITAL_COMMUNITY)
Admission: RE | Admit: 2018-02-06 | Discharge: 2018-02-06 | Disposition: A | Discharge status: Home / Self Care | Payer: Medicaid Other | Source: Ambulatory Visit | Attending: Obstetrics & Gynecology | Admitting: Obstetrics & Gynecology

## 2018-02-06 ENCOUNTER — Other Ambulatory Visit: Payer: Self-pay

## 2018-02-06 ENCOUNTER — Ambulatory Visit (INDEPENDENT_AMBULATORY_CARE_PROVIDER_SITE_OTHER): Payer: Medicaid Other | Admitting: Family Medicine

## 2018-02-06 VITALS — BP 151/86 | HR 84 | Wt 277.0 lb

## 2018-02-06 DIAGNOSIS — O09523 Supervision of elderly multigravida, third trimester: Secondary | ICD-10-CM

## 2018-02-06 DIAGNOSIS — Z3A31 31 weeks gestation of pregnancy: Secondary | ICD-10-CM | POA: Insufficient documentation

## 2018-02-06 DIAGNOSIS — O30043 Twin pregnancy, dichorionic/diamniotic, third trimester: Secondary | ICD-10-CM | POA: Insufficient documentation

## 2018-02-06 DIAGNOSIS — O30042 Twin pregnancy, dichorionic/diamniotic, second trimester: Secondary | ICD-10-CM

## 2018-02-06 DIAGNOSIS — O34219 Maternal care for unspecified type scar from previous cesarean delivery: Secondary | ICD-10-CM

## 2018-02-06 DIAGNOSIS — O09899 Supervision of other high risk pregnancies, unspecified trimester: Secondary | ICD-10-CM

## 2018-02-06 DIAGNOSIS — O10919 Unspecified pre-existing hypertension complicating pregnancy, unspecified trimester: Secondary | ICD-10-CM

## 2018-02-06 DIAGNOSIS — O09893 Supervision of other high risk pregnancies, third trimester: Secondary | ICD-10-CM

## 2018-02-06 DIAGNOSIS — O10913 Unspecified pre-existing hypertension complicating pregnancy, third trimester: Secondary | ICD-10-CM

## 2018-02-06 NOTE — Patient Instructions (Addendum)
BENEFITS OF BREASTFEEDING Many women wonder if they should breastfeed. Research shows that breast milk contains the perfect balance of vitamins, protein and fat that your baby needs to grow. It also contains antibodies that help your baby's immune system to fight off viruses and bacteria and can reduce the risk of sudden infant death syndrome (SIDS). In addition, the colostrum (a fluid secreted from the breast in the first few days after delivery) helps your newborn's digestive system to grow and function well. Breast milk is easier to digest than formula. Also, if your baby is born preterm, breast milk can help to reduce both short- and long-term health problems. BENEFITS OF BREASTFEEDING FOR MOM . Breastfeeding causes a hormone to be released that helps the uterus to contract and return to its normal size more quickly. . It aids in postpartum weight loss, reduces risk of breast and ovarian cancer, heart disease and rheumatoid arthritis. . It decreases the amount of bleeding after the baby is born. benefits of breastfeeding for baby . Provides comfort and nutrition . Protects baby against - Obesity - Diabetes - Asthma - Childhood cancers - Heart disease - Ear infections - Diarrhea - Pneumonia - Stomach problems - Serious allergies - Skin rashes . Promotes growth and development . Reduces the risk of baby having Sudden Infant Death Syndrome (SIDS) only breastmilk for the first 6 months . Protects baby against diseases/allergies . It's the perfect amount for tiny bellies . It restores baby's energy . Provides the best nutrition for baby . Giving water or formula can make baby more likely to get sick, decrease Mom's milk supply, make baby less content with breastfeeding Skin to Skin After delivery, the staff will place your baby on your chest. This helps with the following: . Regulates baby's temperature, breathing, heart rate and blood sugar . Increases Mom's milk supply . Promotes  bonding . Keeps baby and Mom calm and decreases baby's crying Rooming In Your baby will stay in your room with you for the entire time you are in the hospital. This helps with the following: . Allows Mom to learn baby's feeding cues - Fluttering eyes - Sucking on tongue or hand - Rooting (opens mouth and turns head) - Nuzzling into the breast - Bringing hand to mouth . Allows breastfeeding on demand (when your baby is ready) . Helps baby to be calm and content . Ensures a good milk supply . Prevents complications with breastfeeding . Allows parents to learn to care for baby . Allows you to request assistance with breastfeeding Importance of a good latch . Increases milk transfer to baby - baby gets enough milk . Ensures you have enough milk for your baby . Decreases nipple soreness . Don't use pacifiers and bottles - these cause baby to suck differently than breastfeeding . Promotes continuation of breastfeeding Risks of Formula Supplementation with Breastfeeding Giving your infant formula in addition to your breast-milk EXCEPT when medically necessary can lead to: . Decreases your milk supply  . Loss of confidence in yourself for providing baby's nutrition  . Engorgement and possibly mastitis  . Asthma & allergies in the baby BREASTFEEDING FAQS How long should I breastfeed my baby? It is recommended that you provide your baby with breast milk only for the first 6 months and then continue for the first year and longer as desired. During the first few weeks after birth, your baby will need to feed 8-12 times every 24 hours, or every 2-3 hours. They will likely feed   for 15-30 minutes. How can I help my baby begin breastfeeding? Babies are born with an instinct to breastfeed. A healthy baby can begin breastfeeding right away without specific help. At the hospital, a nurse (or lactation consultant) will help you begin the process and will give you tips on good positioning. It may be  helpful to take a breastfeeding class before you deliver in order to know what to expect. How can I help my baby latch on? In order to assist your baby in latching-on, cup your breast in your hand and stroke your baby's lower lip with your nipple to stimulate your baby's rooting reflex. Your baby will look like he or she is yawning, at which point you should bring the baby towards your breast, while aiming the nipple at the roof of his or her mouth. Remember to bring the baby towards you and not your breast towards the baby. How can I tell if my baby is latched-on? Your baby will have all of your nipple and part of the dark area around the nipple in his or her mouth and your baby's nose will be touching your breast. You should see or hear the baby swallowing. If the baby is not latched-on properly, start the process over. To remove the suction, insert a clean finger between your breast and the baby's mouth. Should I switch breasts during feeding? After feeding on one side, switch the baby to your other breast. If he or she does not continue feeding - that is OK. Your baby will not necessarily need to feed from both breasts in a single feeding. On the next feeding, start with the other breast for efficiency and comfort. How can I tell if my baby is hungry? When your baby is hungry, they will nuzzle against your breast, make sucking noises and tongue motions and may put their hands near their mouth. Crying is a late sign of hunger, so you should not wait until this point. When they have received enough milk, they will unlatch from the breast. Is it okay to use a pacifier? Until your baby gets the hang of breastfeeding, experts recommend limiting pacifier usage. If you have questions about this, please contact your pediatrician. What can I do to ensure proper nutrition while breastfeeding? . Make sure that you support your own health and your baby's by eating a healthy, well-balanced diet . Your provider  may recommend that you continue to take your prenatal vitamin . Drink plenty of fluids. It is a good rule to drink one glass of water before or after feeding . Alcohol will remain in the breast milk for as long as it will remain in the blood stream. If you choose to have a drink, it is recommended that you wait at least 2 hours before feeding . Moderate amounts of caffeine are OK . Some over-the-counter or prescription medications are not recommended during breastfeeding. Check with your provider if you have questions What types of birth control methods are safe while breastfeeding? Progestin-only methods, including a daily pill, an IUD, the implant and the injection are safe while breastfeeding. Methods that contain estrogen (such as combination birth control pills, the vaginal ring and the patch) should not be used during the first month of breastfeeding as these can decrease your milk supply.  Preeclampsia and Eclampsia Preeclampsia is a serious condition that develops only during pregnancy. It is also called toxemia of pregnancy. This condition causes high blood pressure along with other symptoms, such as swelling and  headaches. These symptoms may develop as the condition gets worse. Preeclampsia may occur at 20 weeks of pregnancy or later. Diagnosing and treating preeclampsia early is very important. If not treated early, it can cause serious problems for you and your baby. One problem it can lead to is eclampsia, which is a condition that causes muscle jerking or shaking (convulsions or seizures) in the mother. Delivering your baby is the best treatment for preeclampsia or eclampsia. Preeclampsia and eclampsia symptoms usually go away after your baby is born. What are the causes? The cause of preeclampsia is not known. What increases the risk? The following risk factors make you more likely to develop preeclampsia:  Being pregnant for the first time.  Having had preeclampsia during a past  pregnancy.  Having a family history of preeclampsia.  Having high blood pressure.  Being pregnant with twins or triplets.  Being 3 or older.  Being African-American.  Having kidney disease or diabetes.  Having medical conditions such as lupus or blood diseases.  Being very overweight (obese).  What are the signs or symptoms? The earliest signs of preeclampsia are:  High blood pressure.  Increased protein in your urine. Your health care provider will check for this at every visit before you give birth (prenatal visit).  Other symptoms that may develop as the condition gets worse include:  Severe headaches.  Sudden weight gain.  Swelling of the hands, face, legs, and feet.  Nausea and vomiting.  Vision problems, such as blurred or double vision.  Numbness in the face, arms, legs, and feet.  Urinating less than usual.  Dizziness.  Slurred speech.  Abdominal pain, especially upper abdominal pain.  Convulsions or seizures.  Symptoms generally go away after giving birth. How is this diagnosed? There are no screening tests for preeclampsia. Your health care provider will ask you about symptoms and check for signs of preeclampsia during your prenatal visits. You may also have tests that include:  Urine tests.  Blood tests.  Checking your blood pressure.  Monitoring your baby's heart rate.  Ultrasound.  How is this treated? You and your health care provider will determine the treatment approach that is best for you. Treatment may include:  Having more frequent prenatal exams to check for signs of preeclampsia, if you have an increased risk for preeclampsia.  Bed rest.  Reducing how much salt (sodium) you eat.  Medicine to lower your blood pressure.  Staying in the hospital, if your condition is severe. There, treatment will focus on controlling your blood pressure and the amount of fluids in your body (fluid retention).  You may need to take  medicine (magnesium sulfate) to prevent seizures. This medicine may be given as an injection or through an IV tube.  Delivering your baby early, if your condition gets worse. You may have your labor started with medicine (induced), or you may have a cesarean delivery.  Follow these instructions at home: Eating and drinking   Drink enough fluid to keep your urine clear or pale yellow.  Eat a healthy diet that is low in sodium. Do not add salt to your food. Check nutrition labels to see how much sodium a food or beverage contains.  Avoid caffeine. Lifestyle  Do not use any products that contain nicotine or tobacco, such as cigarettes and e-cigarettes. If you need help quitting, ask your health care provider.  Do not use alcohol or drugs.  Avoid stress as much as possible. Rest and get plenty of sleep. General  instructions  Take over-the-counter and prescription medicines only as told by your health care provider.  When lying down, lie on your side. This keeps pressure off of your baby.  When sitting or lying down, raise (elevate) your feet. Try putting some pillows underneath your lower legs.  Exercise regularly. Ask your health care provider what kinds of exercise are best for you.  Keep all follow-up and prenatal visits as told by your health care provider. This is important. How is this prevented? To prevent preeclampsia or eclampsia from developing during another pregnancy:  Get proper medical care during pregnancy. Your health care provider may be able to prevent preeclampsia or diagnose and treat it early.  Your health care provider may have you take a low-dose aspirin or a calcium supplement during your next pregnancy.  You may have tests of your blood pressure and kidney function after giving birth.  Maintain a healthy weight. Ask your health care provider for help managing weight gain during pregnancy.  Work with your health care provider to manage any long-term  (chronic) health conditions you have, such as diabetes or kidney problems.  Contact a health care provider if:  You gain more weight than expected.  You have headaches.  You have nausea or vomiting.  You have abdominal pain.  You feel dizzy or light-headed. Get help right away if:  You develop sudden or severe swelling anywhere in your body. This usually happens in the legs.  You gain 5 lbs (2.3 kg) or more during one week.  You have severe: ? Abdominal pain. ? Headaches. ? Dizziness. ? Vision problems. ? Confusion. ? Nausea or vomiting.  You have a seizure.  You have trouble moving any part of your body.  You develop numbness in any part of your body.  You have trouble speaking.  You have any abnormal bleeding.  You pass out. This information is not intended to replace advice given to you by your health care provider. Make sure you discuss any questions you have with your health care provider. Document Released: 08/10/2000 Document Revised: 04/10/2016 Document Reviewed: 03/19/2016 Elsevier Interactive Patient Education  [employer].

## 2018-02-06 NOTE — Progress Notes (Signed)
   PRENATAL VISIT NOTE  Subjective:  Colleen Terrell is a 37 y.o. G4P3004 at 862w0d being seen today for ongoing prenatal care.  She is currently monitored for the following issues for this high-risk pregnancy and has Twin gestation, dichorionic diamniotic; Advanced maternal age in multigravida; Supervision of other high risk pregnancies, unspecified trimester; Chronic hypertension during pregnancy, antepartum; and Previous cesarean delivery affecting pregnancy on their problem list.  Patient reports epigastric pain.  Contractions: Not present. Vag. Bleeding: None.  Movement: Present. Denies leaking of fluid.   The following portions of the patient's history were reviewed and updated as appropriate: allergies, current medications, past family history, past medical history, past social history, past surgical history and problem list. Problem list updated.  Objective:   Vitals:   02/06/18 1147 02/06/18 1150  BP: (!) 153/86 (!) 151/86  Pulse: 84   Weight: 277 lb (125.6 kg)     Fetal Status:     Movement: Present     General:  Alert, oriented and cooperative. Patient is in no acute distress.  Skin: Skin is warm and dry. No rash noted.   Cardiovascular: Normal heart rate noted  Respiratory: Normal respiratory effort, no problems with respiration noted  Abdomen: Soft, gravid, appropriate for gestational age.  Pain/Pressure: Present     Pelvic: Cervical exam deferred        Extremities: Normal range of motion.  Edema: None  Mental Status: Normal mood and affect. Normal behavior. Normal judgment and thought content.   Assessment and Plan:  Pregnancy: G4P3004 at 752w0d  1. Supervision of other high risk pregnancies, unspecified trimester   2. Dichorionic diamniotic twin pregnancy in second trimester  Breech Vtx To try moxibustion, music and light to get [name] to turn Had growth this am - US MFM FETAL BPP WO NON STRESS; Future - US MFM FETAL BPP WO NST ADDL GESTATION;  Future  3. Chronic hypertension during pregnancy, antepartum BP is creeping up--check labs Warning signs reviewed Begin weekly testing. - CBC - Comprehensive metabolic panel - Protein, urine, 24 hour - US MFM FETAL BPP WO NON STRESS; Future - US MFM FETAL BPP WO NST ADDL GESTATION; Future  4. Multigravida of advanced maternal age in third trimester   5. Previous cesarean delivery affecting pregnancy Desires TOLAC, but if baby a is not vtx, will not be able to.  Preterm labor symptoms and general obstetric precautions including but not limited to vaginal bleeding, contractions, leaking of fluid and fetal movement were reviewed in detail with the patient. Please refer to After Visit Summary for other counseling recommendations.  Return in 1 week (on 02/13/2018) for needs MD, OB visit and NST x2.  Future Appointments  Date Time Provider Department Center  02/13/2018  9:45 AM [facility] US 2 [facility]  02/14/2018  8:15 AM [name], Michael L, MD [facility]  03/06/2018 10:00 AM [facility] US 3 [facility]    Reva Boresanya S Heliodoro Domagalski, MD

## 2018-02-07 LAB — CBC
HEMATOCRIT: 30.7 % — AB (ref 34.0–46.6)
Hemoglobin: 9.9 g/dL — ABNORMAL LOW (ref 11.1–15.9)
MCH: 26.4 pg — ABNORMAL LOW (ref 26.6–33.0)
MCHC: 32.2 g/dL (ref 31.5–35.7)
MCV: 82 fL (ref 79–97)
Platelets: 301 10*3/uL (ref 150–450)
RBC: 3.75 x10E6/uL — ABNORMAL LOW (ref 3.77–5.28)
RDW: 14.7 % (ref 12.3–15.4)
WBC: 8.6 10*3/uL (ref 3.4–10.8)

## 2018-02-07 LAB — COMPREHENSIVE METABOLIC PANEL
A/G RATIO: 1.2 (ref 1.2–2.2)
ALBUMIN: 3.2 g/dL — AB (ref 3.5–5.5)
ALK PHOS: 104 IU/L (ref 39–117)
ALT: 19 IU/L (ref 0–32)
AST: 27 IU/L (ref 0–40)
BILIRUBIN TOTAL: 0.2 mg/dL (ref 0.0–1.2)
BUN / CREAT RATIO: 12 (ref 9–23)
BUN: 7 mg/dL (ref 6–20)
CHLORIDE: 104 mmol/L (ref 96–106)
CO2: 20 mmol/L (ref 20–29)
Calcium: 8.3 mg/dL — ABNORMAL LOW (ref 8.7–10.2)
Creatinine, Ser: 0.6 mg/dL (ref 0.57–1.00)
GFR calc Af Amer: 134 mL/min/{1.73_m2} (ref >59)
GFR calc non Af Amer: 116 mL/min/{1.73_m2} (ref >59)
GLOBULIN, TOTAL: 2.7 g/dL (ref 1.5–4.5)
Glucose: 62 mg/dL — ABNORMAL LOW (ref 65–99)
POTASSIUM: 4.4 mmol/L (ref 3.5–5.2)
SODIUM: 136 mmol/L (ref 134–144)
Total Protein: 5.9 g/dL — ABNORMAL LOW (ref 6.0–8.5)

## 2018-02-09 ENCOUNTER — Other Ambulatory Visit: Payer: Self-pay | Admitting: Obstetrics & Gynecology

## 2018-02-09 DIAGNOSIS — O09899 Supervision of other high risk pregnancies, unspecified trimester: Secondary | ICD-10-CM

## 2018-02-10 ENCOUNTER — Ambulatory Visit: Payer: Medicaid Other

## 2018-02-10 VITALS — BP 151/96 | HR 105

## 2018-02-10 DIAGNOSIS — R030 Elevated blood-pressure reading, without diagnosis of hypertension: Secondary | ICD-10-CM

## 2018-02-10 NOTE — Progress Notes (Addendum)
Patient here today dropping off her 24 hour urine sample.  Blood pressure still elevated.  Left message informing patient to go to [facility] for evaluation for elevated blood pressure.  Patient agrees.

## 2018-02-11 ENCOUNTER — Encounter: Payer: Self-pay | Admitting: Family Medicine

## 2018-02-11 DIAGNOSIS — O119 Pre-existing hypertension with pre-eclampsia, unspecified trimester: Secondary | ICD-10-CM | POA: Insufficient documentation

## 2018-02-11 LAB — PROTEIN, URINE, 24 HOUR
Protein, 24H Urine: 702 mg/24 hr — ABNORMAL HIGH (ref 30–150)
Protein, Ur: 27 mg/dL

## 2018-02-13 ENCOUNTER — Other Ambulatory Visit: Payer: Self-pay | Admitting: Family Medicine

## 2018-02-13 ENCOUNTER — Ambulatory Visit (HOSPITAL_COMMUNITY)
Admission: RE | Admit: 2018-02-13 | Discharge: 2018-02-13 | Disposition: A | Discharge status: Home / Self Care | Payer: Medicaid Other | Source: Ambulatory Visit | Attending: Family Medicine | Admitting: Family Medicine

## 2018-02-13 ENCOUNTER — Encounter (HOSPITAL_COMMUNITY): Payer: Self-pay

## 2018-02-13 DIAGNOSIS — O321XX0 Maternal care for breech presentation, not applicable or unspecified: Secondary | ICD-10-CM | POA: Diagnosis not present

## 2018-02-13 DIAGNOSIS — O10913 Unspecified pre-existing hypertension complicating pregnancy, third trimester: Secondary | ICD-10-CM | POA: Diagnosis not present

## 2018-02-13 DIAGNOSIS — O30042 Twin pregnancy, dichorionic/diamniotic, second trimester: Secondary | ICD-10-CM

## 2018-02-13 DIAGNOSIS — Z3A32 32 weeks gestation of pregnancy: Secondary | ICD-10-CM

## 2018-02-13 DIAGNOSIS — O10919 Unspecified pre-existing hypertension complicating pregnancy, unspecified trimester: Secondary | ICD-10-CM

## 2018-02-13 DIAGNOSIS — O30043 Twin pregnancy, dichorionic/diamniotic, third trimester: Secondary | ICD-10-CM | POA: Diagnosis not present

## 2018-02-13 NOTE — Procedures (Signed)
Colleen StallMariama Oumarou Terrell 10-04-1980 3184w0d  Fetus A Non-Stress Test Interpretation for 02/13/18  Indication: Unsatisfactory BPP Colleen Terrell 10-04-1980 8684w0d   Fetus B Non-Stress Test Interpretation for 02/13/18  Indication: Unsatisfactory BPP  Fetal Heart Rate Fetus B Mode: External Baseline Rate (B): 130 BPM Variability: Moderate Accelerations: 10 x 10, 15 x 15 Decelerations: None  Uterine Activity Mode: Palpation, Toco Contraction Frequency (min): none  Interpretation (Baby B - Fetal Testing) Nonstress Test Interpretation (Baby B): Reactive Overall Impression (Baby B): Reassuring for gestational age Comments (Baby B): Reviewed with Dr. Sherrie Georgeecker    Fetal Heart Rate A Mode: External Baseline Rate (A): 140 bpm Variability: Moderate Accelerations: 10 x 10, 15 x 15 Decelerations: None Multiple birth?: Yes  Uterine Activity Mode: Palpation, Toco Contraction Frequency (min): none  Interpretation (Fetal Testing) Nonstress Test Interpretation: Reactive Overall Impression: Reassuring for gestational age Comments: Reviewed with Dr. Sherrie Georgeecker

## 2018-02-14 ENCOUNTER — Ambulatory Visit (INDEPENDENT_AMBULATORY_CARE_PROVIDER_SITE_OTHER): Payer: Medicaid Other | Admitting: Obstetrics and Gynecology

## 2018-02-14 ENCOUNTER — Encounter: Payer: Self-pay | Admitting: Obstetrics and Gynecology

## 2018-02-14 VITALS — BP 156/95 | HR 92 | Wt 278.8 lb

## 2018-02-14 DIAGNOSIS — O09899 Supervision of other high risk pregnancies, unspecified trimester: Secondary | ICD-10-CM

## 2018-02-14 DIAGNOSIS — O30043 Twin pregnancy, dichorionic/diamniotic, third trimester: Secondary | ICD-10-CM | POA: Diagnosis not present

## 2018-02-14 DIAGNOSIS — O09523 Supervision of elderly multigravida, third trimester: Secondary | ICD-10-CM

## 2018-02-14 DIAGNOSIS — O34219 Maternal care for unspecified type scar from previous cesarean delivery: Secondary | ICD-10-CM

## 2018-02-14 DIAGNOSIS — Z23 Encounter for immunization: Secondary | ICD-10-CM

## 2018-02-14 DIAGNOSIS — O119 Pre-existing hypertension with pre-eclampsia, unspecified trimester: Secondary | ICD-10-CM | POA: Diagnosis not present

## 2018-02-14 DIAGNOSIS — O09893 Supervision of other high risk pregnancies, third trimester: Secondary | ICD-10-CM

## 2018-02-14 LAB — POCT URINALYSIS DIP (DEVICE)
BILIRUBIN URINE: NEGATIVE
GLUCOSE, UA: NEGATIVE mg/dL
Hgb urine dipstick: NEGATIVE
KETONES UR: NEGATIVE mg/dL
NITRITE: NEGATIVE
Protein, ur: NEGATIVE mg/dL
Specific Gravity, Urine: 1.015 (ref 1.005–1.030)
Urobilinogen, UA: 1 mg/dL (ref 0.0–1.0)
pH: 7 (ref 5.0–8.0)

## 2018-02-14 MED ORDER — TETANUS-DIPHTH-ACELL PERTUSSIS 5-2.5-18.5 LF-MCG/0.5 IM SUSP
0.5000 mL | Freq: Once | INTRAMUSCULAR | Status: AC
  Administered 2018-02-14: 0.5 mL via INTRAMUSCULAR

## 2018-02-14 MED ORDER — LABETALOL HCL 200 MG PO TABS: 200.0000 mg | ORAL_TABLET | Freq: Two times a day (BID) | ORAL | 3 refills | Status: DC

## 2018-02-14 NOTE — Progress Notes (Signed)
Subjective:  Colleen Terrell is a 37 y.o. G4P3004 at 3660w1d being seen today for ongoing prenatal care.  She is currently monitored for the following issues for this high-risk pregnancy and has Twin gestation, dichorionic diamniotic; Advanced maternal age in multigravida; Supervision of other high risk pregnancies, unspecified trimester; Chronic hypertension during pregnancy, antepartum; Previous cesarean delivery affecting pregnancy; and Chronic hypertension with superimposed pre-eclampsia on their problem list.  Patient reports denies HA, visual changes or epigastric pain.  Contractions: Not present. Vag. Bleeding: None.  Movement: Present. Denies leaking of fluid.   The following portions of the patient's history were reviewed and updated as appropriate: allergies, current medications, past family history, past medical history, past social history, past surgical history and problem list. Problem list updated.  Objective:   Vitals:   02/14/18 0836 02/14/18 0840  BP: (!) 160/96 (!) 156/95  Pulse: 91 92  Weight: 278 lb 12.8 oz (126.5 kg)     Fetal Status: Fetal Heart Rate (bpm): 149/156   Movement: Present     General:  Alert, oriented and cooperative. Patient is in no acute distress.  Skin: Skin is warm and dry. No rash noted.   Cardiovascular: Normal heart rate noted  Respiratory: Normal respiratory effort, no problems with respiration noted  Abdomen: Soft, gravid, appropriate for gestational age. Pain/Pressure: Present     Pelvic:  Cervical exam deferred        Extremities: Normal range of motion.  Edema: Trace  Mental Status: Normal mood and affect. Normal behavior. Normal judgment and thought content.   Urinalysis:      Assessment and Plan:  Pregnancy: G4P3004 at 3960w1d  1. Supervision of other high risk pregnancies, unspecified trimester Stable  2. Dichorionic diamniotic twin pregnancy in third trimester Stable Growth scan yesterday, follow up in 3 weeks  3. Previous  cesarean delivery affecting pregnancy Desires TOLAC but knows if Tw A breech will need c section  4. Multigravida of advanced maternal age in third trimester Declined genetic testing  5. Chronic hypertension with superimposed pre-eclampsia BP has continued to slowly increase over the last week. No S/Sx of PEC Will start Labetalol 200 mg bid. Continue with BASA Weekly antenatal testing  Preterm labor symptoms and general obstetric precautions including but not limited to vaginal bleeding, contractions, leaking of fluid and fetal movement were reviewed in detail with the patient. Please refer to After Visit Summary for other counseling recommendations.  Return in about 1 week (around 02/21/2018) for OB visit.   [name], Aariah Godette L, MD

## 2018-02-14 NOTE — Patient Instructions (Signed)
Third Trimester of Pregnancy The third trimester is from week 28 through week 40 (months 7 through 9). The third trimester is a time when the unborn baby (fetus) is growing rapidly. At the end of the ninth month, the fetus is about 20 inches in length and weighs 6-10 pounds. Body changes during your third trimester Your body will continue to go through many changes during pregnancy. The changes vary from woman to woman. During the third trimester:  Your weight will continue to increase. You can expect to gain 25-35 pounds (11-16 kg) by the end of the pregnancy.  You may begin to get stretch marks on your hips, abdomen, and breasts.  You may urinate more often because the fetus is moving lower into your pelvis and pressing on your bladder.  You may develop or continue to have heartburn. This is caused by increased hormones that slow down muscles in the digestive tract.  You may develop or continue to have constipation because increased hormones slow digestion and cause the muscles that push waste through your intestines to relax.  You may develop hemorrhoids. These are swollen veins (varicose veins) in the rectum that can itch or be painful.  You may develop swollen, bulging veins (varicose veins) in your legs.  You may have increased body aches in the pelvis, back, or thighs. This is due to weight gain and increased hormones that are relaxing your joints.  You may have changes in your hair. These can include thickening of your hair, rapid growth, and changes in texture. Some women also have hair loss during or after pregnancy, or hair that feels dry or thin. Your hair will most likely return to normal after your baby is born.  Your breasts will continue to grow and they will continue to become tender. A yellow fluid (colostrum) may leak from your breasts. This is the first milk you are producing for your baby.  Your belly button may stick out.  You may notice more swelling in your hands,  face, or ankles.  You may have increased tingling or numbness in your hands, arms, and legs. The skin on your belly may also feel numb.  You may feel short of breath because of your expanding uterus.  You may have more problems sleeping. This can be caused by the size of your belly, increased need to urinate, and an increase in your body's metabolism.  You may notice the fetus "dropping," or moving lower in your abdomen (lightening).  You may have increased vaginal discharge.  You may notice your joints feel loose and you may have pain around your pelvic bone.  What to expect at prenatal visits You will have prenatal exams every 2 weeks until week 36. Then you will have weekly prenatal exams. During a routine prenatal visit:  You will be weighed to make sure you and the baby are growing normally.  Your blood pressure will be taken.  Your abdomen will be measured to track your baby's growth.  The fetal heartbeat will be listened to.  Any test results from the previous visit will be discussed.  You may have a cervical check near your due date to see if your cervix has softened or thinned (effaced).  You will be tested for Group B streptococcus. This happens between 35 and 37 weeks.  Your health care provider may ask you:  What your birth plan is.  How you are feeling.  If you are feeling the baby move.  If you have had   any abnormal symptoms, such as leaking fluid, bleeding, severe headaches, or abdominal cramping.  If you are using any tobacco products, including cigarettes, chewing tobacco, and electronic cigarettes.  If you have any questions.  Other tests or screenings that may be performed during your third trimester include:  Blood tests that check for low iron levels (anemia).  Fetal testing to check the health, activity level, and growth of the fetus. Testing is done if you have certain medical conditions or if there are problems during the  pregnancy.  Nonstress test (NST). This test checks the health of your baby to make sure there are no signs of problems, such as the baby not getting enough oxygen. During this test, a belt is placed around your belly. The baby is made to move, and its heart rate is monitored during movement.  What is false labor? False labor is a condition in which you feel small, irregular tightenings of the muscles in the womb (contractions) that usually go away with rest, changing position, or drinking water. These are called Braxton Hicks contractions. Contractions may last for hours, days, or even weeks before true labor sets in. If contractions come at regular intervals, become more frequent, increase in intensity, or become painful, you should see your health care provider. What are the signs of labor?  Abdominal cramps.  Regular contractions that start at 10 minutes apart and become stronger and more frequent with time.  Contractions that start on the top of the uterus and spread down to the lower abdomen and back.  Increased pelvic pressure and dull back pain.  A watery or bloody mucus discharge that comes from the vagina.  Leaking of amniotic fluid. This is also known as your "water breaking." It could be a slow trickle or a gush. Let your health care provider know if it has a color or strange odor. If you have any of these signs, call your health care provider right away, even if it is before your due date. Follow these instructions at home: Medicines  Follow your health care provider's instructions regarding medicine use. Specific medicines may be either safe or unsafe to take during pregnancy.  Take a prenatal vitamin that contains at least 600 micrograms (mcg) of folic acid.  If you develop constipation, try taking a stool softener if your health care provider approves. Eating and drinking  Eat a balanced diet that includes fresh fruits and vegetables, whole grains, good sources of protein  such as meat, eggs, or tofu, and low-fat dairy. Your health care provider will help you determine the amount of weight gain that is right for you.  Avoid raw meat and uncooked cheese. These carry germs that can cause birth defects in the baby.  If you have low calcium intake from food, talk to your health care provider about whether you should take a daily calcium supplement.  Eat four or five small meals rather than three large meals a day.  Limit foods that are high in fat and processed sugars, such as fried and sweet foods.  To prevent constipation: ? Drink enough fluid to keep your urine clear or pale yellow. ? Eat foods that are high in fiber, such as fresh fruits and vegetables, whole grains, and beans. Activity  Exercise only as directed by your health care provider. Most women can continue their usual exercise routine during pregnancy. Try to exercise for 30 minutes at least 5 days a week. Stop exercising if you experience uterine contractions.  Avoid heavy   lifting.  Do not exercise in extreme heat or humidity, or at high altitudes.  Wear low-heel, comfortable shoes.  Practice good posture.  You may continue to have sex unless your health care provider tells you otherwise. Relieving pain and discomfort  Take frequent breaks and rest with your legs elevated if you have leg cramps or low back pain.  Take warm sitz baths to soothe any pain or discomfort caused by hemorrhoids. Use hemorrhoid cream if your health care provider approves.  Wear a good support bra to prevent discomfort from breast tenderness.  If you develop varicose veins: ? Wear support pantyhose or compression stockings as told by your healthcare provider. ? Elevate your feet for 15 minutes, 3-4 times a day. Prenatal care  Write down your questions. Take them to your prenatal visits.  Keep all your prenatal visits as told by your health care provider. This is important. Safety  Wear your seat belt at  all times when driving.  Make a list of emergency phone numbers, including numbers for family, friends, the hospital, and police and fire departments. General instructions  Avoid cat litter boxes and soil used by cats. These carry germs that can cause birth defects in the baby. If you have a cat, ask someone to clean the litter box for you.  Do not travel far distances unless it is absolutely necessary and only with the approval of your health care provider.  Do not use hot tubs, steam rooms, or saunas.  Do not drink alcohol.  Do not use any products that contain nicotine or tobacco, such as cigarettes and e-cigarettes. If you need help quitting, ask your health care provider.  Do not use any medicinal herbs or unprescribed drugs. These chemicals affect the formation and growth of the baby.  Do not douche or use tampons or scented sanitary pads.  Do not cross your legs for long periods of time.  To prepare for the arrival of your baby: ? Take prenatal classes to understand, practice, and ask questions about labor and delivery. ? Make a trial run to the hospital. ? Visit the hospital and tour the maternity area. ? Arrange for maternity or paternity leave through employers. ? Arrange for family and friends to take care of pets while you are in the hospital. ? Purchase a rear-facing car seat and make sure you know how to install it in your car. ? Pack your hospital bag. ? Prepare the baby's nursery. Make sure to remove all pillows and stuffed animals from the baby's crib to prevent suffocation.  Visit your dentist if you have not gone during your pregnancy. Use a soft toothbrush to brush your teeth and be gentle when you floss. Contact a health care provider if:  You are unsure if you are in labor or if your water has broken.  You become dizzy.  You have mild pelvic cramps, pelvic pressure, or nagging pain in your abdominal area.  You have lower back pain.  You have persistent  nausea, vomiting, or diarrhea.  You have an unusual or bad smelling vaginal discharge.  You have pain when you urinate. Get help right away if:  Your water breaks before 37 weeks.  You have regular contractions less than 5 minutes apart before 37 weeks.  You have a fever.  You are leaking fluid from your vagina.  You have spotting or bleeding from your vagina.  You have severe abdominal pain or cramping.  You have rapid weight loss or weight gain.    You have shortness of breath with chest pain.  You notice sudden or extreme swelling of your face, hands, ankles, feet, or legs.  Your baby makes fewer than 10 movements in 2 hours.  You have severe headaches that do not go away when you take medicine.  You have vision changes. Summary  The third trimester is from week 28 through week 40, months 7 through 9. The third trimester is a time when the unborn baby (fetus) is growing rapidly.  During the third trimester, your discomfort may increase as you and your baby continue to gain weight. You may have abdominal, leg, and back pain, sleeping problems, and an increased need to urinate.  During the third trimester your breasts will keep growing and they will continue to become tender. A yellow fluid (colostrum) may leak from your breasts. This is the first milk you are producing for your baby.  False labor is a condition in which you feel small, irregular tightenings of the muscles in the womb (contractions) that eventually go away. These are called Braxton Hicks contractions. Contractions may last for hours, days, or even weeks before true labor sets in.  Signs of labor can include: abdominal cramps; regular contractions that start at 10 minutes apart and become stronger and more frequent with time; watery or bloody mucus discharge that comes from the vagina; increased pelvic pressure and dull back pain; and leaking of amniotic fluid. This information is not intended to replace advice  given to you by your health care provider. Make sure you discuss any questions you have with your health care provider. Document Released: 08/07/2001 Document Revised: 01/19/2016 Document Reviewed: 10/14/2012 Elsevier Interactive Patient Education  2017 [employer]  

## 2018-02-19 ENCOUNTER — Encounter: Payer: Self-pay | Admitting: Obstetrics and Gynecology

## 2018-02-19 ENCOUNTER — Encounter (HOSPITAL_COMMUNITY): Payer: Self-pay

## 2018-02-19 ENCOUNTER — Ambulatory Visit (INDEPENDENT_AMBULATORY_CARE_PROVIDER_SITE_OTHER): Payer: Medicaid Other | Admitting: Obstetrics and Gynecology

## 2018-02-19 ENCOUNTER — Other Ambulatory Visit: Payer: Medicaid Other

## 2018-02-19 VITALS — BP 143/84 | HR 98 | Wt 279.1 lb

## 2018-02-19 DIAGNOSIS — O10919 Unspecified pre-existing hypertension complicating pregnancy, unspecified trimester: Secondary | ICD-10-CM

## 2018-02-19 DIAGNOSIS — O1210 Gestational proteinuria, unspecified trimester: Secondary | ICD-10-CM | POA: Insufficient documentation

## 2018-02-19 DIAGNOSIS — O09523 Supervision of elderly multigravida, third trimester: Secondary | ICD-10-CM

## 2018-02-19 DIAGNOSIS — O30043 Twin pregnancy, dichorionic/diamniotic, third trimester: Secondary | ICD-10-CM

## 2018-02-19 DIAGNOSIS — O09893 Supervision of other high risk pregnancies, third trimester: Secondary | ICD-10-CM

## 2018-02-19 DIAGNOSIS — O119 Pre-existing hypertension with pre-eclampsia, unspecified trimester: Secondary | ICD-10-CM

## 2018-02-19 DIAGNOSIS — O34219 Maternal care for unspecified type scar from previous cesarean delivery: Secondary | ICD-10-CM

## 2018-02-19 DIAGNOSIS — O10913 Unspecified pre-existing hypertension complicating pregnancy, third trimester: Secondary | ICD-10-CM

## 2018-02-19 DIAGNOSIS — O09899 Supervision of other high risk pregnancies, unspecified trimester: Secondary | ICD-10-CM

## 2018-02-19 LAB — POCT URINALYSIS DIP (DEVICE)
BILIRUBIN URINE: NEGATIVE
Glucose, UA: NEGATIVE mg/dL
HGB URINE DIPSTICK: NEGATIVE
Ketones, ur: NEGATIVE mg/dL
LEUKOCYTES UA: NEGATIVE
Nitrite: NEGATIVE
PH: 8 (ref 5.0–8.0)
Protein, ur: NEGATIVE mg/dL
Specific Gravity, Urine: 1.02 (ref 1.005–1.030)
Urobilinogen, UA: 0.2 mg/dL (ref 0.0–1.0)

## 2018-02-19 NOTE — Progress Notes (Signed)
Prenatal Visit Note Date: 02/19/2018 Clinic: [facility]  Subjective:  Colleen Terrell is a 37 y.o. [id number] at 5333w6d being seen today for ongoing prenatal care.  She is currently monitored for the following issues for this high-risk pregnancy and has Twin gestation, dichorionic diamniotic; Advanced maternal age in multigravida; Supervision of other high risk pregnancies, unspecified trimester; Chronic hypertension during pregnancy, antepartum; Previous cesarean delivery affecting pregnancy; Chronic hypertension with superimposed pre-eclampsia; and Proteinuria in pregnancy, antepartum on their problem list.  Patient reports no complaints.   Contractions: Not present. Vag. Bleeding: None.  Movement: Present. Denies leaking of fluid.   The following portions of the patient's history were reviewed and updated as appropriate: allergies, current medications, past family history, past medical history, past social history, past surgical history and problem list. Problem list updated.  Objective:   Vitals:   02/19/18 1011 02/19/18 1014  BP: (!) 150/85 (!) 143/84  Pulse: 96 98  Weight: 279 lb 1.6 oz (126.6 kg)     Fetal Status: Fetal Heart Rate (bpm): 148/156   Movement: Present     General:  Alert, oriented and cooperative. Patient is in no acute distress.  Skin: Skin is warm and dry. No rash noted.   Cardiovascular: Normal heart rate noted  Respiratory: Normal respiratory effort, no problems with respiration noted  Abdomen: Soft, gravid, appropriate for gestational age. Pain/Pressure: Present     Pelvic:  Cervical exam deferred        Extremities: Normal range of motion.  Edema: Trace  Mental Status: Normal mood and affect. Normal behavior. Normal judgment and thought content.   Urinalysis:      Assessment and Plan:  Pregnancy: [id number] at 8033w6d  1. Chronic hypertension during pregnancy, antepartum See below.  - US MFM FETAL BPP W/NONSTRESS ADD'L GEST;  Future - US MFM FETAL BPP W/NONSTRESS; Future - US MFM FETAL BPP W/NONSTRESS; Future - US MFM FETAL BPP W/NONSTRESS ADD'L GEST; Future  2. Dichorionic diamniotic twin pregnancy in third trimester For delivery at 37wks. Request for c/s on 7/25 sent. vbac if A becomes cephalic.  - US MFM FETAL BPP W/NONSTRESS ADD'L GEST; Future - US MFM FETAL BPP W/NONSTRESS; Future - CBC - CMP and Liver - US MFM FETAL BPP W/NONSTRESS; Future - US MFM FETAL BPP W/NONSTRESS ADD'L GEST; Future  3. Supervision of other high risk pregnancies, unspecified trimester No BTL  4. Previous cesarean delivery affecting pregnancy  5. Multigravida of advanced maternal age in third trimester  6. Chronic hypertension with superimposed pre-eclampsia Surveillance labs today. Hard to say if mild pre-eclampsia or not since no baseline urine available for this pregnancy. She did have a 08/2014 negative PC ratio. Pt started on labetalol last week. Continue labetalol 200/200. For delivery at 37wks. Nst/bpp due tomorrow.  - CBC - CMP and Liver  Preterm labor symptoms and general obstetric precautions including but not limited to vaginal bleeding, contractions, leaking of fluid and fetal movement were reviewed in detail with the patient. Please refer to After Visit Summary for other counseling recommendations.  Return in about 1 week (around 02/26/2018) for on same day as u/s next week. hrob. [place].   Taima Rada, MD

## 2018-02-19 NOTE — Progress Notes (Signed)
NST with BPP scheduled with MFM 02/20/18 @ 0900.  Pt notified.

## 2018-02-20 ENCOUNTER — Encounter (HOSPITAL_COMMUNITY): Payer: Self-pay

## 2018-02-20 ENCOUNTER — Ambulatory Visit (HOSPITAL_COMMUNITY)
Admission: RE | Admit: 2018-02-20 | Discharge: 2018-02-20 | Disposition: A | Discharge status: Home / Self Care | Payer: Medicaid Other | Source: Ambulatory Visit | Attending: Obstetrics and Gynecology | Admitting: Obstetrics and Gynecology

## 2018-02-20 DIAGNOSIS — O09523 Supervision of elderly multigravida, third trimester: Secondary | ICD-10-CM | POA: Diagnosis not present

## 2018-02-20 DIAGNOSIS — O10013 Pre-existing essential hypertension complicating pregnancy, third trimester: Secondary | ICD-10-CM | POA: Insufficient documentation

## 2018-02-20 DIAGNOSIS — O34219 Maternal care for unspecified type scar from previous cesarean delivery: Secondary | ICD-10-CM | POA: Diagnosis not present

## 2018-02-20 DIAGNOSIS — Z3A33 33 weeks gestation of pregnancy: Secondary | ICD-10-CM | POA: Insufficient documentation

## 2018-02-20 DIAGNOSIS — O30043 Twin pregnancy, dichorionic/diamniotic, third trimester: Secondary | ICD-10-CM | POA: Diagnosis not present

## 2018-02-20 DIAGNOSIS — O10919 Unspecified pre-existing hypertension complicating pregnancy, unspecified trimester: Secondary | ICD-10-CM

## 2018-02-20 LAB — CMP AND LIVER
ALK PHOS: 119 IU/L — AB (ref 39–117)
ALT: 16 IU/L (ref 0–32)
AST: 27 IU/L (ref 0–40)
Albumin: 3.1 g/dL — ABNORMAL LOW (ref 3.5–5.5)
BUN: 5 mg/dL — AB (ref 6–20)
Bilirubin Total: <0.2 mg/dL (ref 0.0–1.2)
Bilirubin, Direct: 0.1 mg/dL (ref 0.00–0.40)
CO2: 21 mmol/L (ref 20–29)
Calcium: 8.3 mg/dL — ABNORMAL LOW (ref 8.7–10.2)
Chloride: 103 mmol/L (ref 96–106)
Creatinine, Ser: 0.57 mg/dL (ref 0.57–1.00)
GFR calc Af Amer: 136 mL/min/{1.73_m2} (ref >59)
GFR calc non Af Amer: 118 mL/min/{1.73_m2} (ref >59)
Glucose: 83 mg/dL (ref 65–99)
Potassium: 4.4 mmol/L (ref 3.5–5.2)
SODIUM: 139 mmol/L (ref 134–144)
TOTAL PROTEIN: 5.7 g/dL — AB (ref 6.0–8.5)

## 2018-02-20 LAB — CBC
Hematocrit: 31.4 % — ABNORMAL LOW (ref 34.0–46.6)
Hemoglobin: 9.7 g/dL — ABNORMAL LOW (ref 11.1–15.9)
MCH: 25.6 pg — AB (ref 26.6–33.0)
MCHC: 30.9 g/dL — AB (ref 31.5–35.7)
MCV: 83 fL (ref 79–97)
Platelets: 271 10*3/uL (ref 150–450)
RBC: 3.79 x10E6/uL (ref 3.77–5.28)
RDW: 13.6 % (ref 12.3–15.4)
WBC: 8.6 10*3/uL (ref 3.4–10.8)

## 2018-02-26 ENCOUNTER — Ambulatory Visit (INDEPENDENT_AMBULATORY_CARE_PROVIDER_SITE_OTHER): Payer: Medicaid Other | Admitting: Obstetrics and Gynecology

## 2018-02-26 ENCOUNTER — Other Ambulatory Visit: Payer: Self-pay | Admitting: Obstetrics and Gynecology

## 2018-02-26 ENCOUNTER — Encounter: Payer: Self-pay | Admitting: Obstetrics and Gynecology

## 2018-02-26 ENCOUNTER — Ambulatory Visit (HOSPITAL_COMMUNITY)
Admission: RE | Admit: 2018-02-26 | Discharge: 2018-02-26 | Disposition: A | Discharge status: Home / Self Care | Payer: Medicaid Other | Source: Ambulatory Visit | Attending: Obstetrics and Gynecology | Admitting: Obstetrics and Gynecology

## 2018-02-26 VITALS — BP 146/86 | HR 83 | Wt 278.9 lb

## 2018-02-26 DIAGNOSIS — O10013 Pre-existing essential hypertension complicating pregnancy, third trimester: Secondary | ICD-10-CM | POA: Diagnosis not present

## 2018-02-26 DIAGNOSIS — O30043 Twin pregnancy, dichorionic/diamniotic, third trimester: Secondary | ICD-10-CM | POA: Diagnosis present

## 2018-02-26 DIAGNOSIS — O358XX2 Maternal care for other (suspected) fetal abnormality and damage, fetus 2: Secondary | ICD-10-CM

## 2018-02-26 DIAGNOSIS — O34219 Maternal care for unspecified type scar from previous cesarean delivery: Secondary | ICD-10-CM | POA: Diagnosis not present

## 2018-02-26 DIAGNOSIS — O09899 Supervision of other high risk pregnancies, unspecified trimester: Secondary | ICD-10-CM

## 2018-02-26 DIAGNOSIS — Z3A33 33 weeks gestation of pregnancy: Secondary | ICD-10-CM

## 2018-02-26 DIAGNOSIS — O09523 Supervision of elderly multigravida, third trimester: Secondary | ICD-10-CM | POA: Diagnosis not present

## 2018-02-26 DIAGNOSIS — O10919 Unspecified pre-existing hypertension complicating pregnancy, unspecified trimester: Secondary | ICD-10-CM

## 2018-02-26 DIAGNOSIS — O321XX1 Maternal care for breech presentation, fetus 1: Secondary | ICD-10-CM | POA: Diagnosis not present

## 2018-02-26 DIAGNOSIS — Z98891 History of uterine scar from previous surgery: Secondary | ICD-10-CM

## 2018-02-26 DIAGNOSIS — O119 Pre-existing hypertension with pre-eclampsia, unspecified trimester: Secondary | ICD-10-CM

## 2018-02-26 NOTE — Progress Notes (Signed)
   PRENATAL VISIT NOTE  Subjective:  Colleen Terrell is a 37 y.o. G4P3004 at 4062w6d being seen today for ongoing prenatal care.  She is currently monitored for the following issues for this high-risk pregnancy and has Twin gestation, dichorionic diamniotic; Advanced maternal age in multigravida; Supervision of other high risk pregnancies, unspecified trimester; Chronic hypertension during pregnancy, antepartum; Previous cesarean delivery affecting pregnancy; Chronic hypertension with superimposed pre-eclampsia; and Proteinuria in pregnancy, antepartum on their problem list.  Patient reports no complaints.  Contractions: Not present. Vag. Bleeding: None.  Movement: Present. Denies leaking of fluid.   The following portions of the patient's history were reviewed and updated as appropriate: allergies, current medications, past family history, past medical history, past social history, past surgical history and problem list. Problem list updated.  Objective:   Vitals:   02/26/18 1526  BP: (!) 146/86  Pulse: 83  Weight: 278 lb 14.4 oz (126.5 kg)    Fetal Status: Fetal Heart Rate (bpm): BPP   Movement: Present     General:  Alert, oriented and cooperative. Patient is in no acute distress.  Skin: Skin is warm and dry. No rash noted.   Cardiovascular: Normal heart rate noted  Respiratory: Normal respiratory effort, no problems with respiration noted  Abdomen: Soft, gravid, appropriate for gestational age.  Pain/Pressure: Absent     Pelvic: Cervical exam deferred        Extremities: Normal range of motion.  Edema: Trace  Mental Status: Normal mood and affect. Normal behavior. Normal judgment and thought content.   Assessment and Plan:  Pregnancy: [id number] at 6662w6d  1. Supervision of other high risk pregnancies, unspecified trimester Patient is doing well without complaints  2. Chronic hypertension with superimposed pre-eclampsia BP stable on labetolol 200 BID Continue ASA BPP 8/8  times 2 breech/vertex Continue antenatal testing  3. Multigravida of advanced maternal age in third trimester   4. Dichorionic diamniotic twin pregnancy in third trimester   Preterm labor symptoms and general obstetric precautions including but not limited to vaginal bleeding, contractions, leaking of fluid and fetal movement were reviewed in detail with the patient. Please refer to After Visit Summary for other counseling recommendations.  Return in about 1 week (around 03/05/2018) for ROB, NST.  Future Appointments  Date Time Provider Department Center  03/06/2018 10:00 AM [facility] US 3 [facility]  03/06/2018 11:15 AM Anyanwu, Jethro BastosUgonna A, [facility]    Catalina AntiguaPeggy Japheth Diekman, MD

## 2018-02-28 ENCOUNTER — Encounter: Payer: Self-pay | Admitting: Obstetrics and Gynecology

## 2018-02-28 DIAGNOSIS — O358XX2 Maternal care for other (suspected) fetal abnormality and damage, fetus 2: Secondary | ICD-10-CM | POA: Insufficient documentation

## 2018-03-05 ENCOUNTER — Telehealth (HOSPITAL_COMMUNITY): Payer: Self-pay | Admitting: *Deleted

## 2018-03-05 NOTE — Telephone Encounter (Signed)
Preadmission screen  

## 2018-03-06 ENCOUNTER — Encounter (HOSPITAL_COMMUNITY): Payer: Self-pay

## 2018-03-06 ENCOUNTER — Other Ambulatory Visit (HOSPITAL_COMMUNITY): Payer: Self-pay | Admitting: Maternal and Fetal Medicine

## 2018-03-06 ENCOUNTER — Other Ambulatory Visit: Payer: Self-pay | Admitting: Obstetrics and Gynecology

## 2018-03-06 ENCOUNTER — Encounter: Payer: Medicaid Other | Admitting: Obstetrics & Gynecology

## 2018-03-06 ENCOUNTER — Ambulatory Visit (HOSPITAL_COMMUNITY)
Admission: RE | Admit: 2018-03-06 | Discharge: 2018-03-06 | Disposition: A | Discharge status: Home / Self Care | Payer: Medicaid Other | Source: Ambulatory Visit | Attending: Obstetrics and Gynecology | Admitting: Obstetrics and Gynecology

## 2018-03-06 DIAGNOSIS — O119 Pre-existing hypertension with pre-eclampsia, unspecified trimester: Secondary | ICD-10-CM

## 2018-03-06 DIAGNOSIS — Z3A35 35 weeks gestation of pregnancy: Secondary | ICD-10-CM

## 2018-03-06 DIAGNOSIS — I10 Essential (primary) hypertension: Secondary | ICD-10-CM

## 2018-03-06 DIAGNOSIS — O34219 Maternal care for unspecified type scar from previous cesarean delivery: Secondary | ICD-10-CM | POA: Diagnosis not present

## 2018-03-06 DIAGNOSIS — O30043 Twin pregnancy, dichorionic/diamniotic, third trimester: Secondary | ICD-10-CM

## 2018-03-06 DIAGNOSIS — O358XX2 Maternal care for other (suspected) fetal abnormality and damage, fetus 2: Secondary | ICD-10-CM | POA: Insufficient documentation

## 2018-03-06 DIAGNOSIS — O10013 Pre-existing essential hypertension complicating pregnancy, third trimester: Secondary | ICD-10-CM | POA: Diagnosis not present

## 2018-03-06 DIAGNOSIS — O09523 Supervision of elderly multigravida, third trimester: Secondary | ICD-10-CM

## 2018-03-06 DIAGNOSIS — O113 Pre-existing hypertension with pre-eclampsia, third trimester: Secondary | ICD-10-CM

## 2018-03-06 DIAGNOSIS — Z79899 Other long term (current) drug therapy: Secondary | ICD-10-CM | POA: Insufficient documentation

## 2018-03-07 ENCOUNTER — Telehealth (HOSPITAL_COMMUNITY): Payer: Self-pay | Admitting: *Deleted

## 2018-03-07 NOTE — Telephone Encounter (Signed)
Preadmission screen  

## 2018-03-10 ENCOUNTER — Telehealth (HOSPITAL_COMMUNITY): Payer: Self-pay | Admitting: *Deleted

## 2018-03-10 NOTE — Telephone Encounter (Signed)
Preadmission screen  

## 2018-03-11 ENCOUNTER — Telehealth (HOSPITAL_COMMUNITY): Payer: Self-pay | Admitting: *Deleted

## 2018-03-11 NOTE — Telephone Encounter (Signed)
Preadmission screen  

## 2018-03-12 ENCOUNTER — Encounter (HOSPITAL_COMMUNITY): Payer: Self-pay

## 2018-03-13 ENCOUNTER — Ambulatory Visit (INDEPENDENT_AMBULATORY_CARE_PROVIDER_SITE_OTHER): Payer: Medicaid Other | Admitting: Obstetrics and Gynecology

## 2018-03-13 ENCOUNTER — Other Ambulatory Visit: Payer: Self-pay | Admitting: Obstetrics and Gynecology

## 2018-03-13 ENCOUNTER — Other Ambulatory Visit (HOSPITAL_COMMUNITY)
Admission: RE | Admit: 2018-03-13 | Discharge: 2018-03-13 | Disposition: A | Discharge status: Home / Self Care | Payer: Medicaid Other | Source: Ambulatory Visit | Attending: Obstetrics and Gynecology | Admitting: Obstetrics and Gynecology

## 2018-03-13 ENCOUNTER — Other Ambulatory Visit (HOSPITAL_COMMUNITY): Payer: Self-pay | Admitting: *Deleted

## 2018-03-13 ENCOUNTER — Ambulatory Visit (INDEPENDENT_AMBULATORY_CARE_PROVIDER_SITE_OTHER): Payer: Medicaid Other | Admitting: *Deleted

## 2018-03-13 ENCOUNTER — Encounter: Payer: Self-pay | Admitting: Obstetrics and Gynecology

## 2018-03-13 ENCOUNTER — Encounter (HOSPITAL_COMMUNITY): Payer: Self-pay

## 2018-03-13 ENCOUNTER — Ambulatory Visit (HOSPITAL_COMMUNITY)
Admission: RE | Admit: 2018-03-13 | Discharge: 2018-03-13 | Disposition: A | Discharge status: Home / Self Care | Payer: Medicaid Other | Source: Ambulatory Visit | Attending: Obstetrics and Gynecology | Admitting: Obstetrics and Gynecology

## 2018-03-13 VITALS — BP 149/91 | HR 79 | Wt 283.5 lb

## 2018-03-13 DIAGNOSIS — O30043 Twin pregnancy, dichorionic/diamniotic, third trimester: Secondary | ICD-10-CM

## 2018-03-13 DIAGNOSIS — O34219 Maternal care for unspecified type scar from previous cesarean delivery: Secondary | ICD-10-CM

## 2018-03-13 DIAGNOSIS — Z3A36 36 weeks gestation of pregnancy: Secondary | ICD-10-CM | POA: Insufficient documentation

## 2018-03-13 DIAGNOSIS — O119 Pre-existing hypertension with pre-eclampsia, unspecified trimester: Secondary | ICD-10-CM

## 2018-03-13 DIAGNOSIS — O113 Pre-existing hypertension with pre-eclampsia, third trimester: Secondary | ICD-10-CM | POA: Diagnosis not present

## 2018-03-13 DIAGNOSIS — O09899 Supervision of other high risk pregnancies, unspecified trimester: Secondary | ICD-10-CM | POA: Diagnosis present

## 2018-03-13 DIAGNOSIS — O358XX2 Maternal care for other (suspected) fetal abnormality and damage, fetus 2: Secondary | ICD-10-CM

## 2018-03-13 DIAGNOSIS — O10013 Pre-existing essential hypertension complicating pregnancy, third trimester: Secondary | ICD-10-CM | POA: Insufficient documentation

## 2018-03-13 DIAGNOSIS — Z3A35 35 weeks gestation of pregnancy: Secondary | ICD-10-CM

## 2018-03-13 DIAGNOSIS — IMO0001 Reserved for inherently not codable concepts without codable children: Secondary | ICD-10-CM

## 2018-03-13 DIAGNOSIS — O09893 Supervision of other high risk pregnancies, third trimester: Secondary | ICD-10-CM | POA: Diagnosis not present

## 2018-03-13 DIAGNOSIS — O09523 Supervision of elderly multigravida, third trimester: Secondary | ICD-10-CM

## 2018-03-13 NOTE — Progress Notes (Signed)
Subjective:  Colleen Terrell is a 37 y.o. G4P3004 at 1626w0d being seen today for ongoing prenatal care.  She is currently monitored for the following issues for this high-risk pregnancy and has Twin gestation, dichorionic diamniotic; Advanced maternal age in multigravida; Supervision of other high risk pregnancies, unspecified trimester; Chronic hypertension during pregnancy, antepartum; Previous cesarean delivery affecting pregnancy; Chronic hypertension with superimposed pre-eclampsia; Proteinuria in pregnancy, antepartum; and Umbilical vein abnormality affecting pregnancy, fetus 2 on their problem list.  Patient reports right upper back and shoulder pain. Denies HA, visual changes or epigastric pain..  Contractions: Not present. Vag. Bleeding: None.  Movement: Present. Denies leaking of fluid.   The following portions of the patient's history were reviewed and updated as appropriate: allergies, current medications, past family history, past medical history, past social history, past surgical history and problem list. Problem list updated.  Objective:   Vitals:   03/13/18 1005 03/13/18 1009  BP: (!) 151/97 (!) 149/91  Pulse: 75 79  Weight: 283 lb 8 oz (128.6 kg)     Fetal Status: Fetal Heart Rate (bpm): 151/143   Movement: Present     General:  Alert, oriented and cooperative. Patient is in no acute distress.  Skin: Skin is warm and dry. No rash noted.   Cardiovascular: Normal heart rate noted  Respiratory: Normal respiratory effort, no problems with respiration noted  Abdomen: Soft, gravid, appropriate for gestational age. Pain/Pressure: Present     Pelvic:  Cervical exam performed        Extremities: Normal range of motion.  Edema: Trace  Mental Status: Normal mood and affect. Normal behavior. Normal judgment and thought content.   Urinalysis:      Assessment and Plan:  Pregnancy: [id number] at 3126w0d  1. Supervision of other high risk pregnancies, unspecified  trimester Stable - GC[facility]Chlam[facility]hn[facility] - Culture, beta strep (group b only)  2. Dichorionic diamniotic twin pregnancy in third trimester BPP 8/8 today  3. Chronic hypertension with superimposed pre-eclampsia BP stable Continue with Labetalol and BASA No S/Sx of PEC  4. Multigravida of advanced maternal age in third trimester Stable  5. Previous cesarean delivery affecting pregnancy For repeat c section at 37 weeks Would like to schedule until after 03/25/18 due to help not arriving in US until 7/30 Surgery checked and no available OR time. Pt informed. Will keep c section as scheduled    6. Umbilical vein abnormality affecting pregnancy, fetus 2 BPP 8/8 today  Preterm labor symptoms and general obstetric precautions including but not limited to vaginal bleeding, contractions, leaking of fluid and fetal movement were reviewed in detail with the patient. Please refer to After Visit Summary for other counseling recommendations.  Return in about 1 week (around 03/20/2018) for OB visit.   [name], [name], MD

## 2018-03-13 NOTE — Patient Instructions (Signed)

## 2018-03-14 LAB — GC/CHLAMYDIA PROBE AMP (~~LOC~~) NOT AT ARMC
Chlamydia: NEGATIVE
Neisseria Gonorrhea: NEGATIVE

## 2018-03-16 LAB — CULTURE, BETA STREP (GROUP B ONLY): Strep Gp B Culture: POSITIVE — AB

## 2018-03-19 ENCOUNTER — Inpatient Hospital Stay (HOSPITAL_COMMUNITY)
Admission: AD | Admit: 2018-03-19 | Discharge: 2018-03-22 | DRG: 788 | Disposition: A | Discharge status: Home / Self Care | Payer: Medicaid Other | Attending: Family Medicine | Admitting: Family Medicine

## 2018-03-19 ENCOUNTER — Other Ambulatory Visit: Payer: Self-pay

## 2018-03-19 ENCOUNTER — Inpatient Hospital Stay (HOSPITAL_COMMUNITY): Payer: Medicaid Other | Admitting: Anesthesiology

## 2018-03-19 ENCOUNTER — Encounter (HOSPITAL_COMMUNITY)
Admission: RE | Admit: 2018-03-19 | Discharge: 2018-03-19 | Disposition: A | Discharge status: Home / Self Care | Payer: Medicaid Other | Source: Ambulatory Visit | Attending: Obstetrics and Gynecology | Admitting: Obstetrics and Gynecology

## 2018-03-19 ENCOUNTER — Inpatient Hospital Stay (HOSPITAL_BASED_OUTPATIENT_CLINIC_OR_DEPARTMENT_OTHER): Payer: Medicaid Other

## 2018-03-19 ENCOUNTER — Encounter (HOSPITAL_COMMUNITY)
Admission: AD | Disposition: A | Discharge status: Home / Self Care | Payer: Self-pay | Source: Home / Self Care | Attending: Family Medicine

## 2018-03-19 ENCOUNTER — Encounter (HOSPITAL_COMMUNITY): Payer: Self-pay

## 2018-03-19 ENCOUNTER — Inpatient Hospital Stay (HOSPITAL_COMMUNITY): Admission: RE | Admit: 2018-03-19 | Payer: Medicaid Other | Source: Ambulatory Visit | Admitting: Family Medicine

## 2018-03-19 DIAGNOSIS — O1002 Pre-existing essential hypertension complicating childbirth: Secondary | ICD-10-CM | POA: Diagnosis present

## 2018-03-19 DIAGNOSIS — O328XX1 Maternal care for other malpresentation of fetus, fetus 1: Secondary | ICD-10-CM | POA: Diagnosis present

## 2018-03-19 DIAGNOSIS — O114 Pre-existing hypertension with pre-eclampsia, complicating childbirth: Secondary | ICD-10-CM | POA: Diagnosis present

## 2018-03-19 DIAGNOSIS — O09523 Supervision of elderly multigravida, third trimester: Secondary | ICD-10-CM

## 2018-03-19 DIAGNOSIS — O34211 Maternal care for low transverse scar from previous cesarean delivery: Secondary | ICD-10-CM | POA: Diagnosis present

## 2018-03-19 DIAGNOSIS — O30043 Twin pregnancy, dichorionic/diamniotic, third trimester: Secondary | ICD-10-CM | POA: Diagnosis present

## 2018-03-19 DIAGNOSIS — O09899 Supervision of other high risk pregnancies, unspecified trimester: Secondary | ICD-10-CM

## 2018-03-19 DIAGNOSIS — Z3A36 36 weeks gestation of pregnancy: Secondary | ICD-10-CM

## 2018-03-19 DIAGNOSIS — O119 Pre-existing hypertension with pre-eclampsia, unspecified trimester: Secondary | ICD-10-CM

## 2018-03-19 DIAGNOSIS — O329XX0 Maternal care for malpresentation of fetus, unspecified, not applicable or unspecified: Secondary | ICD-10-CM

## 2018-03-19 DIAGNOSIS — O1092 Unspecified pre-existing hypertension complicating childbirth: Secondary | ICD-10-CM | POA: Diagnosis not present

## 2018-03-19 DIAGNOSIS — O10013 Pre-existing essential hypertension complicating pregnancy, third trimester: Secondary | ICD-10-CM

## 2018-03-19 DIAGNOSIS — Z98891 History of uterine scar from previous surgery: Secondary | ICD-10-CM

## 2018-03-19 DIAGNOSIS — O320XX1 Maternal care for unstable lie, fetus 1: Secondary | ICD-10-CM | POA: Diagnosis not present

## 2018-03-19 DIAGNOSIS — O321XX1 Maternal care for breech presentation, fetus 1: Secondary | ICD-10-CM

## 2018-03-19 DIAGNOSIS — O1490 Unspecified pre-eclampsia, unspecified trimester: Secondary | ICD-10-CM | POA: Diagnosis present

## 2018-03-19 HISTORY — DX: Other complications of anesthesia, initial encounter: T88.59XA

## 2018-03-19 HISTORY — DX: Adverse effect of unspecified anesthetic, initial encounter: T41.45XA

## 2018-03-19 LAB — COMPREHENSIVE METABOLIC PANEL
ALBUMIN: 2.7 g/dL — AB (ref 3.5–5.0)
ALT: 29 U/L (ref 0–44)
ANION GAP: 9 (ref 5–15)
AST: 37 U/L (ref 15–41)
Alkaline Phosphatase: 162 U/L — ABNORMAL HIGH (ref 38–126)
BILIRUBIN TOTAL: 0.4 mg/dL (ref 0.3–1.2)
BUN: 10 mg/dL (ref 6–20)
CO2: 20 mmol/L — AB (ref 22–32)
Calcium: 8.4 mg/dL — ABNORMAL LOW (ref 8.9–10.3)
Chloride: 106 mmol/L (ref 98–111)
Creatinine, Ser: 0.74 mg/dL (ref 0.44–1.00)
GFR calc Af Amer: >60 mL/min (ref >60)
GFR calc non Af Amer: >60 mL/min (ref >60)
Glucose, Bld: 98 mg/dL (ref 70–99)
POTASSIUM: 4 mmol/L (ref 3.5–5.1)
SODIUM: 135 mmol/L (ref 135–145)
TOTAL PROTEIN: 6.5 g/dL (ref 6.5–8.1)

## 2018-03-19 LAB — CBC WITH DIFFERENTIAL/PLATELET
BASOS ABS: 0 10*3/uL (ref 0.0–0.1)
Basophils Relative: 0 %
EOS ABS: 0.2 10*3/uL (ref 0.0–0.7)
Eosinophils Relative: 2 %
HEMATOCRIT: 30 % — AB (ref 36.0–46.0)
HEMOGLOBIN: 9.6 g/dL — AB (ref 12.0–15.0)
Lymphocytes Relative: 35 %
Lymphs Abs: 2.4 10*3/uL (ref 0.7–4.0)
MCH: 25.5 pg — ABNORMAL LOW (ref 26.0–34.0)
MCHC: 32 g/dL (ref 30.0–36.0)
MCV: 79.6 fL (ref 78.0–100.0)
Monocytes Absolute: 0.3 10*3/uL (ref 0.1–1.0)
Monocytes Relative: 4 %
NEUTROS ABS: 3.9 10*3/uL (ref 1.7–7.7)
NEUTROS PCT: 59 %
Platelets: 292 10*3/uL (ref 150–400)
RBC: 3.77 MIL/uL — ABNORMAL LOW (ref 3.87–5.11)
RDW: 15 % (ref 11.5–15.5)
WBC: 6.7 10*3/uL (ref 4.0–10.5)

## 2018-03-19 LAB — URINALYSIS, ROUTINE W REFLEX MICROSCOPIC
BILIRUBIN URINE: NEGATIVE
Glucose, UA: NEGATIVE mg/dL
Hgb urine dipstick: NEGATIVE
Ketones, ur: NEGATIVE mg/dL
Nitrite: NEGATIVE
PROTEIN: 30 mg/dL — AB
Specific Gravity, Urine: 1.023 (ref 1.005–1.030)
pH: 5 (ref 5.0–8.0)

## 2018-03-19 LAB — TYPE AND SCREEN
ABO/RH(D): O POS
Antibody Screen: NEGATIVE

## 2018-03-19 LAB — PROTEIN / CREATININE RATIO, URINE
Creatinine, Urine: 305 mg/dL
PROTEIN CREATININE RATIO: 0.12 mg/mg{creat} (ref 0.00–0.15)
Total Protein, Urine: 36 mg/dL

## 2018-03-19 SURGERY — Surgical Case
Anesthesia: Spinal

## 2018-03-19 MED ORDER — FENTANYL CITRATE (PF) 100 MCG/2ML IJ SOLN: 25.0000 ug | INTRAMUSCULAR | Status: DC | PRN

## 2018-03-19 MED ORDER — LABETALOL HCL 200 MG PO TABS
200.0000 mg | ORAL_TABLET | Freq: Two times a day (BID) | ORAL | Status: DC
  Administered 2018-03-19 – 2018-03-22 (×6): 200 mg via ORAL
  Filled 2018-03-19: qty 1
  Filled 2018-03-19: qty 2
  Filled 2018-03-19 (×4): qty 1

## 2018-03-19 MED ORDER — MENTHOL 3 MG MT LOZG: 1.0000 | LOZENGE | OROMUCOSAL | Status: DC | PRN

## 2018-03-19 MED ORDER — OXYCODONE-ACETAMINOPHEN 5-325 MG PO TABS
2.0000 | ORAL_TABLET | ORAL | Status: DC | PRN
  Filled 2018-03-19 (×7): qty 2

## 2018-03-19 MED ORDER — MORPHINE SULFATE (PF) 0.5 MG/ML IJ SOLN
INTRAMUSCULAR | Status: DC | PRN
  Administered 2018-03-19: .2 mg via INTRATHECAL

## 2018-03-19 MED ORDER — SIMETHICONE 80 MG PO CHEW: 80.0000 mg | CHEWABLE_TABLET | ORAL | Status: DC | PRN

## 2018-03-19 MED ORDER — MAGNESIUM SULFATE 40 G IN LACTATED RINGERS - SIMPLE
2.0000 g/h | INTRAVENOUS | Status: DC
  Filled 2018-03-19: qty 500

## 2018-03-19 MED ORDER — MAGNESIUM SULFATE BOLUS VIA INFUSION: 0.1000 g | Freq: Once | INTRAVENOUS | Status: DC

## 2018-03-19 MED ORDER — MAGNESIUM SULFATE BOLUS VIA INFUSION
4.0000 g | Freq: Once | INTRAVENOUS | Status: AC
  Administered 2018-03-19: 4 g via INTRAVENOUS
  Filled 2018-03-19: qty 500

## 2018-03-19 MED ORDER — HYDRALAZINE HCL 20 MG/ML IJ SOLN
10.0000 mg | Freq: Once | INTRAMUSCULAR | Status: AC | PRN
  Administered 2018-03-19: 10 mg via INTRAVENOUS
  Filled 2018-03-19: qty 1

## 2018-03-19 MED ORDER — SOD CITRATE-CITRIC ACID 500-334 MG/5ML PO SOLN
30.0000 mL | ORAL | Status: DC | PRN
  Administered 2018-03-19: 30 mL via ORAL

## 2018-03-19 MED ORDER — MISOPROSTOL 200 MCG PO TABS
800.0000 ug | ORAL_TABLET | Freq: Once | ORAL | Status: AC
  Administered 2018-03-19: 800 ug via ORAL

## 2018-03-19 MED ORDER — LACTATED RINGERS IV SOLN
120.0000 mL/h | INTRAVENOUS | Status: DC
  Administered 2018-03-19: 19:00:00 via INTRAVENOUS

## 2018-03-19 MED ORDER — TRANEXAMIC ACID 1000 MG/10ML IV SOLN
1000.0000 mg | Freq: Once | INTRAVENOUS | Status: AC
  Administered 2018-03-19: 1000 mg via INTRAVENOUS
  Filled 2018-03-19: qty 1100

## 2018-03-19 MED ORDER — ENOXAPARIN SODIUM 80 MG/0.8ML ~~LOC~~ SOLN
0.5000 mg/kg | SUBCUTANEOUS | Status: DC
  Administered 2018-03-20 – 2018-03-22 (×3): 65 mg via SUBCUTANEOUS
  Filled 2018-03-19 (×4): qty 0.8

## 2018-03-19 MED ORDER — ACETAMINOPHEN 325 MG PO TABS: 650.0000 mg | ORAL_TABLET | ORAL | Status: DC | PRN

## 2018-03-19 MED ORDER — OXYCODONE-ACETAMINOPHEN 5-325 MG PO TABS
1.0000 | ORAL_TABLET | ORAL | Status: DC | PRN
  Administered 2018-03-20 (×3): 1 via ORAL
  Filled 2018-03-19 (×2): qty 1

## 2018-03-19 MED ORDER — LABETALOL HCL 5 MG/ML IV SOLN
20.0000 mg | INTRAVENOUS | Status: AC | PRN
  Administered 2018-03-19: 20 mg via INTRAVENOUS
  Administered 2018-03-19: 80 mg via INTRAVENOUS
  Administered 2018-03-19: 40 mg via INTRAVENOUS
  Filled 2018-03-19: qty 4
  Filled 2018-03-19: qty 8
  Filled 2018-03-19: qty 16

## 2018-03-19 MED ORDER — OXYCODONE-ACETAMINOPHEN 5-325 MG PO TABS
2.0000 | ORAL_TABLET | ORAL | Status: DC | PRN
  Administered 2018-03-21 – 2018-03-22 (×5): 2 via ORAL

## 2018-03-19 MED ORDER — ZOLPIDEM TARTRATE 5 MG PO TABS: 5.0000 mg | ORAL_TABLET | Freq: Every evening | ORAL | Status: DC | PRN

## 2018-03-19 MED ORDER — TRANEXAMIC ACID 1000 MG/10ML IV SOLN
INTRAVENOUS | Status: AC
  Filled 2018-03-19: qty 10

## 2018-03-19 MED ORDER — OXYCODONE-ACETAMINOPHEN 5-325 MG PO TABS: 1.0000 | ORAL_TABLET | ORAL | Status: DC | PRN

## 2018-03-19 MED ORDER — OXYCODONE HCL 5 MG/5ML PO SOLN: 5.0000 mg | Freq: Once | ORAL | Status: DC | PRN

## 2018-03-19 MED ORDER — OXYTOCIN 40 UNITS IN LACTATED RINGERS INFUSION - SIMPLE MED: 2.5000 [IU]/h | INTRAVENOUS | Status: DC

## 2018-03-19 MED ORDER — LABETALOL HCL 5 MG/ML IV SOLN
INTRAVENOUS | Status: AC
  Filled 2018-03-19: qty 4

## 2018-03-19 MED ORDER — OXYTOCIN 40 UNITS IN LACTATED RINGERS INFUSION - SIMPLE MED: 2.5000 [IU]/h | INTRAVENOUS | Status: AC

## 2018-03-19 MED ORDER — TETANUS-DIPHTH-ACELL PERTUSSIS 5-2.5-18.5 LF-MCG/0.5 IM SUSP: 0.5000 mL | Freq: Once | INTRAMUSCULAR | Status: DC

## 2018-03-19 MED ORDER — IBUPROFEN 600 MG PO TABS
600.0000 mg | ORAL_TABLET | Freq: Four times a day (QID) | ORAL | Status: DC
  Administered 2018-03-20 – 2018-03-22 (×10): 600 mg via ORAL
  Filled 2018-03-19 (×10): qty 1

## 2018-03-19 MED ORDER — LACTATED RINGERS IV SOLN
INTRAVENOUS | Status: DC | PRN
  Administered 2018-03-19: 40 [IU] via INTRAVENOUS

## 2018-03-19 MED ORDER — SCOPOLAMINE 1 MG/3DAYS TD PT72
1.0000 | MEDICATED_PATCH | Freq: Once | TRANSDERMAL | Status: AC
  Administered 2018-03-19: 1.5 mg via TRANSDERMAL

## 2018-03-19 MED ORDER — FENTANYL CITRATE (PF) 100 MCG/2ML IJ SOLN
INTRAMUSCULAR | Status: DC | PRN
  Administered 2018-03-19: 10 ug via INTRATHECAL

## 2018-03-19 MED ORDER — MEPERIDINE HCL 25 MG/ML IJ SOLN
6.2500 mg | INTRAMUSCULAR | Status: AC | PRN
  Administered 2018-03-19 (×2): 6.25 mg via INTRAVENOUS

## 2018-03-19 MED ORDER — LACTATED RINGERS IV SOLN
INTRAVENOUS | Status: AC
  Administered 2018-03-20: 07:00:00 via INTRAVENOUS

## 2018-03-19 MED ORDER — COCONUT OIL OIL
1.0000 "application " | TOPICAL_OIL | Status: DC | PRN
  Administered 2018-03-20: 1 via TOPICAL
  Filled 2018-03-19: qty 120

## 2018-03-19 MED ORDER — PRENATAL MULTIVITAMIN CH
1.0000 | ORAL_TABLET | Freq: Every day | ORAL | Status: DC
  Administered 2018-03-20 – 2018-03-22 (×3): 1 via ORAL
  Filled 2018-03-19 (×3): qty 1

## 2018-03-19 MED ORDER — LABETALOL HCL 5 MG/ML IV SOLN
20.0000 mg | INTRAVENOUS | Status: DC | PRN
  Administered 2018-03-19: 20 mg via INTRAVENOUS
  Filled 2018-03-19: qty 8

## 2018-03-19 MED ORDER — SOD CITRATE-CITRIC ACID 500-334 MG/5ML PO SOLN
ORAL | Status: AC
  Administered 2018-03-19: 30 mL via ORAL
  Filled 2018-03-19: qty 15

## 2018-03-19 MED ORDER — OXYTOCIN BOLUS FROM INFUSION: 500.0000 mL | Freq: Once | INTRAVENOUS | Status: DC

## 2018-03-19 MED ORDER — NALOXONE HCL 0.4 MG/ML IJ SOLN: 0.4000 mg | INTRAMUSCULAR | Status: DC | PRN

## 2018-03-19 MED ORDER — BUPIVACAINE IN DEXTROSE 0.75-8.25 % IT SOLN
INTRATHECAL | Status: DC | PRN
  Administered 2018-03-19: 1.6 mL via INTRATHECAL

## 2018-03-19 MED ORDER — SCOPOLAMINE 1 MG/3DAYS TD PT72
MEDICATED_PATCH | TRANSDERMAL | Status: AC
  Administered 2018-03-19: 1.5 mg via TRANSDERMAL
  Filled 2018-03-19: qty 1

## 2018-03-19 MED ORDER — OXYCODONE HCL 5 MG PO TABS: 5.0000 mg | ORAL_TABLET | Freq: Once | ORAL | Status: DC | PRN

## 2018-03-19 MED ORDER — SENNOSIDES-DOCUSATE SODIUM 8.6-50 MG PO TABS
2.0000 | ORAL_TABLET | ORAL | Status: DC
  Administered 2018-03-19 – 2018-03-22 (×3): 2 via ORAL
  Filled 2018-03-19 (×3): qty 2

## 2018-03-19 MED ORDER — WITCH HAZEL-GLYCERIN EX PADS: 1.0000 "application " | MEDICATED_PAD | CUTANEOUS | Status: DC | PRN

## 2018-03-19 MED ORDER — ONDANSETRON HCL 4 MG/2ML IJ SOLN
INTRAMUSCULAR | Status: DC | PRN
  Administered 2018-03-19: 4 mg via INTRAVENOUS

## 2018-03-19 MED ORDER — LACTATED RINGERS IV SOLN: 500.0000 mL | INTRAVENOUS | Status: DC | PRN

## 2018-03-19 MED ORDER — TRANEXAMIC ACID 1000 MG/10ML IV SOLN
1000.0000 mg | INTRAVENOUS | Status: AC
  Administered 2018-03-19: 1000 mg via INTRAVENOUS

## 2018-03-19 MED ORDER — SIMETHICONE 80 MG PO CHEW
80.0000 mg | CHEWABLE_TABLET | ORAL | Status: DC
  Administered 2018-03-19: 80 mg via ORAL
  Filled 2018-03-19: qty 1

## 2018-03-19 MED ORDER — LACTATED RINGERS IV SOLN: INTRAVENOUS | Status: DC

## 2018-03-19 MED ORDER — METHYLERGONOVINE MALEATE 0.2 MG PO TABS
0.2000 mg | ORAL_TABLET | ORAL | Status: AC
  Administered 2018-03-19 – 2018-03-20 (×6): 0.2 mg via ORAL
  Filled 2018-03-19 (×6): qty 1

## 2018-03-19 MED ORDER — KETOROLAC TROMETHAMINE 30 MG/ML IJ SOLN: 30.0000 mg | Freq: Four times a day (QID) | INTRAMUSCULAR | Status: AC | PRN

## 2018-03-19 MED ORDER — ONDANSETRON HCL 4 MG/2ML IJ SOLN
4.0000 mg | Freq: Four times a day (QID) | INTRAMUSCULAR | Status: DC | PRN
Start: 2018-03-19 — End: 2018-03-20

## 2018-03-19 MED ORDER — LIDOCAINE HCL (PF) 1 % IJ SOLN
30.0000 mL | INTRAMUSCULAR | Status: DC | PRN
  Filled 2018-03-19: qty 30

## 2018-03-19 MED ORDER — LACTATED RINGERS IV SOLN
INTRAVENOUS | Status: DC
  Administered 2018-03-19: 12:00:00 via INTRAVENOUS

## 2018-03-19 MED ORDER — DIBUCAINE 1 % RE OINT: 1.0000 "application " | TOPICAL_OINTMENT | RECTAL | Status: DC | PRN

## 2018-03-19 MED ORDER — BETAMETHASONE SOD PHOS & ACET 6 (3-3) MG/ML IJ SUSP
12.0000 mg | Freq: Once | INTRAMUSCULAR | Status: AC
  Administered 2018-03-19: 12 mg via INTRAMUSCULAR
  Filled 2018-03-19: qty 2

## 2018-03-19 MED ORDER — PHENYLEPHRINE 8 MG IN D5W 100 ML (0.08MG/ML) PREMIX OPTIME
INJECTION | INTRAVENOUS | Status: DC | PRN
  Administered 2018-03-19: 20 ug/min via INTRAVENOUS

## 2018-03-19 MED ORDER — MAGNESIUM SULFATE 40 G IN LACTATED RINGERS - SIMPLE
2.0000 g/h | INTRAVENOUS | Status: AC
Start: 2018-03-19 — End: 2018-03-20
  Administered 2018-03-19 – 2018-03-20 (×2): 2 g/h via INTRAVENOUS
  Filled 2018-03-19: qty 500
  Filled 2018-03-19: qty 40

## 2018-03-19 MED ORDER — SODIUM CHLORIDE 0.9% FLUSH: 3.0000 mL | INTRAVENOUS | Status: DC | PRN

## 2018-03-19 MED ORDER — DEXTROSE 5 % IV SOLN
3.0000 g | Freq: Once | INTRAVENOUS | Status: AC
  Administered 2018-03-19: 3 g via INTRAVENOUS
  Filled 2018-03-19: qty 3

## 2018-03-19 MED ORDER — MEPERIDINE HCL 25 MG/ML IJ SOLN
INTRAMUSCULAR | Status: AC
  Administered 2018-03-19: 6.25 mg via INTRAVENOUS
  Filled 2018-03-19: qty 1

## 2018-03-19 MED ORDER — PHENYLEPHRINE 40 MCG/ML (10ML) SYRINGE FOR IV PUSH (FOR BLOOD PRESSURE SUPPORT)
PREFILLED_SYRINGE | INTRAVENOUS | Status: DC | PRN
  Administered 2018-03-19 (×2): 80 ug via INTRAVENOUS

## 2018-03-19 MED ORDER — KETOROLAC TROMETHAMINE 30 MG/ML IJ SOLN
30.0000 mg | Freq: Four times a day (QID) | INTRAMUSCULAR | Status: AC | PRN
  Administered 2018-03-19: 30 mg via INTRAMUSCULAR

## 2018-03-19 MED ORDER — DEXAMETHASONE SODIUM PHOSPHATE 10 MG/ML IJ SOLN
INTRAMUSCULAR | Status: DC | PRN
  Administered 2018-03-19: 10 mg via INTRAVENOUS

## 2018-03-19 MED ORDER — SIMETHICONE 80 MG PO CHEW
80.0000 mg | CHEWABLE_TABLET | Freq: Three times a day (TID) | ORAL | Status: DC
  Administered 2018-03-20 – 2018-03-22 (×8): 80 mg via ORAL
  Filled 2018-03-19 (×9): qty 1

## 2018-03-19 MED ORDER — METHYLERGONOVINE MALEATE 0.2 MG/ML IJ SOLN
INTRAMUSCULAR | Status: DC | PRN
  Administered 2018-03-19: 0.2 mg via INTRAMUSCULAR

## 2018-03-19 MED ORDER — ONDANSETRON HCL 4 MG/2ML IJ SOLN: 4.0000 mg | Freq: Four times a day (QID) | INTRAMUSCULAR | Status: DC | PRN

## 2018-03-19 MED ORDER — ONDANSETRON HCL 4 MG/2ML IJ SOLN: 4.0000 mg | Freq: Three times a day (TID) | INTRAMUSCULAR | Status: DC | PRN

## 2018-03-19 MED ORDER — KETOROLAC TROMETHAMINE 30 MG/ML IJ SOLN
INTRAMUSCULAR | Status: AC
  Administered 2018-03-19: 30 mg via INTRAMUSCULAR
  Filled 2018-03-19: qty 1

## 2018-03-19 MED ORDER — DIPHENHYDRAMINE HCL 25 MG PO CAPS: 25.0000 mg | ORAL_CAPSULE | Freq: Four times a day (QID) | ORAL | Status: DC | PRN

## 2018-03-19 SURGICAL SUPPLY — 23 items
ADH SKN CLS LQ APL DERMABOND (GAUZE/BANDAGES/DRESSINGS) ×1
APL SKNCLS STERI-STRIP NONHPOA (GAUZE/BANDAGES/DRESSINGS) ×1
BENZOIN TINCTURE PRP APPL 2/3 (GAUZE/BANDAGES/DRESSINGS) ×3 IMPLANT
CLOSURE WOUND 1/2 X4 (GAUZE/BANDAGES/DRESSINGS) ×1
CLOTH BEACON ORANGE TIMEOUT ST (SAFETY) ×3 IMPLANT
DERMABOND ADHESIVE PROPEN (GAUZE/BANDAGES/DRESSINGS) ×2
DERMABOND ADVANCED .7 DNX6 (GAUZE/BANDAGES/DRESSINGS) IMPLANT
DRSG OPSITE POSTOP 4X10 (GAUZE/BANDAGES/DRESSINGS) ×3 IMPLANT
GLOVE BIOGEL PI IND STRL 7.0 (GLOVE) ×1 IMPLANT
GLOVE BIOGEL PI INDICATOR 7.0 (GLOVE) ×2
GLOVE ECLIPSE 6.5 STRL STRAW (GLOVE) ×3 IMPLANT
NEEDLE HYPO 22GX1.5 SAFETY (NEEDLE) ×3 IMPLANT
PAD ABD 7.5X8 STRL (GAUZE/BANDAGES/DRESSINGS) ×2 IMPLANT
SPONGE DRAIN TRACH 4X4 STRL 2S (GAUZE/BANDAGES/DRESSINGS) ×4 IMPLANT
STRIP CLOSURE SKIN 1/2X4 (GAUZE/BANDAGES/DRESSINGS) ×2 IMPLANT
SUT MON AB-0 CT1 36 (SUTURE) IMPLANT
SUT PDS AB 0 CTX 60 (SUTURE) IMPLANT
SUT PLAIN 2 0 XLH (SUTURE) ×3 IMPLANT
SUT VIC AB 0 CT1 36 (SUTURE) ×6 IMPLANT
SUT VIC AB 2-0 CT1 27 (SUTURE) ×3
SUT VIC AB 2-0 CT1 TAPERPNT 27 (SUTURE) ×1 IMPLANT
SUT VIC AB 4-0 KS 27 (SUTURE) ×3 IMPLANT
SYR CONTROL 10ML LL (SYRINGE) ×3 IMPLANT

## 2018-03-19 NOTE — MAU Provider Note (Signed)
Performed BSUS and appears and clear fetal head in LLQ/pelvis and legs in RLQ/Pelvis (visualized femur).  Unclear which twin is presenting via US. Performed cervical exam and felt sutures. Discussed with family that I feel that Twin A is vertex. Will get formal US to help clarify twin A and B.   Reviewed with family IOL for preeclampsia with severe features based on blood pressures. She is current favorable for induction and strongly desires TOLAC. Has LTCS in previous pregnancy 2016 for breech twin A and prior to this had successful vaginal delivery x2.  Discussed risks of breech vaginal delivery including but not limited to head entrapment and fetal hypoxia. Patient desires vaginal breech of twin B if twin A is vertex and voiced understanding of risks.    Federico FlakeKimberly Niles Newton, MD, MPH, ABFM Attending Physician Faculty Practice- [facility]

## 2018-03-19 NOTE — Pre-Procedure Instructions (Signed)
Pt requested Bp assessment  Bp 160/104.  Took labetalol at 0930.  No headache or blurry vision or epigastric pain.  Dr Alvester MorinNewton notified and orders received for lab and recheck BP in 5 minutes.  Call if BP > 160/110

## 2018-03-19 NOTE — Anesthesia Preprocedure Evaluation (Addendum)
Anesthesia Evaluation  Patient identified by MRN, date of birth, ID band Patient awake    Reviewed: Allergy & Precautions, H&P , NPO status , Patient's Chart, lab work & pertinent test results, reviewed documented beta blocker date and time   Airway Mallampati: III  TM Distance: >3 FB Neck ROM: Full    Dental no notable dental hx. (+) Teeth Intact   Pulmonary neg pulmonary ROS,    Pulmonary exam normal breath sounds clear to auscultation       Cardiovascular Exercise Tolerance: Good hypertension, Pt. on medications and Pt. on home beta blockers Normal cardiovascular exam Rhythm:Regular Rate:Normal     Neuro/Psych negative neurological ROS  negative psych ROS   GI/Hepatic GERD  Medicated,  Endo/Other    Renal/GU      Musculoskeletal   Abdominal (+) + obese,   Peds  Hematology   Anesthesia Other Findings   Reproductive/Obstetrics (+) Pregnancy                            Lab Results  Component Value Date   WBC 8.6 02/19/2018   HGB 9.7 (L) 02/19/2018   HCT 31.4 (L) 02/19/2018   MCV 83 02/19/2018   PLT 271 02/19/2018    Anesthesia Physical Anesthesia Plan  ASA: II  Anesthesia Plan: Spinal   Post-op Pain Management:    Induction: Intravenous  PONV Risk Score and Plan: 2 and Treatment may vary due to age or medical condition and Ondansetron  Airway Management Planned: Nasal Cannula  Additional Equipment:   Intra-op Plan:   Post-operative Plan:   Informed Consent: I have reviewed the patients History and Physical, chart, labs and discussed the procedure including the risks, benefits and alternatives for the proposed anesthesia with the patient or authorized representative who has indicated his/her understanding and acceptance.     Plan Discussed with: CRNA, Anesthesiologist and Surgeon  Anesthesia Plan Comments:        Anesthesia Quick Evaluation

## 2018-03-19 NOTE — Patient Instructions (Signed)
Colleen Terrell  03/19/2018   Your procedure is scheduled on:  03/20/2018  [facility] through [facility] at 0730 AM.  Pick up the phone at the desk and dial [phone]  Call this number if you have problems the morning of surgery:[phone]  Remember:   Do not eat food:(After Midnight) Desps de medianoche.  Do not drink clear liquids: (After Midnight) Desps de medianoche.  Take these medicines the morning of surgery with A SIP OF WATER: take labetalol as prescribed and may take carafate   Do not wear jewelry, make-up or nail polish.  Do not wear lotions, powders, or perfumes. Do not wear deodorant.  Do not shave 48 hours prior to surgery.  Do not bring valuables to the hospital.  [facility] is not   responsible for any belongings or valuables brought to the hospital.  Contacts, dentures or bridgework may not be worn into surgery.  Leave suitcase in the car. After surgery it may be brought to your room.  For patients admitted to the hospital, checkout time is 11:00 AM the day of              discharge.    N/A   Please read over the following fact sheets that you were given:   Surgical Site Infection Prevention

## 2018-03-19 NOTE — Anesthesia Postprocedure Evaluation (Signed)
Anesthesia Post Note  Patient: Shaquila Oumarou-Sidibe  Procedure(s) Performed: REPEAT CESAREAN SECTION MULTI-GESTATIONAL (N/A )     Patient location during evaluation: PACU Anesthesia Type: Spinal Level of consciousness: oriented and awake and alert Pain management: pain level controlled Vital Signs Assessment: post-procedure vital signs reviewed and stable Respiratory status: spontaneous breathing and respiratory function stable Cardiovascular status: blood pressure returned to baseline and stable Postop Assessment: no headache, no backache and no apparent nausea or vomiting Anesthetic complications: no    Last Vitals:  Vitals:   03/19/18 2000 03/19/18 2015  BP: (!) 146/92 (!) 141/96  Pulse: 72 70  Resp: 11 11  Temp:    SpO2: 98% 98%    Last Pain:  Vitals:   03/19/18 1920  TempSrc: Oral  PainSc: 0-No pain   Pain Goal:                 Lowella CurbWarren Ray Audon Heymann

## 2018-03-19 NOTE — Transfer of Care (Signed)
Immediate Anesthesia Transfer of Care Note  Patient: Colleen Terrell  Procedure(s) Performed: REPEAT CESAREAN SECTION MULTI-GESTATIONAL (N/A )  Patient Location: PACU  Anesthesia Type:Spinal  Level of Consciousness: awake, alert  and oriented  Airway & Oxygen Therapy: Patient Spontanous Breathing  Post-op Assessment: Report given to RN and Post -op Vital signs reviewed and stable  Post vital signs: Reviewed and stable  Last Vitals:  Vitals Value Taken Time  BP    Temp    Pulse    Resp    SpO2      Last Pain:  Vitals:   03/19/18 1624  TempSrc:   PainSc: 4          Complications: No apparent anesthesia complications

## 2018-03-19 NOTE — Pre-Procedure Instructions (Signed)
161/104 BP Dr Alvester MorinNewton notified.  Pt to MAU for evaluation.

## 2018-03-19 NOTE — Pre-Procedure Instructions (Signed)
Transported to MAU.  Signing in at desk.  MAU nurses notified.

## 2018-03-19 NOTE — MAU Provider Note (Signed)
History     CSN: [id number]  Arrival date and time: 03/19/18 1100   First Provider Initiated Contact with Patient 03/19/18 1129      Chief Complaint  Patient presents with  . Hypertension   HPI Colleen Terrell is a 37 y.o. [id number] at 3029w6d who presents to MAU today from pre-op for elevated blood pressure. The patient has CHTN and is on Labetalol. Her last dose was taken at 0930 today. She has been previously diagnosed with mild pre-eclampsia with plan for delivery tomorrow by C/S. At last US [name] was breech. She denies HA, visual changes, RUQ pain today. She reports mild pedal edema. She denies vaginal bleeding, LOF or regular contractions. She reports normal fetal movement.   OB History    Gravida  4   Para  3   Term  3   Preterm      AB      Living  4     SAB      TAB      Ectopic      Multiple  1   Live Births  4           Past Medical History:  Diagnosis Date  . Complication of anesthesia    pain with surgery  . GERD (gastroesophageal reflux disease)   . History of kidney infection during pregnancy   . Hypertension   . PP NVB 02/02/12 02/02/2012  . Unspecified hemorrhoids without mention of complication     Past Surgical History:  Procedure Laterality Date  . CESAREAN SECTION N/A 09/13/2014   Procedure: CESAREAN SECTION;  Surgeon: Levie HeritageJacob J Stinson, DO;  Location: [facility] ORS;  Service: Obstetrics;  Laterality: N/A;    Family History  Problem Relation Age of Onset  . Hypertension Father   . Hypertension Mother   . Anesthesia problems Neg Hx     Social History   Tobacco Use  . Smoking status: Never Smoker  . Smokeless tobacco: Never Used  Substance Use Topics  . Alcohol use: No  . Drug use: No    Allergies: No Known Allergies  Medications Prior to Admission  Medication Sig Dispense Refill Last Dose  . aspirin 81 MG chewable tablet Chew 1 tablet (81 mg total) by mouth daily. 90 tablet 3 03/18/2018 at Unknown time  . calcium  carbonate (TUMS - DOSED IN MG ELEMENTAL CALCIUM) 500 MG chewable tablet Chew 2 tablets by mouth daily as needed for indigestion or heartburn.   03/18/2018 at Unknown time  . labetalol (NORMODYNE) 200 MG tablet Take 1 tablet (200 mg total) by mouth 2 (two) times daily. 60 tablet 3 03/19/2018 at 0930  . Prenatal Multivit-Min-Fe-FA (PRENATAL VITAMINS) 0.8 MG tablet Take 1 tablet by mouth daily. 30 tablet 12 03/18/2018 at Unknown time  . sucralfate (CARAFATE) 1 g tablet TAKE 1 TABLET (1 G TOTAL) BY MOUTH 3 (THREE) TIMES DAILY WITH MEALS. (Patient taking differently: Take 1 g by mouth 2 (two) times daily as needed (acid reflux). ) 90 tablet 0 Past Week at Unknown time    Review of Systems  Constitutional: Negative for fever.  Eyes: Negative for visual disturbance.  Cardiovascular: Positive for leg swelling.  Gastrointestinal: Negative for abdominal pain, constipation, diarrhea, nausea and vomiting.  Genitourinary: Negative for vaginal bleeding and vaginal discharge.  Neurological: Negative for headaches.   Physical Exam   Blood pressure (!) 161/94, pulse 76, temperature 98.6 F (37 C), temperature source Oral, resp. rate 16, weight 283 lb 3.2  oz (128.5 kg), last menstrual period 07/04/2017, SpO2 98 %, currently breastfeeding.  Physical Exam  Nursing note and vitals reviewed. Constitutional: She is oriented to person, place, and time. She appears well-developed and well-nourished. No distress.  HENT:  Head: Normocephalic and atraumatic.  Cardiovascular: Normal rate.  Respiratory: Effort normal.  GI: Soft. She exhibits no distension and no mass. There is no tenderness. There is no rebound and no guarding.  Musculoskeletal: She exhibits edema (1+ pitting edema of the ankles bilaterally).  Neurological: She is alert and oriented to person, place, and time. She has normal reflexes.  No clonus  Skin: Skin is warm and dry. No erythema.  Psychiatric: She has a normal mood and affect.   Results for  orders placed or performed during the hospital encounter of 03/19/18 (from the past 24 hour(s))  Protein / creatinine ratio, urine     Status: None   Collection Time: 03/19/18 11:05 AM  Result Value Ref Range   Creatinine, Urine 305.00 mg/dL   Total Protein, Urine 36 mg/dL   Protein Creatinine Ratio 0.12 0.00 - 0.15 mg/mg[Cre]  CBC with Differential/Platelet     Status: Abnormal   Collection Time: 03/19/18 11:18 AM  Result Value Ref Range   WBC 6.7 4.0 - 10.5 K/uL   RBC 3.77 (L) 3.87 - 5.11 MIL/uL   Hemoglobin 9.6 (L) 12.0 - 15.0 g/dL   HCT 96.0 (L) 45.4 - 09.8 %   MCV 79.6 78.0 - 100.0 fL   MCH 25.5 (L) 26.0 - 34.0 pg   MCHC 32.0 30.0 - 36.0 g/dL   RDW 11.9 14.7 - 82.9 %   Platelets 292 150 - 400 K/uL   Neutrophils Relative % 59 %   Neutro Abs 3.9 1.7 - 7.7 K/uL   Lymphocytes Relative 35 %   Lymphs Abs 2.4 0.7 - 4.0 K/uL   Monocytes Relative 4 %   Monocytes Absolute 0.3 0.1 - 1.0 K/uL   Eosinophils Relative 2 %   Eosinophils Absolute 0.2 0.0 - 0.7 K/uL   Basophils Relative 0 %   Basophils Absolute 0.0 0.0 - 0.1 K/uL  Comprehensive metabolic panel     Status: Abnormal   Collection Time: 03/19/18 11:18 AM  Result Value Ref Range   Sodium 135 135 - 145 mmol/L   Potassium 4.0 3.5 - 5.1 mmol/L   Chloride 106 98 - 111 mmol/L   CO2 20 (L) 22 - 32 mmol/L   Glucose, Bld 98 70 - 99 mg/dL   BUN 10 6 - 20 mg/dL   Creatinine, Ser 5.62 0.44 - 1.00 mg/dL   Calcium 8.4 (L) 8.9 - 10.3 mg/dL   Total Protein 6.5 6.5 - 8.1 g/dL   Albumin 2.7 (L) 3.5 - 5.0 g/dL   AST 37 15 - 41 U/L   ALT 29 0 - 44 U/L   Alkaline Phosphatase 162 (H) 38 - 126 U/L   Total Bilirubin 0.4 0.3 - 1.2 mg/dL   GFR calc non Af Amer >60 >60 mL/min   GFR calc Af Amer >60 >60 mL/min   Anion gap 9 5 - 15   Patient Vitals for the past 24 hrs:  BP Temp Temp src Pulse Resp SpO2 Weight  03/19/18 1332 (!) 161/94 - - 76 - - -  03/19/18 1330 - - - - - 98 % -  03/19/18 1325 - - - - - 99 % -  03/19/18 1309 (!) 186/103 - -  83 - - -  03/19/18 1300 Marland Kitchen)  156/112 - - 97 - 99 % -  03/19/18 1255 - - - - - 99 % -  03/19/18 1250 - - - - - 99 % -  03/19/18 1247 (!) 152/90 - - 79 - - -  03/19/18 1245 - - - - - 99 % -  03/19/18 1240 - - - - - 99 % -  03/19/18 1235 - - - - - 99 % -  03/19/18 1225 - - - - - 99 % -  03/19/18 1220 - - - - - 98 % -  03/19/18 1217 (!) 154/93 - - 81 - - -  03/19/18 1215 - - - - - 99 % -  03/19/18 1210 - - - - - 99 % -  03/19/18 1205 - - - - - 99 % -  03/19/18 1202 (!) 152/85 - - 79 - - -  03/19/18 1200 - - - - - 98 % -  03/19/18 1159 (!) 157/82 - - 78 - - -  03/19/18 1155 - - - - - 99 % -  03/19/18 1150 - - - - - 99 % -  03/19/18 1147 (!) 170/97 - - 85 - - -  03/19/18 1145 - - - - - 98 % -  03/19/18 1140 - - - - - 98 % -  03/19/18 1135 - - - - - 100 % -  03/19/18 1131 (!) 174/107 - - 85 - - -  03/19/18 1130 - - - - - 99 % -  03/19/18 1125 - - - - - 99 % -  03/19/18 1121 (!) 163/87 - - 84 - - -  03/19/18 1120 - - - - - 100 % -  03/19/18 1117 (!) 166/99 98.6 F (37 C) Oral 88 16 - -  03/19/18 1115 - - - - - 99 % -  03/19/18 1110 - - - - - - 283 lb 3.2 oz (128.5 kg)    MAU Course  Procedures Pt informed that the ultrasound is considered a limited OB ultrasound and is not intended to be a complete ultrasound exam.  Patient also informed that the ultrasound is not being completed with the intent of assessing for fetal or placental anomalies or any pelvic abnormalities.  Explained that the purpose of today's ultrasound is to assess for  presentation.  Patient acknowledges the purpose of the exam and the limitations of the study. [name] appears to be breech and Baby B appears to be vertex.   MDM CBC, CMP, UA and urine protein/creatinine ratio today  Serial BPs reveal severe range BP x 3 HTN protocol initiated and IV started  Informed Dr. Alvester Morin of patient. She recommends BMZ now and admission. Dr. Alvester Morin present in MAU to confirm presentation prior to admission with bedside US and  discuss plan with patient.   Assessment and Plan  A: Twin IUP at [redacted]w[redacted]d Previous C/S [facility] with superimposed severe pre-eclampsia   P:  Admit to L&D for Mag, formal US to confirm presentation, consider IOL if [name] is vertex  Vonzella Nipple, PA-C 03/19/2018, 1:34 PM

## 2018-03-19 NOTE — Op Note (Signed)
Preoperative diagnosis:  1.  Intrauterine pregnancy at [redacted]w[redacted]d  weeks gestation                                         2.  Mercie Eon Twins                                         3.  CHTN with superimposed pre eclampsia                                         4.  Twin A breech   Postoperative diagnosis:  Same as above plus very thin lower uterine segment, not a true dehiscence but very very thin  Procedure:  Repeat low transverse cesarean section  Surgeon:  Lazaro Arms MD  Assistant:    Anesthesia: Spinal  Findings:  .    Over a low transverse incision was delivered a viable female at 85, double footling breech with Apgars of 7/8 and  weighing pending lbs.  oz. Twin B was a viable female delivered vertex at 1828 with Apgars pending.  Cord gases for both twins were pending and arterial..  Uterus, tubes and ovaries were all normal otherwise, just the extremely thin lower uterine segment There were no other significant findings.  Description of operation:  Patient was taken to the operating room and placed in the sitting position where she underwent a spinal anesthetic. She was then placed in the supine position with tilt to the left side. When adequate anesthetic level was obtained she was prepped and draped in usual sterile fashion and a Foley catheter was placed. A Pfannenstiel skin incision was made and carried down sharply to the rectus fascia which was scored in the midline extended laterally. The fascia was taken off the muscles both superiorly and without difficulty. The muscles were divided.  The peritoneal cavity was entered.  Bladder blade was placed, no bladder flap was created.  The lower uterine segment was translucent and very thin.  A low transverse hysterotomy incision was made and delivered a viable female, Twin A, double footling breech,  at 1826    with Apgars of 7 and 8 weighingpending lbs  oz.  Cord pH was obtained and was pending.  Twin B was a female, vertex. Apgars penidng cord  gas and weight pending as well.  The uterus was exteriorized. It was closed in 2 layers, the first being a running interlocking layer and the second being an imbricating layer using 0 monocryl on a CTX needle. There was good resulting hemostasis. The uterus tubes and ovaries were all normal. Peritoneal cavity was irrigated vigorously. The muscles and peritoneum were reapproximated loosely. The fascia was closed using 0 Vicryl in running fashion. Subcutaneous tissue was made hemostatic and irrigated. The skin was closed using 4-0 Vicryl on a Keith needle in a subcuticular fashion.  Dermabond was placed for additional wound integrity and to serve as a barrier. Blood loss for the procedure was 640 cc. The patient received 3 gram of Ancef prophylactically. The patient was taken to the recovery room in good stable condition with all counts being correct x3.  There was significant uterine hypotonia and she was given  methergine intraoperatively.  About 5-00 cc of blood was expressed from the uterus after the procedure and she was then given 800 micrograms of cytotec, buccally and 1 gram TXA IV.  The TXA will be repeated in 2 hours, hemabate will be given if needed and she will be continued on a methergine series.  If needed BP management will be given as well.  EBL 640 cc  Lazaro ArmsLuther H Malessa Zartman 03/19/2018 6:59 PM

## 2018-03-19 NOTE — MAU Note (Addendum)
Pt sent from pre-op for elevated pressures, no headache, visual changes or RUQ pain. Some edema in lower extremities. Last week [name] was transverse.   Taking labetalol 200 mg 2x a day. Took dose this morning.

## 2018-03-19 NOTE — Anesthesia Procedure Notes (Signed)
Spinal  Patient location during procedure: OB Start time: 03/19/2018 5:49 PM End time: 03/19/2018 5:54 PM Staffing Anesthesiologist: Lowella CurbMiller, Yaroslav Gombos Ray, MD Performed: anesthesiologist  Preanesthetic Checklist Completed: patient identified, surgical consent, pre-op evaluation, timeout performed, IV checked, risks and benefits discussed and monitors and equipment checked Spinal Block Patient position: sitting Prep: site prepped and draped and DuraPrep Patient monitoring: heart rate, cardiac monitor, continuous pulse ox and blood pressure Approach: midline Location: L3-4 Injection technique: single-shot Needle Needle type: Pencan  Needle gauge: 24 G Needle length: 10 cm Assessment Sensory level: T4

## 2018-03-20 ENCOUNTER — Encounter (HOSPITAL_COMMUNITY): Payer: Self-pay | Admitting: Family Medicine

## 2018-03-20 ENCOUNTER — Other Ambulatory Visit (HOSPITAL_COMMUNITY): Payer: Medicaid Other

## 2018-03-20 LAB — CBC
HCT: 25.4 % — ABNORMAL LOW (ref 36.0–46.0)
Hemoglobin: 8.3 g/dL — ABNORMAL LOW (ref 12.0–15.0)
MCH: 25.8 pg — ABNORMAL LOW (ref 26.0–34.0)
MCHC: 32.7 g/dL (ref 30.0–36.0)
MCV: 78.9 fL (ref 78.0–100.0)
PLATELETS: 322 10*3/uL (ref 150–400)
RBC: 3.22 MIL/uL — ABNORMAL LOW (ref 3.87–5.11)
RDW: 14.9 % (ref 11.5–15.5)
WBC: 17.4 10*3/uL — AB (ref 4.0–10.5)

## 2018-03-20 LAB — RPR: RPR Ser Ql: NONREACTIVE

## 2018-03-20 MED ORDER — SODIUM CHLORIDE 0.9 % IV SOLN
510.0000 mg | Freq: Once | INTRAVENOUS | Status: DC
  Filled 2018-03-20: qty 17

## 2018-03-20 MED ORDER — SUCRALFATE 1 G PO TABS
1.0000 g | ORAL_TABLET | Freq: Two times a day (BID) | ORAL | Status: DC | PRN
  Filled 2018-03-20: qty 1

## 2018-03-20 NOTE — Lactation Note (Signed)
This note was copied from a baby's chart. Lactation Consultation Note  Patient Name: Colleen Terrell ZOXWR'UToday's Date: 03/20/2018 Reason for consult: Follow-up assessment;Early term 37-38.6wks;Infant < 6lbs(LC encouraged mom to give supplement 15 ml  if the baby doesn't settle down after diaper changes )  [name] - as LC entered the room baby had recently fed per mom and her wet and stool diaper changed by LC .     Maternal Data Has patient been taught Hand Expression?: Yes  Feeding Feeding Type: (recently fed ) Length of feed: 10 min  LATCH Score                   Interventions Interventions: Breast feeding basics reviewed  Lactation Tools Discussed/Used     Consult Status Consult Status: Follow-up Date: 03/21/18 Follow-up type: In-patient    Matilde SprangMargaret Ann Riyan Gavina 03/20/2018, 3:57 PM

## 2018-03-20 NOTE — Addendum Note (Signed)
Addendum  created 03/20/18 1020 by Armanda HeritageSterling, Chalmer Zheng M, CRNA   Intraprocedure Meds edited

## 2018-03-20 NOTE — Lactation Note (Addendum)
This note was copied from a baby's chart. Lactation Consultation Note  Patient Name: Colleen ColletBoyB Colleen Terrell ZOXWR'UToday's Date: 03/20/2018 Reason for consult: Follow-up assessment;Late-preterm 34-36.6wks;Multiple gestation  Baby B due to feed , LC changed a mec loose stool 1st . Mom latched the baby with assist for depth.  Baby fed for 5 mins and released.  Baby was transferred from NICU today and has received several bottles and is use to the quick flow.  LC recommended and encouraged mom to to be consistent with her post pumping both breast for 15 -20 mins,  Save milk for the next feeding and until volume increases alterate EBM with Twins so they both can get a taste of the EBM.  DEBP has already been set up, and per mom  Has pumped x 1 with 10 ml results.  LC reviewed the cleaning of the pump pieces and how long the formula can be used.   Mom is able to hand expressed , LC encouraged before and after pumping to enhance EBM yield.    Maternal Data Has patient been taught Hand Expression?: Yes Does the patient have breastfeeding experience prior to this delivery?: Yes  Feeding Feeding Type: Breast Fed Nipple Type: Slow - flow Length of feed: 5 min(few swallows/ baby did not fed long , mom had  recently pumped )  LATCH Score Latch: Grasps breast easily, tongue down, lips flanged, rhythmical sucking.  Audible Swallowing: A few with stimulation  Type of Nipple: Everted at rest and after stimulation  Comfort (Breast/Nipple): Soft / non-tender  Hold (Positioning): Assistance needed to correctly position infant at breast and maintain latch.  LATCH Score: 8  Interventions Interventions: Breast feeding basics reviewed;Assisted with latch;Skin to skin;Hand express  Lactation Tools Discussed/Used Tools: Pump Breast pump type: Double-Electric Breast Pump Pump Review: Setup, frequency, and cleaning(LC reviewed and encouraged after feeding )   Consult Status Consult Status:  Follow-up Date: 03/21/18 Follow-up type: In-patient    Matilde SprangMargaret Ann Mychaela Lennartz 03/20/2018, 4:04 PM

## 2018-03-20 NOTE — Progress Notes (Signed)
Subjective: Postpartum Day 1: Cesarean Delivery repeat Patient reports no complaints.    Objective: Vital signs in last 24 hours: Temp:  [97.5 F (36.4 C)-99.4 F (37.4 C)] 97.6 F (36.4 C) (07/25 19140633) Pulse Rate:  [68-97] 70 (07/25 0633) Resp:  [8-28] 17 (07/25 0633) BP: (119-186)/(69-119) 136/85 (07/25 0633) SpO2:  [94 %-100 %] 99 % (07/25 [id number]) Weight:  [283 lb 3.2 oz (128.5 kg)] 283 lb 3.2 oz (128.5 kg) (07/24 1110)  Physical Exam:  General: alert, cooperative and no distress Lochia: appropriate Uterine Fundus: firm Incision: no significant drainage DVT Evaluation: No evidence of DVT seen on physical exam.  Recent Labs    03/19/18 1118 03/20/18 0550  HGB 9.6* 8.3*  HCT 30.0* 25.4*    Assessment/Plan: Status post Cesarean section. Doing well postoperatively.  Continue current care. Stop magnesium at 1900 Monitor BP Anticipate discharge home 03/22/2018 Lazaro ArmsLuther H Eure 03/20/2018, 7:52 AM

## 2018-03-21 LAB — CBC
HEMATOCRIT: 21.7 % — AB (ref 36.0–46.0)
Hemoglobin: 7 g/dL — ABNORMAL LOW (ref 12.0–15.0)
MCH: 25.8 pg — AB (ref 26.0–34.0)
MCHC: 32.3 g/dL (ref 30.0–36.0)
MCV: 80.1 fL (ref 78.0–100.0)
PLATELETS: 250 10*3/uL (ref 150–400)
RBC: 2.71 MIL/uL — ABNORMAL LOW (ref 3.87–5.11)
RDW: 15.3 % (ref 11.5–15.5)
WBC: 14 10*3/uL — AB (ref 4.0–10.5)

## 2018-03-21 NOTE — Progress Notes (Signed)
Daily Postpartum Note  Admission Date: 03/19/2018 Current Date: 03/21/2018 10:16 AM  Colleen Terrell is a 37 y.o. [id number] POD#2 s/p rLTCS @ 2177w6d for breech. Patient admitted for severe pre-eclampsia (BP) superimposed on cHTN.  Pregnancy complicated by: Patient Active Problem List   Diagnosis Date Noted  . S/P cesarean section 03/19/2018  . Preeclampsia 03/19/2018  . Umbilical vein abnormality affecting pregnancy, fetus 2 02/28/2018  . Proteinuria in pregnancy, antepartum 02/19/2018  . Chronic hypertension with superimposed pre-eclampsia 02/11/2018  . Chronic hypertension during pregnancy, antepartum 09/18/2017  . Previous cesarean delivery affecting pregnancy 09/18/2017  . Supervision of other high risk pregnancies, unspecified trimester 09/06/2017  . Advanced maternal age in multigravida   . Twin gestation, dichorionic diamniotic 03/18/2014    Overnight/24hr events:  Came off Mg last night  Subjective:  No s/s of pre-eclampsia. Meeting all post op goals.   Objective:    Current Vital Signs 24h Vital Sign Ranges  T 98.2 F (36.8 C) Temp  Avg: 98.3 F (36.8 C)  Min: 97.8 F (36.6 C)  Max: 98.7 F (37.1 C)  BP 139/78 BP  Min: 134/73  Max: 146/74  HR 81 Pulse  Avg: 71.6  Min: 65  Max: 81  RR 18 Resp  Avg: 17.5  Min: 16  Max: 20  SaO2 99 % Room Air SpO2  Avg: 99.5 %  Min: 98 %  Max: 100 %       24 Hour I/O Current Shift I/O  Time Ins Outs 07/25 0701 - 07/26 0700 In: 2856.5 [P.O.:1540; I.V.:1316.5] Out: 4475 [Urine:4475] No intake/output data recorded.   Patient Vitals for the past 24 hrs:  BP Temp Temp src Pulse Resp SpO2 Height Weight  03/21/18 0821 139/78 98.2 F (36.8 C) Oral 81 18 99 % - -  03/21/18 0416 134/73 98.5 F (36.9 C) Oral 73 17 100 % - -  03/21/18 0041 138/63 98.3 F (36.8 C) Oral 65 18 100 % - -  03/20/18 2113 (!) 144/79 97.8 F (36.6 C) Oral 69 18 100 % 5\' 10"  (1.778 m) 258 lb (117 kg)  03/20/18 1639 (!) 146/74 - - 75 16 98 % - -  03/20/18  1638 - 98.3 F (36.8 C) Oral - - - - -  03/20/18 1600 - - - - 17 - - -  03/20/18 1450 - - - - 16 - - -  03/20/18 1350 - - - - 17 - - -  03/20/18 1245 - - - - 17 - - -  03/20/18 1144 137/70 98.7 F (37.1 C) Oral 68 18 100 % - -  03/20/18 1041 (!) 144/73 - - 70 - - - -  03/20/18 1040 - - - - 20 - - -    Physical exam: General: Well nourished, well developed female in no acute distress. Abdomen: nttp, mildly distended, +BS. C/d/i dressing Cardiovascular: S1, S2 normal, no murmur, rub or gallop, regular rate and rhythm Respiratory: CTAB Extremities: no clubbing, cyanosis or edema Skin: Warm and dry.   Medications: Current Facility-Administered Medications  Medication Dose Route Frequency Provider Last Rate Last Dose  . acetaminophen (TYLENOL) tablet 650 mg  650 mg Oral Q4H PRN Federico FlakeNewton, Kimberly Niles, MD      . acetaminophen (TYLENOL) tablet 650 mg  650 mg Oral Q4H PRN Lazaro ArmsEure, Luther H, MD      . coconut oil  1 application Topical PRN Lazaro ArmsEure, Luther H, MD   1 application at 03/20/18 1754  . witch hazel-glycerin (TUCKS)  pad 1 application  1 application Topical PRN Lazaro Arms, MD       And  . dibucaine (NUPERCAINAL) 1 % rectal ointment 1 application  1 application Rectal PRN Lazaro Arms, MD      . diphenhydrAMINE (BENADRYL) capsule 25 mg  25 mg Oral Q6H PRN Lazaro Arms, MD      . enoxaparin (LOVENOX) injection 65 mg  0.5 mg/kg Subcutaneous Q24H Lazaro Arms, MD   65 mg at 03/21/18 0919  . ibuprofen (ADVIL,MOTRIN) tablet 600 mg  600 mg Oral Q6H Lazaro Arms, MD   600 mg at 03/21/18 0525  . labetalol (NORMODYNE) tablet 200 mg  200 mg Oral BID Federico Flake, MD   200 mg at 03/21/18 0917  . lactated ringers infusion 500-1,000 mL  500-1,000 mL Intravenous PRN Federico Flake, MD      . lidocaine (PF) (XYLOCAINE) 1 % injection 30 mL  30 mL Subcutaneous PRN Federico Flake, MD      . menthol-cetylpyridinium (CEPACOL) lozenge 3 mg  1 lozenge Oral Q2H PRN Lazaro Arms, MD      . naloxone [facility]) injection 0.4 mg  0.4 mg Intravenous PRN Hodierne, Madelaine Bhat, MD       And  . sodium chloride flush (NS) 0.9 % injection 3 mL  3 mL Intravenous PRN Hodierne, Adam, MD      . ondansetron (ZOFRAN) injection 4 mg  4 mg Intravenous Q8H PRN Hodierne, Adam, MD      . oxyCODONE-acetaminophen (PERCOCET/ROXICET) 5-325 MG per tablet 1 tablet  1 tablet Oral Q4H PRN Federico Flake, MD   1 tablet at 03/20/18 2306  . oxyCODONE-acetaminophen (PERCOCET/ROXICET) 5-325 MG per tablet 2 tablet  2 tablet Oral Q4H PRN Federico Flake, MD      . oxyCODONE-acetaminophen (PERCOCET/ROXICET) 5-325 MG per tablet 2 tablet  2 tablet Oral Q4H PRN Lazaro Arms, MD   2 tablet at 03/21/18 [phone]  . oxytocin (PITOCIN) IV BOLUS FROM BAG  500 mL Intravenous Once Federico Flake, MD      . oxytocin (PITOCIN) IV infusion 40 units in LR 1000 mL - Premix  2.5 Units/hr Intravenous Continuous Federico Flake, MD      . prenatal multivitamin tablet 1 tablet  1 tablet Oral Q1200 Lazaro Arms, MD   1 tablet at 03/20/18 1150  . scopolamine (TRANSDERM-SCOP) 1 MG/3DAYS 1.5 mg  1 patch Transdermal Once Trevor Iha, MD   1.5 mg at 03/19/18 1704  . senna-docusate (Senokot-S) tablet 2 tablet  2 tablet Oral Q24H Lazaro Arms, MD   2 tablet at 03/20/18 2306  . simethicone (MYLICON) chewable tablet 80 mg  80 mg Oral TID PC Lazaro Arms, MD   80 mg at 03/21/18 0917  . sucralfate (CARAFATE) tablet 1 g  1 g Oral BID PRN Ariton Bing, MD        Labs:  Recent Labs  Lab 03/19/18 1118 03/20/18 0550 03/21/18 0550  WBC 6.7 17.4* 14.0*  HGB 9.6* 8.3* 7.0*  HCT 30.0* 25.4* 21.7*  PLT 292 322 250    Recent Labs  Lab 03/19/18 1118  NA 135  K 4.0  CL 106  CO2 20*  BUN 10  CREATININE 0.74  CALCIUM 8.4*  PROT 6.5  BILITOT 0.4  ALKPHOS 162*  ALT 29  AST 37  GLUCOSE 98     Radiology: no new imaging  Assessment & Plan:  Pt  doing well *PP: routine care. O POS.  Unsure on [facility]. Breast *CV: continue labetalol. Has bp and incision check for next week. *PPx: lovenox, oob *FEN/GI: regular diet, sliv *Dispo: likely tomorrow  Cornelia Copa. MD Attending Center for [facility])

## 2018-03-21 NOTE — Lactation Note (Signed)
This note was copied from a baby's chart. Lactation Consultation Note  Patient Name: Colleen ColletBoyB Sayde Terrell ZOXWR'UToday's Date: 03/21/2018 Reason for consult: Follow-up assessment;Multiple gestation;Late-preterm 34-36.6wks;Infant < 6lbs Babies are 3942 hours old.  Mom states that baby girl is breastfeeding well.  She just finished a feeding at breast for 30 minutes followed by formula supplementation.  Baby boy breastfed for 10 minutes and then he took 25 mls of formula.  He is still crying and cueing.  Recommended she offer more formula.  Mom is pumping 4 times per day and obtaining small amounts of colostrum.  Instructed to feed with cues and call for assist prn.  Maternal Data    Feeding    LATCH Score                   Interventions    Lactation Tools Discussed/Used     Consult Status Consult Status: Follow-up Date: 03/22/18 Follow-up type: In-patient    Huston FoleyMOULDEN, Janelly Switalski S 03/21/2018, 12:30 PM

## 2018-03-22 MED ORDER — OXYCODONE-ACETAMINOPHEN 5-325 MG PO TABS: 1.0000 | ORAL_TABLET | ORAL | 0 refills | Status: DC | PRN

## 2018-03-22 MED ORDER — IBUPROFEN 600 MG PO TABS: 600.0000 mg | ORAL_TABLET | Freq: Four times a day (QID) | ORAL | 0 refills | Status: DC

## 2018-03-22 NOTE — Discharge Summary (Signed)
OB Discharge Summary     Patient Name: Colleen Terrell DOB: 03/05/1981 MRN: [id number]  Date of admission: 03/19/2018 Delivering MD:    Dereck Leep Girl Neah [[id number]]  Diera, Wirkkala [[id number]]  Duane Lope H   Date of discharge: 03/22/2018  Admitting diagnosis: 36WKS, HIGH BP Intrauterine pregnancy: [redacted]w[redacted]d     Secondary diagnosis:  Active Problems:   S/P cesarean section   Preeclampsia  Additional problems: CHTN, twins, malpresentation     Discharge diagnosis: S/P LTCS                                                                                                Post partum procedures:C section, magnesium x 24 hrs  Augmentation: NA  Complications: None  Hospital course:  Pt was admitted with twin gestation, CHTN with SIPEC. Underwent c section d/ malpresentation. See OP note for additional information. Post partum course was unremarkable. Received magnesium x 24 hrs. Labetalol for BP control. Progressed to ambulating, voiding, tolerating diet and good oral pain control. Felt amendable for discharge home on POD # 3.  Discharge medications, instructions and follow up reviewed with pt. Pt verbalized understanding.   Physical exam  Vitals:   03/21/18 2000 03/21/18 2340 03/22/18 0529 03/22/18 0758  BP: (!) 147/85 (!) 146/82 (!) 145/79 (!) 156/97  Pulse: 81 73 76 86  Resp: 14 18 18 18   Temp: 99 F (37.2 C) 98.9 F (37.2 C) 98.8 F (37.1 C) (!) 97.5 F (36.4 C)  TempSrc: Oral Oral Oral Oral  SpO2: 100% 99% 100% 100%  Weight:      Height:       General: alert Lochia: appropriate Uterine Fundus: firm Incision: Healing well with no significant drainage DVT Evaluation: No evidence of DVT seen on physical exam. Labs: Lab Results  Component Value Date   WBC 14.0 (H) 03/21/2018   HGB 7.0 (L) 03/21/2018   HCT 21.7 (L) 03/21/2018   MCV 80.1 03/21/2018   PLT 250 03/21/2018   CMP Latest Ref Rng & Units 03/19/2018   Glucose 70 - 99 mg/dL 98  BUN 6 - 20 mg/dL 10  Creatinine 0.86 - 5.78 mg/dL 4.69  Sodium 629 - 528 mmol/L 135  Potassium 3.5 - 5.1 mmol/L 4.0  Chloride 98 - 111 mmol/L 106  CO2 22 - 32 mmol/L 20(L)  Calcium 8.9 - 10.3 mg/dL 4.1(L)  Total Protein 6.5 - 8.1 g/dL 6.5  Total Bilirubin 0.3 - 1.2 mg/dL 0.4  Alkaline Phos 38 - 126 U/L 162(H)  AST 15 - 41 U/L 37  ALT 0 - 44 U/L 29    Discharge instruction: per After Visit Summary and "Baby and Me Booklet".  After visit meds:  Allergies as of 03/22/2018   No Known Allergies     Medication List    STOP taking these medications   aspirin 81 MG chewable tablet   sucralfate 1 g tablet Commonly known as:  CARAFATE     TAKE these medications   calcium carbonate 500 MG chewable tablet Commonly known as:  TUMS - dosed in mg  elemental calcium Chew 2 tablets by mouth daily as needed for indigestion or heartburn.   ibuprofen 600 MG tablet Commonly known as:  ADVIL,MOTRIN Take 1 tablet (600 mg total) by mouth every 6 (six) hours.   labetalol 200 MG tablet Commonly known as:  NORMODYNE Take 1 tablet (200 mg total) by mouth 2 (two) times daily.   oxyCODONE-acetaminophen 5-325 MG tablet Commonly known as:  PERCOCET/ROXICET Take 1 tablet by mouth every 4 (four) hours as needed (for pain scale greater than or equal to 4 and less than 7).   Prenatal Vitamins 0.8 MG tablet Take 1 tablet by mouth daily.            Discharge Care Instructions  (From admission, onward)        Start     Ordered   03/22/18 0000  Discharge wound care:    Comments:  Remove dressing in 5 days.   03/22/18 0937      Diet: routine diet  Activity: Advance as tolerated. Pelvic rest for 6 weeks.   Outpatient follow up:1 week for BP and incision check, 4 weeks for PP visit Follow up Appt: Future Appointments  Date Time Provider Department Center  03/27/2018 10:00 AM [facility] NURSE [facility]  04/24/2018 10:55 AM [name], Annika Selke L, MD [facility]    Follow up Visit:No follow-ups on file.  Postpartum contraception: Undecided  Newborn Data:   Morton PetersOumarou-Sidibe, Girl Zaila [[id number]][[id number]]  Live born female  Birth Weight: 5 lb 13.7 oz (2655 g) APGAR: 7, 8  Newborn Delivery   Birth date/time:  03/19/2018 18:26:00 Delivery type:  C-Section, Low Transverse Trial of labor:  No C-section categorization:  Repeat      Franco ColletOumarou-Sidibe, BoyB Danyale [[id number]][[id number]]  Live born female  Birth Weight: 6 lb 4.2 oz (2840 g) APGAR: 8, 9  Newborn Delivery   Birth date/time:  03/19/2018 18:28:00 Delivery type:  C-Section, Low Transverse Trial of labor:  No C-section categorization:  Repeat     Baby Feeding: Breast Disposition:home with mother   03/22/2018 Hermina StaggersMichael L Lamont Tant, MD

## 2018-03-22 NOTE — Lactation Note (Signed)
This note was copied from a baby's chart. Lactation Consultation Note  Patient Name: Girl Laverle HobbyMariama Oumarou-Sidibe EAVWU'JToday's Date: 03/22/2018 Reason for consult: Follow-up assessment;Infant < 6lbs;Late-preterm 34-36.6wks;Multiple gestation  P6 mother whose twins are now 3165 hours old.  These are LPTI at 36+6 weeks weighing <6 lbs  Mother is ready for discharge.  Babies are in their bassinets resting quietly.    Per mother, she has been feeding more from the breasts since 0200.  She feels like her milk is coming in today.  Mother will continue to feed 8-12 times/24 hours or sooner if the babies show feeding cues.  She will supplement as needed.  Engorgement prevention/treatment reviewed with mother.  She has no further questions/concerns at this time.  [facility].  She does not have a DEBP and is not sure if she will need one but the referral will be sent anyway.  Encouraged to call for any questions/concerns once she gets home.  Mother verbalized understanding.  Father present.   Maternal Data Formula Feeding for Exclusion: No Has patient been taught Hand Expression?: Yes Does the patient have breastfeeding experience prior to this delivery?: Yes  Feeding Feeding Type: Breast Fed Length of feed: 20 min  LATCH Score                   Interventions    Lactation Tools Discussed/Used WIC Program: Yes Pump Review: Setup, frequency, and cleaning Initiated by:: Niall Illes Date initiated:: 03/22/18   Consult Status Consult Status: Complete Date: 03/22/18 Follow-up type: Call as needed    Estil Vallee R Ozro Russett 03/22/2018, 11:57 AM

## 2018-03-22 NOTE — Discharge Instructions (Signed)

## 2018-03-27 ENCOUNTER — Ambulatory Visit (INDEPENDENT_AMBULATORY_CARE_PROVIDER_SITE_OTHER): Payer: Medicaid Other

## 2018-03-27 ENCOUNTER — Inpatient Hospital Stay (HOSPITAL_COMMUNITY)
Admission: AD | Admit: 2018-03-27 | Discharge: 2018-03-27 | Disposition: A | Discharge status: Home / Self Care | Payer: Medicaid Other | Source: Ambulatory Visit | Attending: Family Medicine | Admitting: Family Medicine

## 2018-03-27 ENCOUNTER — Encounter (HOSPITAL_COMMUNITY): Payer: Self-pay | Admitting: *Deleted

## 2018-03-27 VITALS — BP 190/110 | HR 86 | Wt 261.1 lb

## 2018-03-27 DIAGNOSIS — O1415 Severe pre-eclampsia, complicating the puerperium: Secondary | ICD-10-CM

## 2018-03-27 DIAGNOSIS — Z5189 Encounter for other specified aftercare: Secondary | ICD-10-CM

## 2018-03-27 DIAGNOSIS — R030 Elevated blood-pressure reading, without diagnosis of hypertension: Secondary | ICD-10-CM | POA: Diagnosis present

## 2018-03-27 DIAGNOSIS — I10 Essential (primary) hypertension: Secondary | ICD-10-CM

## 2018-03-27 DIAGNOSIS — O1003 Pre-existing essential hypertension complicating the puerperium: Secondary | ICD-10-CM | POA: Diagnosis not present

## 2018-03-27 DIAGNOSIS — Z0130 Encounter for examination of blood pressure without abnormal findings: Secondary | ICD-10-CM

## 2018-03-27 LAB — COMPREHENSIVE METABOLIC PANEL
ALK PHOS: 91 U/L (ref 38–126)
ALT: 54 U/L — AB (ref 0–44)
AST: 51 U/L — ABNORMAL HIGH (ref 15–41)
Albumin: 2.8 g/dL — ABNORMAL LOW (ref 3.5–5.0)
Anion gap: 11 (ref 5–15)
BUN: 13 mg/dL (ref 6–20)
CALCIUM: 8.4 mg/dL — AB (ref 8.9–10.3)
CO2: 20 mmol/L — AB (ref 22–32)
CREATININE: 0.78 mg/dL (ref 0.44–1.00)
Chloride: 106 mmol/L (ref 98–111)
Glucose, Bld: 82 mg/dL (ref 70–99)
Potassium: 4.8 mmol/L (ref 3.5–5.1)
Sodium: 137 mmol/L (ref 135–145)
TOTAL PROTEIN: 6.1 g/dL — AB (ref 6.5–8.1)
Total Bilirubin: 0.4 mg/dL (ref 0.3–1.2)

## 2018-03-27 LAB — CBC
HCT: 21.1 % — ABNORMAL LOW (ref 36.0–46.0)
HEMOGLOBIN: 6.7 g/dL — AB (ref 12.0–15.0)
MCH: 25.9 pg — AB (ref 26.0–34.0)
MCHC: 31.8 g/dL (ref 30.0–36.0)
MCV: 81.5 fL (ref 78.0–100.0)
Platelets: 398 10*3/uL (ref 150–400)
RBC: 2.59 MIL/uL — AB (ref 3.87–5.11)
RDW: 15.7 % — ABNORMAL HIGH (ref 11.5–15.5)
WBC: 10 10*3/uL (ref 4.0–10.5)

## 2018-03-27 LAB — PROTEIN / CREATININE RATIO, URINE
Creatinine, Urine: 143 mg/dL
Protein Creatinine Ratio: 0.14 mg/mg{Cre} (ref 0.00–0.15)
Total Protein, Urine: 20 mg/dL

## 2018-03-27 MED ORDER — LABETALOL HCL 5 MG/ML IV SOLN: 20.0000 mg | INTRAVENOUS | Status: DC | PRN

## 2018-03-27 MED ORDER — FERROUS SULFATE 325 (65 FE) MG PO TABS: 325.0000 mg | ORAL_TABLET | Freq: Two times a day (BID) | ORAL | 3 refills | Status: DC

## 2018-03-27 MED ORDER — HYDRALAZINE HCL 20 MG/ML IJ SOLN
10.0000 mg | INTRAMUSCULAR | Status: DC | PRN
  Administered 2018-03-27: 10 mg via INTRAVENOUS
  Filled 2018-03-27: qty 1

## 2018-03-27 MED ORDER — NIFEDIPINE 10 MG PO CAPS: 20.0000 mg | ORAL_CAPSULE | ORAL | Status: DC | PRN

## 2018-03-27 MED ORDER — BUTALBITAL-APAP-CAFFEINE 50-325-40 MG PO TABS
2.0000 | ORAL_TABLET | Freq: Once | ORAL | Status: AC
  Administered 2018-03-27: 2 via ORAL
  Filled 2018-03-27: qty 2

## 2018-03-27 MED ORDER — HYDRALAZINE HCL 20 MG/ML IJ SOLN
5.0000 mg | INTRAMUSCULAR | Status: DC | PRN
  Administered 2018-03-27: 5 mg via INTRAVENOUS
  Filled 2018-03-27: qty 1

## 2018-03-27 MED ORDER — NIFEDIPINE 10 MG PO CAPS
10.0000 mg | ORAL_CAPSULE | ORAL | Status: DC | PRN
  Administered 2018-03-27: 10 mg via ORAL
  Filled 2018-03-27: qty 1

## 2018-03-27 MED ORDER — LABETALOL HCL 5 MG/ML IV SOLN: 40.0000 mg | INTRAVENOUS | Status: DC | PRN

## 2018-03-27 MED ORDER — ENALAPRIL-HYDROCHLOROTHIAZIDE 10-25 MG PO TABS: 1.0000 | ORAL_TABLET | Freq: Every day | ORAL | 1 refills | Status: DC

## 2018-03-27 MED ORDER — POLYETHYLENE GLYCOL 3350 17 G PO PACK: 17.0000 g | PACK | Freq: Every day | ORAL | 0 refills | Status: DC

## 2018-03-27 MED ORDER — SODIUM CHLORIDE 0.9 % IV SOLN
510.0000 mg | Freq: Once | INTRAVENOUS | Status: AC
  Administered 2018-03-27: 510 mg via INTRAVENOUS
  Filled 2018-03-27: qty 17

## 2018-03-27 MED ORDER — DOCUSATE SODIUM 100 MG PO CAPS: 100.0000 mg | ORAL_CAPSULE | Freq: Two times a day (BID) | ORAL | 0 refills | Status: DC

## 2018-03-27 NOTE — Discharge Instructions (Signed)
-return to [facility] on Monday, Aug 5 for repeat blood pressure check in the morning.  -take BP medicine every day in the morning; take iron twice a day, plus colace twice a day, and miralax once a day.   Postpartum Hypertension Postpartum hypertension is high blood pressure after pregnancy that remains higher than normal for more than two days after delivery. You may not realize that you have postpartum hypertension if your blood pressure is not being checked regularly. In some cases, postpartum hypertension will go away on its own, usually within a week of delivery. However, for some women, medical treatment is required to prevent serious complications, such as seizures or stroke. The following things can affect your blood pressure:  The type of delivery you had.  Having received IV fluids or other medicines during or after delivery.  What are the causes? Postpartum hypertension may be caused by any of the following or by a combination of any of the following:  Hypertension that existed before pregnancy (chronic hypertension).  Gestational hypertension.  Preeclampsia or eclampsia.  Receiving a lot of fluid through an IV during or after delivery.  Medicines.  HELLP syndrome.  Hyperthyroidism.  Stroke.  Other rare neurological or blood disorders.  In some cases, the cause may not be known. What increases the risk? Postpartum hypertension can be related to one or more risk factors, such as:  Chronic hypertension. In some cases, this may not have been diagnosed before pregnancy.  Obesity.  Type 2 diabetes.  Kidney disease.  Family history of preeclampsia.  Other medical conditions that cause hormonal imbalances.  What are the signs or symptoms? As with all types of hypertension, postpartum hypertension may not have any symptoms. Depending on how high your blood pressure is, you may experience:  Headaches. These may be mild, moderate, or severe. They may also be steady,  constant, or sudden in onset (thunderclap headache).  Visual changes.  Dizziness.  Shortness of breath.  Swelling of your hands, feet, lower legs, or face. In some cases, you may have swelling in more than one of these locations.  Heart palpitations or a racing heartbeat.  Difficulty breathing while lying down.  Decreased urination.  Other rare signs and symptoms may include:  Sweating more than usual. This lasts longer than a few days after delivery.  Chest pain.  Sudden dizziness when you get up from sitting or lying down.  Seizures.  Nausea or vomiting.  Abdominal pain.  How is this diagnosed? The diagnosis of postpartum hypertension is made through a combination of physical examination findings and testing of your blood and urine. You may also have additional tests, such as a CT scan or an MRI, to check for other complications of postpartum hypertension. How is this treated? When blood pressure is high enough to require treatment, your options may include:  Medicines to reduce blood pressure (antihypertensives). Tell your health care provider if you are breastfeeding or if you plan to breastfeed. There are many antihypertensive medicines that are safe to take while breastfeeding.  Stopping medicines that may be causing hypertension.  Treating medical conditions that are causing hypertension.  Treating the complications of hypertension, such as seizures, stroke, or kidney problems.  Your health care provider will also continue to monitor your blood pressure closely and repeatedly until it is within a safe range for you. Follow these instructions at home:  Take medicines only as directed by your health care provider.  Get regular exercise after your health care provider tells  you that it is safe.  Follow your health care providers recommendations on fluid and salt restrictions.  Do not use any tobacco products, including cigarettes, chewing tobacco, or  electronic cigarettes. If you need help quitting, ask your health care provider.  Keep all follow-up visits as directed by your health care provider. This is important. Contact a health care provider if:  Your symptoms get worse.  You have new symptoms, such as: ? Headache. ? Dizziness. ? Visual changes. Get help right away if:  You develop a severe or sudden headache.  You have seizures.  You develop numbness or weakness on one side of your body.  You have difficulty thinking, speaking, or swallowing.  You develop severe abdominal pain.  You develop difficulty breathing, chest pain, a racing heartbeat, or heart palpitations. These symptoms may represent a serious problem that is an emergency. Do not wait to see if the symptoms will go away. Get medical help right away. Call your local emergency services (911 in the [place] Do not drive yourself to the hospital. This information is not intended to replace advice given to you by your health care provider. Make sure you discuss any questions you have with your health care provider. Document Released: 04/16/2014 Document Revised: 01/16/2016 Document Reviewed: 02/25/2014 Elsevier Interactive Patient Education  [employer]2018 [employer]

## 2018-03-27 NOTE — MAU Note (Signed)
Pt sent up from clinic for elevated B/P's. No headache but stated she has dizziness and nausea. Post partum 7/24 c-section twins.

## 2018-03-27 NOTE — MAU Note (Signed)
CRITICAL VALUE ALERT  Critical Value:  Hgb 6.7  Date & Time Notied:  12:40  Provider Notified: K.Kooistra,CNM  Orders Received/Actions taken: cont to monitor

## 2018-03-27 NOTE — Progress Notes (Signed)
Pt here today for incision check s/p c-section on 03/19/18 and BP check.  Pt reports feeling very tired and when she gets out of bed she has grab something.  Pt states that she is taking Labetolol po bid as prescribed but has been feeling fatigued.  Notified Dr. Debroah LoopArnold, provider recommendation to have pt go to MAU for evaluation.   Pt's incision healing well.  No odor, drainage, and well approximated.  Pt has old glue remaining on incision.  I advised pt to continue to have warm soapy water to run over incision please do not cover it, and to pat dry.  Dr. Debroah LoopArnold removed stitch that remained.  Pt stated understanding to the recommendations and I informed her pp visit is 04/24/18.  Pt stated thank you and walked to MAU.

## 2018-03-27 NOTE — MAU Note (Cosign Needed Addendum)
[facility]ulty [facility] OB/[facility]N Attending MAU Note  Chief Complaint: Hypertension    None     SUBJECTIVE Colleen Terrell is a 37 y.o. [id number]  who presents with elevated blood pressures at 1W post partum visit.  Pt has been experiencing dizziness with exertion since being dicharged on 22 March 2018.  Pt reports symptoms of feeling chills followed by fever sporadically since yesterday evening.  Pt endorses eating 3 meals and ingesting approx 100ccs of water.     Past Medical History:  Diagnosis Date  . Complication of anesthesia    pain with surgery  . GERD (gastroesophageal reflux disease)   . History of kidney infection during pregnancy   . Hypertension   . PP NVB 02/02/12 02/02/2012  . Unspecified hemorrhoids without mention of complication    OB History  Gravida Para Term Preterm AB Living  '4 4 3 1   6  ' SAB TAB Ectopic Multiple Live Births        2 6    # Outcome Date GA Lbr Len/2nd Weight Sex Delivery Anes PTL Lv  4A Preterm 03/19/18 [redacted]w[redacted]d 5 lb 13.7 oz (2.655 kg) F CS-LTranv Spinal  LIV  4B Preterm 03/19/18 381w6d6 lb 4.2 oz (2.84 kg) M CS-LTranv Spinal  LIV  3A Term 09/13/14 3739w1d lb 3 oz (3.26 kg) M CS-LTranv Spinal  LIV  3B Term 09/13/14 37w81w1dlb 7.7 oz (2.94 kg) F CS-LTranv Spinal  LIV  2 Term 02/02/12 41w153w1d0 / 00:23 9 lb 1.5 oz (4.125 kg) F Vag-Spont Local  LIV  1 Term 10/30/06 6w0d68w0d 9 lb 3 oz (4.167 kg) M Vag-Spont EPI  LIV   Past Surgical History:  Procedure Laterality Date  . CESAREAN SECTION N/A 09/13/2014   Procedure: CESAREAN SECTION;  Surgeon: Jacob Truett [place] [facility] [place] Service: [facility]s;  Laterality: N/A;  . CESAREAN SECTION MULTI-GESTATIONAL N/A 03/19/2018   Procedure: REPEAT CESAREAN SECTION MULTI-GESTATIONAL;  Surgeon: NewtonCaren Macadam [facility];  Laterality: N/A;   Social History   Socioeconomic History  . Marital status: Married    Spouse name: Not on file  . Number of  children: Not on file  . Years of education: Not on file  . Highest education level: Not on file  Occupational History  . Not on file  Social Needs  . Financial resource strain: Not on file  . Food insecurity:    Worry: Not on file    Inability: Not on file  . Transportation needs:    Medical: Not on file    Non-medical: Not on file  Tobacco Use  . Smoking status: Never Smoker  . Smokeless tobacco: Never Used  Substance and Sexual Activity  . Alcohol use: No  . Drug use: No  . Sexual activity: Yes    Birth control/protection: None  Lifestyle  . Physical activity:    Days per week: Not on file    Minutes per session: Not on file  . Stress: Not on file  Relationships  . Social connections:    Talks on phone: Not on file    Gets together: Not on file    Attends religious service: Not on file    Active member of club or organization: Not on file    Attends meetings of clubs or organizations: Not on file    Relationship status: Not on file  . Intimate partner violence:  Fear of current or ex partner: Not on file    Emotionally abused: Not on file    Physically abused: Not on file    Forced sexual activity: Not on file  Other Topics Concern  . Not on file  Social History Narrative  . Not on file   No current facility-administered medications on file prior to encounter.    Current Outpatient Medications on File Prior to Encounter  Medication Sig Dispense Refill  . calcium carbonate (TUMS - DOSED IN MG ELEMENTAL CALCIUM) 500 MG chewable tablet Chew 2 tablets by mouth daily as needed for indigestion or heartburn.    Marland Kitchen ibuprofen (ADVIL,MOTRIN) 600 MG tablet Take 1 tablet (600 mg total) by mouth every 6 (six) hours. 30 tablet 0  . labetalol (NORMODYNE) 200 MG tablet Take 1 tablet (200 mg total) by mouth 2 (two) times daily. 60 tablet 3  . oxyCODONE-acetaminophen (PERCOCET/ROXICET) 5-325 MG tablet Take 1 tablet by mouth every 4 (four) hours as needed (for pain scale greater  than or equal to 4 and less than 7). 24 tablet 0  . Prenatal Multivit-Min-Fe-FA (PRENATAL VITAMINS) 0.8 MG tablet Take 1 tablet by mouth daily. 30 tablet 12   No Known Allergies  ROS: Pertinent items in HPI  OBJECTIVE Patient Vitals for the past 24 hrs:  BP Temp Temp src Pulse Resp Height Weight  03/27/18 1430 (!) 144/90 - - (!) 112 - - -  03/27/18 1416 133/79 - - (!) 102 - - -  03/27/18 1401 (!) 148/82 - - (!) 110 - - -  03/27/18 1346 (!) 168/85 - - (!) 118 - - -  03/27/18 1333 (!) 174/88 - - (!) 111 - - -  03/27/18 1313 (!) 163/106 - - - - - -  03/27/18 1300 (!) 163/106 - - - - - -  03/27/18 1246 (!) 158/95 - - - - - -  03/27/18 1242 (!) 166/100 - - - - - -  03/27/18 1148 (!) 169/108 98.2 F (36.8 C) Oral 85 18 '5\' 10"'  (1.778 m) 263 lb (119.3 kg)    CONSTITUTIONAL: Well-developed, well-nourished female in no acute distress.  HENT:  Normocephalic, atraumatic,  EYES:  EOM are normal.  NECK: Normal range of motion,  SKIN: Skin is warm and dry. No rash noted. Not diaphoretic. No erythema. No pallor. [place]: Alert and oriented PSYCHIATRIC: Normal mood and affect. Normal behavior. Normal judgment and thought content. CARDIOVASCULAR: Normal heart rate noted RESPIRATORY: Effort and breath sounds normal, no problems with respiration noted. ABDOMEN: tenderness in RLQ and LLQ just below the umbilicus; no  rebound or guarding.  MUSCULOSKELETAL: Normal range of motion. No tenderness, or  edema.    LAB RESULTS Results for orders placed or performed during the hospital encounter of 03/27/18 (from the past 48 hour(s))  Protein / creatinine ratio, urine     Status: None   Collection Time: 03/27/18 12:22 PM  Result Value Ref Range   Creatinine, Urine 143.00 mg/dL   Total Protein, Urine 20 mg/dL    Comment: NO NORMAL RANGE ESTABLISHED FOR THIS TEST   Protein Creatinine Ratio 0.14 0.00 - 0.15 mg/mg[Cre]    Comment: Performed at [facility], [address], [place], [place] [address]   CBC     Status: Abnormal   Collection Time: 03/27/18 12:23 PM  Result Value Ref Range   WBC 10.0 4.0 - 10.5 K/uL   RBC 2.59 (L) 3.87 - 5.11 MIL/uL   Hemoglobin 6.7 (LL) 12.0 - 15.0  g/dL    Comment: CRITICAL RESULT CALLED TO, READ BACK BY AND VERIFIED WITH: WILSON,C '@1241'  ON [id number] BY FLEMINGS    HCT 21.1 (L) 36.0 - 46.0 %   MCV 81.5 78.0 - 100.0 fL   MCH 25.9 (L) 26.0 - 34.0 pg   MCHC 31.8 30.0 - 36.0 g/dL   RDW 15.7 (H) 11.5 - 15.5 %   Platelets 398 150 - 400 K/uL    Comment: Performed at [facility], [address], [place], [place] [address]  Comprehensive metabolic panel     Status: Abnormal   Collection Time: 03/27/18 12:23 PM  Result Value Ref Range   Sodium 137 135 - 145 mmol/L   Potassium 4.8 3.5 - 5.1 mmol/L   Chloride 106 98 - 111 mmol/L   CO2 20 (L) 22 - 32 mmol/L   Glucose, Bld 82 70 - 99 mg/dL   BUN 13 6 - 20 mg/dL   Creatinine, Ser 0.78 0.44 - 1.00 mg/dL   Calcium 8.4 (L) 8.9 - 10.3 mg/dL   Total Protein 6.1 (L) 6.5 - 8.1 g/dL   Albumin 2.8 (L) 3.5 - 5.0 g/dL   AST 51 (H) 15 - 41 U/L   ALT 54 (H) 0 - 44 U/L   Alkaline Phosphatase 91 38 - 126 U/L   Total Bilirubin 0.4 0.3 - 1.2 mg/dL   GFR calc non Af Amer >60 >60 mL/min   GFR calc Af Amer >60 >60 mL/min    Comment: (NOTE) The eGFR has been calculated using the CKD EPI equation. This calculation has not been validated in all clinical situations. eGFR's persistently <60 mL/min signify possible Chronic Kidney Disease.    Anion gap 11 5 - 15    Comment: Performed at [facility], [place], [place] [address]     MAU COURSE Anemic (Hgb 6.7, previously 7.0 on 21 March 2018; 8.3 on 25 July) 37yo  [id number] non-gravid, POD8 with systolic pressures in severe range   Hydralazine 85m, per IV 12:30pm Hydralazine 156m per IV 12:37pm Nefedipine 108mper PO 1:13pm   Administration of Iron Transfusion 1:45pm  Pt  complaining of headache '@3' :15pm,  10/1, pain resolved with  Fioricet   ASSESSMENT Symptomatically anemic (dizziness) hypertensive 36yo  [id number] repeat c/s for twins, w. pmh significant for c/s x 2 for twins, and chronic htn w. superimposed pre-e has blood pressure responsive to administration of Nefedipine  PLAN Continue to repeat BP readings after administration of anti-hypertensive and fiorcet; once stabilized discharge with prescription of [id number]      CNM attestation:  I have seen and examined this patient and agree with above documentation in the medical student's note.   Colleen Terrell a [age]o. [id number] C/section  +FM, denies LOF, VB, contractions, vaginal discharge.  PE: Patient Vitals for the past 24 hrs:  BP Temp Temp src Pulse Resp Height Weight  03/27/18 1430 (!) 144/90 - - (!) 112 - - -  03/27/18 1416 133/79 - - (!) 102 - - -  03/27/18 1401 (!) 148/82 - - (!) 110 - - -  03/27/18 1346 (!) 168/85 - - (!) 118 - - -  03/27/18 1333 (!) 174/88 - - (!) 111 - - -  03/27/18 1313 (!) 163/106 - - - - - -  03/27/18 1300 (!) 163/106 - - - - - -  03/27/18 1246 (!) 158/95 - - - - - -  03/27/18 1242 (!)Marland Kitchen  166/100 - - - - - -  03/27/18 1148 (!) 169/108 98.2 F (36.8 C) Oral 85 18 '5\' 10"'  (1.778 m) 263 lb (119.3 kg)   Gen: calm comfortable, NAD Resp: normal effort, no distress Heart: Regular rate Abd: Soft, NT,   ROS, labs, PMH reviewed  Orders Placed This Encounter  Procedures  . Protein / creatinine ratio, urine  . CBC  . Comprehensive metabolic panel  . Vital signs  . Measure blood pressure  . Measure blood pressure  . Discharge patient Discharge disposition: 01-Home or Self Care; Discharge patient date: 03/27/2018  . Discharge patient Discharge disposition: 01-Home or Self Care; Discharge patient date: 03/27/2018  . Discharge patient Discharge disposition: 01-Home or Self Care; Discharge patient date: 03/27/2018   Meds ordered this encounter  Medications  . AND Linked Order Group   .  hydrALAZINE (APRESOLINE) injection 5 mg   . hydrALAZINE (APRESOLINE) injection 10 mg   . labetalol (NORMODYNE,TRANDATE) injection 20 mg   . labetalol (NORMODYNE,TRANDATE) injection 40 mg  . AND Linked Order Group   . NIFEdipine (PROCARDIA) capsule 10 mg   . NIFEdipine (PROCARDIA) capsule 20 mg   . NIFEdipine (PROCARDIA) capsule 20 mg   . labetalol (NORMODYNE,TRANDATE) injection 40 mg  . ferumoxytol (FERAHEME) 510 mg in sodium chloride 0.9 % 100 mL IVPB  . butalbital-acetaminophen-caffeine (FIORICET, ESGIC) 50-325-40 MG per tablet 2 tablet  . enalapril-hydrochlorothiazide (VASERETIC) 10-25 MG tablet    Sig: Take 1 tablet by mouth daily.    Dispense:  30 tablet    Refill:  1    Order Specific Question:   Supervising Provider    Answer:   Donnamae Jude [[id number]]  . ferrous sulfate 325 (65 FE) MG tablet    Sig: Take 1 tablet (325 mg total) by mouth 2 (two) times daily.    Dispense:  60 tablet    Refill:  3    Order Specific Question:   Supervising Provider    Answer:   Donnamae Jude [[id number]]  . docusate sodium (COLACE) 100 MG capsule    Sig: Take 1 capsule (100 mg total) by mouth 2 (two) times daily.    Dispense:  60 capsule    Refill:  0    Order Specific Question:   Supervising Provider    Answer:   Donnamae Jude [[id number]]  . polyethylene glycol (MIRALAX / GLYCOLAX) packet    Sig: Take 17 g by mouth daily.    Dispense:  14 each    Refill:  0    Order Specific Question:   Supervising Provider    Answer:   Donnamae Jude [[id number]]    MDM -IV LR with IV hydralazine and IV procardia; BP responded well to IV procardia. Feraheme as CBC showed 6.7 Hgb.   Assessment: 1. Chronic hypertension    Patient's labs are reassuring; patient had mild ha in MAU which went away completely with fioricet.  Patient had Feraheme infusion. Patient in no distress but very likely dehydrated from BF two twin newborns.   Discussed case with Dr. Nehemiah Settle, who recommends discharge on Vasotec and HCTZ and return  to clinic  Plan: - Discharge home in stable condition - Strict pre-e return precautions given precautions. -Return to clinic on Monday morning at 9 am for BP check.  - Follow-up as scheduled at your doctor's office for next prenatal visit or sooner as needed if symptoms worsen. - Return to maternity admissions symptoms worsen  -All questions answered; patient stable  for discharge.   Starr Lake, CNM 03/27/2018 4:50 PM    Starr Lake, [place] 03/27/2018 4:49 PM

## 2018-03-29 ENCOUNTER — Encounter (HOSPITAL_COMMUNITY): Payer: Self-pay | Admitting: *Deleted

## 2018-03-29 ENCOUNTER — Inpatient Hospital Stay (HOSPITAL_COMMUNITY)
Admission: AD | Admit: 2018-03-29 | Discharge: 2018-03-29 | Disposition: A | Discharge status: Home / Self Care | Payer: Medicaid Other | Source: Ambulatory Visit | Attending: Obstetrics and Gynecology | Admitting: Obstetrics and Gynecology

## 2018-03-29 DIAGNOSIS — I10 Essential (primary) hypertension: Secondary | ICD-10-CM

## 2018-03-29 DIAGNOSIS — R300 Dysuria: Secondary | ICD-10-CM | POA: Diagnosis present

## 2018-03-29 DIAGNOSIS — R109 Unspecified abdominal pain: Secondary | ICD-10-CM | POA: Insufficient documentation

## 2018-03-29 DIAGNOSIS — O165 Unspecified maternal hypertension, complicating the puerperium: Secondary | ICD-10-CM | POA: Diagnosis not present

## 2018-03-29 DIAGNOSIS — O8620 Urinary tract infection following delivery, unspecified: Secondary | ICD-10-CM | POA: Diagnosis not present

## 2018-03-29 LAB — URINALYSIS, ROUTINE W REFLEX MICROSCOPIC
Bilirubin Urine: NEGATIVE
Glucose, UA: NEGATIVE mg/dL
KETONES UR: NEGATIVE mg/dL
NITRITE: POSITIVE — AB
PH: 6 (ref 5.0–8.0)
Protein, ur: 100 mg/dL — AB
Specific Gravity, Urine: 1.011 (ref 1.005–1.030)
WBC, UA: >50 WBC/hpf — ABNORMAL HIGH (ref 0–5)

## 2018-03-29 LAB — CBC
HCT: 25.9 % — ABNORMAL LOW (ref 36.0–46.0)
HEMOGLOBIN: 8.1 g/dL — AB (ref 12.0–15.0)
MCH: 25.9 pg — AB (ref 26.0–34.0)
MCHC: 31.3 g/dL (ref 30.0–36.0)
MCV: 82.7 fL (ref 78.0–100.0)
Platelets: 505 10*3/uL — ABNORMAL HIGH (ref 150–400)
RBC: 3.13 MIL/uL — ABNORMAL LOW (ref 3.87–5.11)
RDW: 16.2 % — ABNORMAL HIGH (ref 11.5–15.5)
WBC: 16.9 10*3/uL — AB (ref 4.0–10.5)

## 2018-03-29 LAB — COMPREHENSIVE METABOLIC PANEL
ALBUMIN: 3 g/dL — AB (ref 3.5–5.0)
ALK PHOS: 83 U/L (ref 38–126)
ALT: 37 U/L (ref 0–44)
AST: 27 U/L (ref 15–41)
Anion gap: 10 (ref 5–15)
BUN: 16 mg/dL (ref 6–20)
CO2: 24 mmol/L (ref 22–32)
Calcium: 8.4 mg/dL — ABNORMAL LOW (ref 8.9–10.3)
Chloride: 101 mmol/L (ref 98–111)
Creatinine, Ser: 0.95 mg/dL (ref 0.44–1.00)
GFR calc non Af Amer: >60 mL/min (ref >60)
Glucose, Bld: 117 mg/dL — ABNORMAL HIGH (ref 70–99)
Potassium: 3.8 mmol/L (ref 3.5–5.1)
SODIUM: 135 mmol/L (ref 135–145)
TOTAL PROTEIN: 6.3 g/dL — AB (ref 6.5–8.1)
Total Bilirubin: 0.5 mg/dL (ref 0.3–1.2)

## 2018-03-29 MED ORDER — CEPHALEXIN 500 MG PO CAPS: 500.0000 mg | ORAL_CAPSULE | Freq: Four times a day (QID) | ORAL | 0 refills | Status: DC

## 2018-03-29 MED ORDER — LABETALOL HCL 5 MG/ML IV SOLN
40.0000 mg | INTRAVENOUS | Status: DC | PRN
  Administered 2018-03-29: 40 mg via INTRAVENOUS
  Filled 2018-03-29: qty 8

## 2018-03-29 MED ORDER — OXYCODONE HCL 5 MG PO TABS
5.0000 mg | ORAL_TABLET | Freq: Once | ORAL | Status: AC
  Administered 2018-03-29: 5 mg via ORAL
  Filled 2018-03-29: qty 1

## 2018-03-29 MED ORDER — NIFEDIPINE 10 MG PO CAPS
20.0000 mg | ORAL_CAPSULE | ORAL | Status: DC | PRN
  Administered 2018-03-29: 20 mg via ORAL
  Filled 2018-03-29: qty 2

## 2018-03-29 MED ORDER — KETOROLAC TROMETHAMINE 30 MG/ML IJ SOLN
30.0000 mg | Freq: Once | INTRAMUSCULAR | Status: DC
  Filled 2018-03-29: qty 1

## 2018-03-29 MED ORDER — SIMETHICONE 80 MG PO CHEW: 80.0000 mg | CHEWABLE_TABLET | Freq: Four times a day (QID) | ORAL | 0 refills | Status: DC | PRN

## 2018-03-29 MED ORDER — CEPHALEXIN 500 MG PO CAPS: 500.0000 mg | ORAL_CAPSULE | Freq: Four times a day (QID) | ORAL | 0 refills | Status: AC

## 2018-03-29 MED ORDER — SIMETHICONE 80 MG PO CHEW
160.0000 mg | CHEWABLE_TABLET | Freq: Four times a day (QID) | ORAL | Status: DC
  Administered 2018-03-29 (×2): 160 mg via ORAL
  Filled 2018-03-29 (×2): qty 2

## 2018-03-29 MED ORDER — HYDRALAZINE HCL 20 MG/ML IJ SOLN
5.0000 mg | Freq: Once | INTRAMUSCULAR | Status: AC
  Administered 2018-03-29: 5 mg via INTRAVENOUS
  Filled 2018-03-29: qty 1

## 2018-03-29 MED ORDER — LACTATED RINGERS IV SOLN
INTRAVENOUS | Status: DC
  Administered 2018-03-29: 19:00:00 via INTRAVENOUS

## 2018-03-29 MED ORDER — NIFEDIPINE 10 MG PO CAPS
10.0000 mg | ORAL_CAPSULE | ORAL | Status: DC | PRN
  Administered 2018-03-29: 10 mg via ORAL
  Filled 2018-03-29: qty 1

## 2018-03-29 NOTE — MAU Provider Note (Addendum)
History     CSN: [id number]  Arrival date and time: 03/29/18 1657   First Provider Initiated Contact with Patient 03/29/18 1722      Chief Complaint  Patient presents with  . Abdominal Pain  . Dysuria   37 y.o. Female 10 days postop here with abdominal distension, gas, and incisional pain. Sx started last night. She was unable to pass gas. She took Miralax last night. She then took stool softener and an enema today and was able to have 2 BMs. She had some relief. Is also having incisional pain and dysuria. She has not used anything for the pain. She was given Rx for Vasotec 2 days ago and took the first dose last night. She thinks the medicine caused these sx. No fevers. Lochia is minimal. Denies HA, visual disturbances, RUQ pain, SOB, and CP. Has intermittent heartburn. Hx of cHTN and si PEC and received Mg post delivery.   Past Medical History:  Diagnosis Date  . Complication of anesthesia    pain with surgery  . GERD (gastroesophageal reflux disease)   . History of kidney infection during pregnancy   . Hypertension   . PP NVB 02/02/12 02/02/2012  . Unspecified hemorrhoids without mention of complication     Past Surgical History:  Procedure Laterality Date  . CESAREAN SECTION N/A 09/13/2014   Procedure: CESAREAN SECTION;  Surgeon: Levie HeritageJacob J Stinson, DO;  Location: [facility];  Service: Obstetrics;  Laterality: N/A;  . CESAREAN SECTION MULTI-GESTATIONAL N/A 03/19/2018   Procedure: REPEAT CESAREAN SECTION MULTI-GESTATIONAL;  Surgeon: Federico FlakeNewton, Kimberly Niles, MD;  [facility];  Service: Obstetrics;  Laterality: N/A;    Family History  Problem Relation Age of Onset  . Hypertension Father   . Hypertension Mother   . Anesthesia problems Neg Hx     Social History   Tobacco Use  . Smoking status: Never Smoker  . Smokeless tobacco: Never Used  Substance Use Topics  . Alcohol use: No  . Drug use: No    Allergies: No Known Allergies  Medications Prior to Admission   Medication Sig Dispense Refill Last Dose  . calcium carbonate (TUMS - DOSED IN MG ELEMENTAL CALCIUM) 500 MG chewable tablet Chew 2 tablets by mouth daily as needed for indigestion or heartburn.   03/27/2018 at Unknown time  . docusate sodium (COLACE) 100 MG capsule Take 1 capsule (100 mg total) by mouth 2 (two) times daily. 60 capsule 0   . enalapril-hydrochlorothiazide (VASERETIC) 10-25 MG tablet Take 1 tablet by mouth daily. 30 tablet 1   . ferrous sulfate 325 (65 FE) MG tablet Take 1 tablet (325 mg total) by mouth 2 (two) times daily. 60 tablet 3   . oxyCODONE-acetaminophen (PERCOCET/ROXICET) 5-325 MG tablet Take 1 tablet by mouth every 4 (four) hours as needed (for pain scale greater than or equal to 4 and less than 7). 24 tablet 0 03/27/2018 at 0900  . polyethylene glycol (MIRALAX / GLYCOLAX) packet Take 17 g by mouth daily. 14 each 0   . Prenatal Multivit-Min-Fe-FA (PRENATAL VITAMINS) 0.8 MG tablet Take 1 tablet by mouth daily. 30 tablet 12 03/26/2018 at Unknown time    Review of Systems  Constitutional: Negative for chills and fever.  Eyes: Negative for visual disturbance.  Cardiovascular: Negative for leg swelling.  Gastrointestinal: Positive for abdominal pain. Negative for constipation, diarrhea, nausea and vomiting.  Genitourinary: Positive for dysuria and vaginal bleeding.  Neurological: Negative for headaches.   Physical Exam   Blood pressure Marland Kitchen(!)  155/84, pulse 97, temperature 98.3 F (36.8 C), temperature source Oral, resp. rate 18, weight 257 lb 1.9 oz (116.6 kg), SpO2 100 %, unknown if currently breastfeeding. Patient Vitals for the past 24 hrs:  BP Temp Temp src Pulse Resp SpO2 Weight  03/29/18 2040 (!) 144/77 - - (!) 104 18 - -  03/29/18 2016 (!) 142/74 - - (!) 106 - - -  03/29/18 2001 (!) 157/86 - - 100 - - -  03/29/18 1954 (!) 155/84 - - 98 - - -  03/29/18 1931 (!) 164/90 - - 97 18 - -  03/29/18 1918 (!) 162/100 - - 97 - - -  03/29/18 1901 (!) 169/98 - - 100 - - -   03/29/18 1846 (!) 163/92 - - 99 - - -  03/29/18 1832 (!) 161/96 - - (!) 101 - - -  03/29/18 1808 (!) 170/91 - - (!) 101 - - -  03/29/18 1801 (!) 173/99 - - (!) 102 - - -  03/29/18 1748 (!) 179/102 - - (!) 102 - - -  03/29/18 1731 (!) 172/102 - - (!) 102 - - -  03/29/18 1717 (!) 174/108 - - (!) 110 - - -  03/29/18 1711 (!) 179/98 98.3 F (36.8 C) Oral (!) 113 20 100 % 257 lb 1.9 oz (116.6 kg)   Physical Exam  Constitutional: She is oriented to person, place, and time. She appears well-developed and well-nourished. No distress.  HENT:  Head: Normocephalic and atraumatic.  Neck: Normal range of motion.  Cardiovascular: Normal rate, regular rhythm and normal heart sounds.  Mild tachy  Respiratory: Effort normal and breath sounds normal. No respiratory distress. She has no wheezes. She has no rales.  GI: Soft. Bowel sounds are normal. She exhibits distension (mild). She exhibits no mass. There is tenderness (diffuse, all quadrants). There is no rebound and no guarding.  Musculoskeletal: Normal range of motion. She exhibits no edema.  Neurological: She is alert and oriented to person, place, and time.  Skin: Skin is warm and dry.  Psychiatric: She has a normal mood and affect.   Results for orders placed or performed during the hospital encounter of 03/29/18 (from the past 24 hour(s))  Urinalysis, Routine w reflex microscopic     Status: Abnormal   Collection Time: 03/29/18  5:38 PM  Result Value Ref Range   Color, Urine YELLOW YELLOW   APPearance HAZY (A) CLEAR   Specific Gravity, Urine 1.011 1.005 - 1.030   pH 6.0 5.0 - 8.0   Glucose, UA NEGATIVE NEGATIVE mg/dL   Hgb urine dipstick LARGE (A) NEGATIVE   Bilirubin Urine NEGATIVE NEGATIVE   Ketones, ur NEGATIVE NEGATIVE mg/dL   Protein, ur 161 (A) NEGATIVE mg/dL   Nitrite POSITIVE (A) NEGATIVE   Leukocytes, UA LARGE (A) NEGATIVE   RBC / HPF 11-20 0 - 5 RBC/hpf   WBC, UA >50 (H) 0 - 5 WBC/hpf   Bacteria, UA RARE (A) NONE SEEN    Squamous Epithelial / LPF 0-5 0 - 5   WBC Clumps PRESENT    Mucus PRESENT    Budding Yeast PRESENT    Non Squamous Epithelial 0-5 (A) NONE SEEN   MAU Course  Procedures Procardia IR Labetalol Simethicone Oxycodone Hydralazine  MDM Labs ordered. Consult with Dr. Jolayne Panther, recommend Hydralazine since no response to Procardia and Labetalol. Labs pending. Transfer of care given to Dorna Bloom, [place]  03/29/2018 8:03 PM   Patient Vitals for the past 24 hrs:  BP Temp Temp src Pulse Resp SpO2 Weight  03/29/18 2016 (!) 142/74 - - (!) 106 - - -  03/29/18 2001 (!) 157/86 - - 100 - - -  03/29/18 1954 (!) 155/84 - - 98 - - -  03/29/18 1931 (!) 164/90 - - 97 18 - -  03/29/18 1918 (!) 162/100 - - 97 - - -  03/29/18 1901 (!) 169/98 - - 100 - - -  03/29/18 1846 (!) 163/92 - - 99 - - -  03/29/18 1832 (!) 161/96 - - (!) 101 - - -  03/29/18 1808 (!) 170/91 - - (!) 101 - - -  03/29/18 1801 (!) 173/99 - - (!) 102 - - -  03/29/18 1748 (!) 179/102 - - (!) 102 - - -  03/29/18 1731 (!) 172/102 - - (!) 102 - - -  03/29/18 1717 (!) 174/108 - - (!) 110 - - -  03/29/18 1711 (!) 179/98 98.3 F (36.8 C) Oral (!) 113 20 100 % 257 lb 1.9 oz (116.6 kg)    Results for orders placed or performed during the hospital encounter of 03/29/18 (from the past 24 hour(s))  Urinalysis, Routine w reflex microscopic     Status: Abnormal   Collection Time: 03/29/18  5:38 PM  Result Value Ref Range   Color, Urine YELLOW YELLOW   APPearance HAZY (A) CLEAR   Specific Gravity, Urine 1.011 1.005 - 1.030   pH 6.0 5.0 - 8.0   Glucose, UA NEGATIVE NEGATIVE mg/dL   Hgb urine dipstick LARGE (A) NEGATIVE   Bilirubin Urine NEGATIVE NEGATIVE   Ketones, ur NEGATIVE NEGATIVE mg/dL   Protein, ur 981 (A) NEGATIVE mg/dL   Nitrite POSITIVE (A) NEGATIVE   Leukocytes, UA LARGE (A) NEGATIVE   RBC / HPF 11-20 0 - 5 RBC/hpf   WBC, UA >50 (H) 0 - 5 WBC/hpf   Bacteria, UA RARE (A) NONE SEEN   Squamous Epithelial / LPF  0-5 0 - 5   WBC Clumps PRESENT    Mucus PRESENT    Budding Yeast PRESENT    Non Squamous Epithelial 0-5 (A) NONE SEEN  CBC     Status: Abnormal   Collection Time: 03/29/18  7:57 PM  Result Value Ref Range   WBC 16.9 (H) 4.0 - 10.5 K/uL   RBC 3.13 (L) 3.87 - 5.11 MIL/uL   Hemoglobin 8.1 (L) 12.0 - 15.0 g/dL   HCT 19.1 (L) 47.8 - 29.5 %   MCV 82.7 78.0 - 100.0 fL   MCH 25.9 (L) 26.0 - 34.0 pg   MCHC 31.3 30.0 - 36.0 g/dL   RDW 62.1 (H) 30.8 - 65.7 %   Platelets 505 (H) 150 - 400 K/uL  Comprehensive metabolic panel     Status: Abnormal   Collection Time: 03/29/18  7:57 PM  Result Value Ref Range   Sodium 135 135 - 145 mmol/L   Potassium 3.8 3.5 - 5.1 mmol/L   Chloride 101 98 - 111 mmol/L   CO2 24 22 - 32 mmol/L   Glucose, Bld 117 (H) 70 - 99 mg/dL   BUN 16 6 - 20 mg/dL   Creatinine, Ser 8.46 0.44 - 1.00 mg/dL   Calcium 8.4 (L) 8.9 - 10.3 mg/dL   Total Protein 6.3 (L) 6.5 - 8.1 g/dL   Albumin 3.0 (L) 3.5 - 5.0 g/dL   AST 27 15 - 41 U/L   ALT 37 0 - 44 U/L   Alkaline Phosphatase 83 38 - 126  U/L   Total Bilirubin 0.5 0.3 - 1.2 mg/dL   GFR calc non Af Amer >60 >60 mL/min   GFR calc Af Amer >60 >60 mL/min   Anion gap 10 5 - 15   Meds ordered this encounter  Medications  . AND Linked Order Group   . NIFEdipine (PROCARDIA) capsule 10 mg   . NIFEdipine (PROCARDIA) capsule 20 mg   . NIFEdipine (PROCARDIA) capsule 20 mg   . labetalol (NORMODYNE,TRANDATE) injection 40 mg  . simethicone (MYLICON) chewable tablet 160 mg  . lactated ringers infusion  . DISCONTD: ketorolac (TORADOL) 30 MG/ML injection 30 mg  . hydrALAZINE (APRESOLINE) injection 5 mg  . oxyCODONE (Oxy IR/ROXICODONE) immediate release tablet 5 mg  . DISCONTD: cephALEXin (KEFLEX) 500 MG capsule    Sig: Take 1 capsule (500 mg total) by mouth 4 (four) times daily for 10 days.    Dispense:  40 capsule    Refill:  0    Order Specific Question:   Supervising Provider    Answer:   Reva Bores [[id number]]  . DISCONTD:  simethicone (GAS-X) 80 MG chewable tablet    Sig: Chew 1 tablet (80 mg total) by mouth every 6 (six) hours as needed for flatulence.    Dispense:  30 tablet    Refill:  0    Order Specific Question:   Supervising Provider    Answer:   Reva Bores [[id number]]  . cephALEXin (KEFLEX) 500 MG capsule    Sig: Take 1 capsule (500 mg total) by mouth 4 (four) times daily for 10 days.    Dispense:  40 capsule    Refill:  0    Order Specific Question:   Supervising Provider    Answer:   Reva Bores [[id number]]  . simethicone (GAS-X) 80 MG chewable tablet    Sig: Chew 1 tablet (80 mg total) by mouth every 6 (six) hours as needed for flatulence.    Dispense:  30 tablet    Refill:  0    Order Specific Question:   Supervising Provider    Answer:   Reva Bores [[id number]]   BP responsive to meds administered in MAU Assessment and Plan  --Chronic hypertension in postpartum period, continue Vasotec as ordered --UTI, rx Keflex, urine culture ordered --Reviewed signs/symptoms that would merit return to MAU --Discharge home in stable condition  F/U:  BP recheck scheduled for Monday 03/31/18  Clayton Bibles, CNM 03/29/18  9:02 PM

## 2018-03-29 NOTE — MAU Note (Signed)
Colleen Terrell is a 37 y.o. at Unknown here in MAU reporting:  +abdominal pain She feels like she is distended. "lots of gas" Took tums, an enema, and a "gas pill" today. Had a bm today. But still feeling bad and having incisional pain now.  Onset of complaint: started yesterday Pain score: 10/10  C/section was on the 24th +dysuria States she just got her miralax and bp medication last night. Takes her bp meds at night. Vitals:   03/29/18 1711  BP: (!) 179/98  Pulse: (!) 113  Resp: 20  Temp: 98.3 F (36.8 C)  SpO2: 100%   Lab orders placed from triage: ua cath for pp c/s

## 2018-03-29 NOTE — Discharge Instructions (Signed)
Postpartum Hypertension °Postpartum hypertension is high blood pressure after pregnancy that remains higher than normal for more than two days after delivery. You may not realize that you have postpartum hypertension if your blood pressure is not being checked regularly. In some cases, postpartum hypertension will go away on its own, usually within a week of delivery. However, for some women, medical treatment is required to prevent serious complications, such as seizures or stroke. °The following things can affect your blood pressure: °· The type of delivery you had. °· Having received IV fluids or other medicines during or after delivery. ° °What are the causes? °Postpartum hypertension may be caused by any of the following or by a combination of any of the following: °· Hypertension that existed before pregnancy (chronic hypertension). °· Gestational hypertension. °· Preeclampsia or eclampsia. °· Receiving a lot of fluid through an IV during or after delivery. °· Medicines. °· HELLP syndrome. °· Hyperthyroidism. °· Stroke. °· Other rare neurological or blood disorders. ° °In some cases, the cause may not be known. °What increases the risk? °Postpartum hypertension can be related to one or more risk factors, such as: °· Chronic hypertension. In some cases, this may not have been diagnosed before pregnancy. °· Obesity. °· Type 2 diabetes. °· Kidney disease. °· Family history of preeclampsia. °· Other medical conditions that cause hormonal imbalances. ° °What are the signs or symptoms? °As with all types of hypertension, postpartum hypertension may not have any symptoms. Depending on how high your blood pressure is, you may experience: °· Headaches. These may be mild, moderate, or severe. They may also be steady, constant, or sudden in onset (thunderclap headache). °· Visual changes. °· Dizziness. °· Shortness of breath. °· Swelling of your hands, feet, lower legs, or face. In some cases, you may have swelling in  more than one of these locations. °· Heart palpitations or a racing heartbeat. °· Difficulty breathing while lying down. °· Decreased urination. ° °Other rare signs and symptoms may include: °· Sweating more than usual. This lasts longer than a few days after delivery. °· Chest pain. °· Sudden dizziness when you get up from sitting or lying down. °· Seizures. °· Nausea or vomiting. °· Abdominal pain. ° °How is this diagnosed? °The diagnosis of postpartum hypertension is made through a combination of physical examination findings and testing of your blood and urine. You may also have additional tests, such as a CT scan or an MRI, to check for other complications of postpartum hypertension. °How is this treated? °When blood pressure is high enough to require treatment, your options may include: °· Medicines to reduce blood pressure (antihypertensives). Tell your health care provider if you are breastfeeding or if you plan to breastfeed. There are many antihypertensive medicines that are safe to take while breastfeeding. °· Stopping medicines that may be causing hypertension. °· Treating medical conditions that are causing hypertension. °· Treating the complications of hypertension, such as seizures, stroke, or kidney problems. ° °Your health care provider will also continue to monitor your blood pressure closely and repeatedly until it is within a safe range for you. °Follow these instructions at home: °· Take medicines only as directed by your health care provider. °· Get regular exercise after your health care provider tells you that it is safe. °· Follow your health care provider’s recommendations on fluid and salt restrictions. °· Do not use any tobacco products, including cigarettes, chewing tobacco, or electronic cigarettes. If you need help quitting, ask your health care provider. °·   Keep all follow-up visits as directed by your health care provider. This is important. °Contact a health care provider  if: °· Your symptoms get worse. °· You have new symptoms, such as: °? Headache. °? Dizziness. °? Visual changes. °Get help right away if: °· You develop a severe or sudden headache. °· You have seizures. °· You develop numbness or weakness on one side of your body. °· You have difficulty thinking, speaking, or swallowing. °· You develop severe abdominal pain. °· You develop difficulty breathing, chest pain, a racing heartbeat, or heart palpitations. °These symptoms may represent a serious problem that is an emergency. Do not wait to see if the symptoms will go away. Get medical help right away. Call your local emergency services (911 in the [place] Do not drive yourself to the hospital. °This information is not intended to replace advice given to you by your health care provider. Make sure you discuss any questions you have with your health care provider. °Document Released: 04/16/2014 Document Revised: 01/16/2016 Document Reviewed: 02/25/2014 °Elsevier Interactive Patient Education © 2018 [employer] ° °

## 2018-03-31 ENCOUNTER — Ambulatory Visit (INDEPENDENT_AMBULATORY_CARE_PROVIDER_SITE_OTHER): Payer: Medicaid Other | Admitting: General Practice

## 2018-03-31 VITALS — BP 150/100 | HR 102 | Wt 248.0 lb

## 2018-03-31 DIAGNOSIS — Z0130 Encounter for examination of blood pressure without abnormal findings: Secondary | ICD-10-CM

## 2018-03-31 MED ORDER — ENALAPRIL-HYDROCHLOROTHIAZIDE 10-25 MG PO TABS: 2.0000 | ORAL_TABLET | Freq: Every day | ORAL | 1 refills | Status: DC

## 2018-03-31 MED ORDER — IBUPROFEN 800 MG PO TABS: 800.0000 mg | ORAL_TABLET | Freq: Three times a day (TID) | ORAL | 0 refills | Status: DC | PRN

## 2018-03-31 NOTE — Progress Notes (Signed)
Patient presents to office today for repeat blood pressure check. Patient reports taking BP medication daily at bedtime. Patient denies headaches, dizziness, or blurry vision.   Reviewed patient's elevated BP with Dr Adrian BlackwaterStinson who advises patient begin taking 2 tablets of VASERETIC daily. Patient can follow up at pp visit unless she becomes symptomatic.  Discussed new medication dosage with patient & Rx sent to pharmacy. Discussed calling or coming in if she becomes symptomatic, will otherwise see at pp visit. Patient verbalized understanding to all & asked for prenatal of pain medication. Ibuprofen refilled. Patient had no other questions.

## 2018-04-01 LAB — URINE CULTURE: Culture: >=100000 — AB

## 2018-04-01 NOTE — Progress Notes (Signed)
Chart reviewed - agree with RN documentation.   

## 2018-04-06 ENCOUNTER — Other Ambulatory Visit: Payer: Self-pay | Admitting: Obstetrics & Gynecology

## 2018-04-06 DIAGNOSIS — O09899 Supervision of other high risk pregnancies, unspecified trimester: Secondary | ICD-10-CM

## 2018-04-08 ENCOUNTER — Encounter: Payer: Self-pay | Admitting: Nurse Practitioner

## 2018-04-08 ENCOUNTER — Ambulatory Visit (INDEPENDENT_AMBULATORY_CARE_PROVIDER_SITE_OTHER): Payer: Medicaid Other | Admitting: Nurse Practitioner

## 2018-04-08 VITALS — BP 135/75 | HR 90 | Ht 69.0 in | Wt 245.5 lb

## 2018-04-08 DIAGNOSIS — I10 Essential (primary) hypertension: Secondary | ICD-10-CM

## 2018-04-08 DIAGNOSIS — Z98891 History of uterine scar from previous surgery: Secondary | ICD-10-CM

## 2018-04-08 DIAGNOSIS — N939 Abnormal uterine and vaginal bleeding, unspecified: Secondary | ICD-10-CM

## 2018-04-08 NOTE — Progress Notes (Signed)
Pt states started to pass blood clots on 04/04/18, still passing.

## 2018-04-08 NOTE — Progress Notes (Signed)
GYNECOLOGY OFFICE VISIT NOTE   History:  37 y.o. [id number] here today for a check on her vaginal bleeding.  Was having "bagel" sized blood clots at home.  She had heavier vaginal bleeding with several clots on Friday. She had one clot expelled on Sunday and one today - she had a picture of it on her phone.  She was very worried.  Caring for twins at home. She is breastfeeding and notes that she does have cramping when she feeds.  Had a period of time after she went home where she did not have vaginal bleeding and was very concerned about this bleeding.  She is not dizzy or have any syncope.  She is not going out of the house and is not doing strenuous house work.  She cares for her twin babies.  Does not have any complaints about her incision.  Past Medical History:  Diagnosis Date  . Complication of anesthesia    pain with surgery  . GERD (gastroesophageal reflux disease)   . History of kidney infection during pregnancy   . Hypertension   . PP NVB 02/02/12 02/02/2012  . Unspecified hemorrhoids without mention of complication     Past Surgical History:  Procedure Laterality Date  . CESAREAN SECTION N/A 09/13/2014   Procedure: CESAREAN SECTION;  Surgeon: Levie HeritageJacob J Stinson, DO;  Location: [facility];  Service: Obstetrics;  Laterality: N/A;  . CESAREAN SECTION MULTI-GESTATIONAL N/A 03/19/2018   Procedure: REPEAT CESAREAN SECTION MULTI-GESTATIONAL;  Surgeon: Federico FlakeNewton, Kimberly Niles, MD;  [facility];  Service: Obstetrics;  Laterality: N/A;    The following portions of the patient's history were reviewed and updated as appropriate: allergies, current medications, past family history, past medical history, past social history, past surgical history and problem list.      Review of Systems:  Pertinent items noted in HPI and remainder of comprehensive ROS otherwise negative.  Objective:  Physical Exam BP 135/75 (BP Location: Left Arm, Cuff Size: Large)   Pulse 90   Ht 5\' 9"  (1.753 m)    Wt 245 lb 8 oz (111.4 kg)   BMI 36.25 kg/m  CONSTITUTIONAL: Well-developed, well-nourished female in no acute distress.  HENT:  Normocephalic, atraumatic. External right and left ear normal.  EYES: Conjunctivae and EOM are normal. Pupils are equal, round.  No scleral icterus.  NECK: Normal range of motion, supple,  SKIN: Skin is warm and dry. No rash noted. Not diaphoretic. No erythema. No pallor. NEUROLOGIC: Alert and oriented to person, place, and time. Normal muscle tone coordination. No cranial nerve deficit noted. PSYCHIATRIC: Normal mood and affect. Normal behavior. Normal judgment and thought content. CARDIOVASCULAR: Normal heart rate noted RESPIRATORY: Effort normal, no problems with respiration noted ABDOMEN: Soft, no distention noted.  Fundus palpated below the umbilicus,  Firm to palpation and cramping is localized to the uterus.  Steristrips seen but bunched up and not in place.  Has 1.5 cm areas on both sides of the incision where the outer skin is not healed completely.  No drainage, no celluliltis, nontender.  Discussed this area with client and gave her some guaze to use in the panus fold to keep the area very dry. PELVIC: speculum exam done and a small amount of dark blood seen in the vault.  No active bleeding from the cervix.  Cervix appears closed. MUSCULOSKELETAL: Normal range of motion. No edema noted.    Assessment & Plan:  1. Abnormal vaginal bleeding Reassured client about her bleeding.  Expect the  HGB to be OK but labs checked today. - CBC  2.  History of chronic hypertension in pregnancy Continues to take BP medication and her BP is relatively normal today.  3.  C/S incision Keep area dry.  Expect continuing to heal.  Return if area is tender or draining.  Please refer to After Visit Summary for other counseling recommendations.   Appointment already scheduled for postpartum appointment later in August - client is aware of date.  Total face-to-face time  with patient: 15 minutes.  Over 50% of encounter was spent on counseling and coordination of care.  Nolene BernheimERRI BURLESON, RN, MSN, NP-BC Nurse Practitioner, [facility], [facility] 04/08/2018 7:58 PM

## 2018-04-09 LAB — CBC
HEMATOCRIT: 33.2 % — AB (ref 34.0–46.6)
Hemoglobin: 10.2 g/dL — ABNORMAL LOW (ref 11.1–15.9)
MCH: 25.6 pg — ABNORMAL LOW (ref 26.6–33.0)
MCHC: 30.7 g/dL — ABNORMAL LOW (ref 31.5–35.7)
MCV: 83 fL (ref 79–97)
Platelets: 495 10*3/uL — ABNORMAL HIGH (ref 150–450)
RBC: 3.99 x10E6/uL (ref 3.77–5.28)
RDW: 18.5 % — ABNORMAL HIGH (ref 12.3–15.4)
WBC: 7.4 10*3/uL (ref 3.4–10.8)

## 2018-04-24 ENCOUNTER — Ambulatory Visit (INDEPENDENT_AMBULATORY_CARE_PROVIDER_SITE_OTHER): Payer: Medicaid Other | Admitting: Obstetrics and Gynecology

## 2018-04-24 ENCOUNTER — Encounter: Payer: Self-pay | Admitting: Obstetrics and Gynecology

## 2018-04-24 VITALS — BP 132/83 | HR 82 | Ht 69.0 in | Wt 252.1 lb

## 2018-04-24 DIAGNOSIS — Z98891 History of uterine scar from previous surgery: Secondary | ICD-10-CM

## 2018-04-24 DIAGNOSIS — Z3202 Encounter for pregnancy test, result negative: Secondary | ICD-10-CM

## 2018-04-24 DIAGNOSIS — Z3042 Encounter for surveillance of injectable contraceptive: Secondary | ICD-10-CM

## 2018-04-24 DIAGNOSIS — Z1389 Encounter for screening for other disorder: Secondary | ICD-10-CM

## 2018-04-24 DIAGNOSIS — I10 Essential (primary) hypertension: Secondary | ICD-10-CM

## 2018-04-24 LAB — POCT PREGNANCY, URINE: PREG TEST UR: NEGATIVE

## 2018-04-24 MED ORDER — MEDROXYPROGESTERONE ACETATE 150 MG/ML IM SUSP
150.0000 mg | Freq: Once | INTRAMUSCULAR | Status: AC
  Administered 2018-04-24: 150 mg via INTRAMUSCULAR

## 2018-04-24 MED ORDER — MEDROXYPROGESTERONE ACETATE 150 MG/ML IM SUSP: 150.0000 mg | INTRAMUSCULAR | 0 refills | Status: DC

## 2018-04-24 NOTE — Patient Instructions (Signed)

## 2018-04-24 NOTE — Progress Notes (Signed)
Subjective:     Colleen Terrell is a 37 y.o. female who presents for a postpartum visit. She is 5 weeks postpartum following a low cervical transverse Cesarean section. I have fully reviewed the prenatal and intrapartum course. The delivery was at 36 gestational weeks. Outcome: repeat cesarean section, low transverse incision. Anesthesia: spinal. Postpartum course has been complicated by hypertension. However BP has been well controlled with medications.Taking Vasoretic once a day vs twice a day for the past week.  Baby's course has been uncomplicated. Baby is feeding by breast and formula.. Bleeding staining only. Bowel function is normal. Bladder function is normal. Patient is not sexually active. Contraception method is none. Postpartum depression screening: negative.  The following portions of the patient's history were reviewed and updated as appropriate: allergies, current medications, past family history, past medical history, past social history and past surgical history.  Review of Systems Pertinent items noted in HPI and remainder of comprehensive ROS otherwise negative.   Objective:    There were no vitals taken for this visit.  General:  alert   Breasts:  not examined  Lungs: clear to auscultation bilaterally  Heart:  regular rate and rhythm, S1, S2 normal, no murmur, click, rub or gallop  Abdomen: soft, non-tender; bowel sounds normal; no masses,  no organomegaly, incision healing well   Vulva:  not evaluated  Vagina: not evaluated  Cervix:  not evaluated  Corpus: not examined  Adnexa:  not evaluated  Rectal Exam: Not performed.        Assessment:     Nl postpartum exam.     Hypertension   Contraception Plan:    1. Contraception: Depo-Provera injections UPT today negative   2. Return to normal ADL's 3. Pt referred to PCP for management of BP. BP stable on Vasorectic qd.  4. F/U in 1-2 weeks for yearly GYN exam with female provider.

## 2018-05-14 ENCOUNTER — Encounter: Payer: Self-pay | Admitting: Student

## 2018-05-14 ENCOUNTER — Ambulatory Visit (INDEPENDENT_AMBULATORY_CARE_PROVIDER_SITE_OTHER): Payer: Medicaid Other | Admitting: Student

## 2018-05-14 ENCOUNTER — Other Ambulatory Visit (HOSPITAL_COMMUNITY)
Admission: RE | Admit: 2018-05-14 | Discharge: 2018-05-14 | Disposition: A | Discharge status: Home / Self Care | Payer: Medicaid Other | Source: Ambulatory Visit | Attending: Student | Admitting: Student

## 2018-05-14 VITALS — BP 163/92 | HR 80 | Wt 257.2 lb

## 2018-05-14 DIAGNOSIS — Z01419 Encounter for gynecological examination (general) (routine) without abnormal findings: Secondary | ICD-10-CM | POA: Diagnosis present

## 2018-05-14 DIAGNOSIS — Z0000 Encounter for general adult medical examination without abnormal findings: Secondary | ICD-10-CM | POA: Diagnosis not present

## 2018-05-14 NOTE — Patient Instructions (Addendum)
[place] Resource Guide (Revised August 2014)    Insufficient Money for Medicine:           United Way: call "211"   MAP Program at [facility] [place] 641-8030 or [place] [phone]            No Primary Care Doctor:  To locate a primary care doctor that accepts your insurance or provides certain services:           [facility]: [phone]           Physician Referral Service: 1-800-533-3463 ask for "My [place]" If no insurance, you need to see if you qualify for GCCN "orange card", call to set      up appointment for eligibility/enrollment at [phone] or [phone] or visit [place] [facility] ([address], [place] and [address] -[place]) to meet with a GCCN enrollment specialist.  Agencies that provide inexpensive (sliding fee scale) medical care:      [facility] - [facility] at [place] - [phone]    [facility]  -  [facility] - [phone]    [facility] - [phone]    [facility] - [phone]    [facility] - [phone]    [facility] - [phone] [facility] - [facility] [place] - [phone]     [phone] [facility] - [facility] [place] - [phone] [facility]: [phone]  [facility]: [phone]  [facility]: [phone]  [facility] - [phone]- [phone]    Medicaid-accepting [place] Providers:           [facility] - [phone] (No Family Planning accepted)          [address], [phone], Mon-Fri 9am-5pm          [facility] - [phone], [address], Mon-Thursday 8am-5pm, Fri 8am-noon [facility] - [phone]          [address],  [address], Mon-Fri 7:30am-4:30pm          [facility] - [phone]          [address] [place] [address], Mon-Fri 8am-5pm          [facility] - [phone]          [address]          Only accepts [place] Access Medicaid patients after they have their name applied to their card  Self Pay (no insurance) in [place]:           Sickle Cell Patients:  [address], [phone] [facility]: [address], [place] [phone]       [facility] [address],  [phone]   Family Practice: [address], [phone]          [facility]           [address], [phone]  [facility] [address], [phone]           [facility]             [address],  [phone]        [facility] - [address]           [phone], Mon-Fri 9am-7pm, Sat 9am-1pm          [facility] - [facility] @ [place]          [address] [place] [address], [phone]          [facility] [place]           [address], [phone] [facility] - [facility] [place] [address], [place] [phone]          [facility]           [address] [place] [address], [phone]  [facility] - [facility]  [address], [phone] [facility] - [facility] [address], [phone]  Dr. Osei-Bonsu           [address], [place], [phone]          [facility]           [address], [phone]          [facility]             [address], [phone]          [facility]           [address], [phone], 1st & 3rd Saturday every month, 10am-1pm OTHERS:  [facility]  (Immigration Access Clinic Only)  [phone] (Thursday only) Strategies for finding a Primary Care Provider:  1) Find a Doctor and Pay Out of Pocket  Although you won't have to find out who is covered by your insurance plan, it is a good idea to ask around and get recommendations. You will then need to call the office and see if the doctor you have chosen will accept you as a new patient and what types of options they offer for patients who are self-pay. Some doctors offer discounts or will set up payment plans for their patients who do not have insurance, but you will need to ask so you aren't surprised when you get to your appointment.  2) Contact [facility] - To see if you qualify for "orange card" access to healthcare safety net providers.  Call for appointment for eligibility/enrollment at [phone] or [phone]. (Uninsured, 0-200% FPL, qualifying info)  Applicants for GCCN are first required to see if they are eligible to enroll in the ACA Marketplace before enrolling in GCCN (and get an exemption if they are not).  GCCN Criteria for acceptance is:  Proof of ACA Marketing exemption - form or documentation  Valid photo ID (driver's license, state identification card, passport, home country ID)  Proof of [place] residency (e.g. driver's license, lease/landlord information, pay stubs with address, utility bill, bank statement, etc.)  Proof of income (1040, last year's tax return, W2, 4 current pay stubs, other income proof)  Proof of assets (current bank statement + 3 most recent, disability paperwork, life insurance info, tax value on autos, etc.)  3) Contact Your Local Health Department  Not all health departments have doctors that can see patients for sick visits,   but many do, so it is worth a call to see if yours does. If you don't know where your local health  department is, you can check in your phone book. The CDC also has a tool to help you locate your state's health department, and many state websites also have listings of all of their local health departments.  4) Find a Walk-in Clinic  If your illness is not likely to be very severe or complicated, you may want to try a walk in clinic. These are popping up all over the country in pharmacies, drugstores, and shopping centers. They're usually staffed by nurse practitioners or physician assistants that have been trained to treat common illnesses and complaints. They're usually fairly quick and inexpensive. However, if you have serious medical issues or chronic medical problems, these are probably not your best option   STD Testing:           [place] [facility] [place], STD Clinic           [address], [place], phone [phone] or [phone]           Monday - Friday, call for an appointment          [place] [facility] [place], STD Clinic           [address], [place], phone [phone] or [phone]           Monday - Friday, call for an appointment Abuse/Neglect:           [place] Child Abuse Hotline: 641-3795           [place] Child Abuse Hotline: 800-378-5315 (After Hours) Emergency Shelter:  [facility] (336) 271-5959  [facility] [place]- (336) 881-5415  [facility] [place] - (336) 235-0330  [facility] - (336) 375-1332 (ages 11-17)  Homeless Day Shelter @ [facility] - (336) 332-0824   Mammograms - Free at [facility] [place] - 614-3233  Maternity Homes:           [facility]: (336) [phone]   (Homeless mother with children)          [facility]: (704) 372-4663 (Mothers only) [facility]: (336) 370-9232 (Pregnant 16-21 years old) [facility]opt [facility] Mom -(336) 641-7777  [place] Resources  [facility] - [facility] [address], [place] (336) [phone]          [facility] [place]           [address], [place]           [phone]          United Way           [address], [place]           [phone]          [place] [facility]           [address], [place]           [phone]          [place] [facility]           [phone]          [place] [facility] - [facility]           1-888-581-9988            [facility] [place]           [address]           [phone], Insurance          [place] Child Abuse Hotline           (336) 342-1394           (336) 342-3537 (After Hours)  Behavioral Health Services /Substance Abuse Resources:           [facility]: 882-2125           [facility]: 784-9470          [facility]: (336) 285-9073  Narcotics Helpline - 1-866-375-1272          [facility]: (336) 845-3988           Residential & Outpatient Substance Abuse Program - [facility]: 800-659-3381 [employer] - (336) 285-2605 Psychological Services:          [facility]: [phone]  [facility]: 1-877-626-1772          [facility]           [address] [place] [address], [place]           ACCESS LINE: 1-800-256-2452     (24 Hour) Mobile Crisis:  [place]:  [facility] on Mental Illness - [place] (336) 370-4264 [facility] - [place] (800) 451-9682 Walk In Crisis Services       [facility] - [address] [place] [address] - [place]  (336)676-6905       [facility] - (336)832-9600 or (336) 832-9700  [facility] - [address] - [place] (336)[phone]  [facility] - [address], [place] (336) [phone]   Dental Assistance:   If unable to pay or uninsured, contact: [place] [facility] to become qualified for the adult dental clinic. Patient must be enrolled in GCCN (uninsured, 0-200% FPL, qualifying info).  Enroll in GCCN first, then see Primary Care Physician assigned to you, the PCP makes a dental referral. [facility] will receive referral and contacts patient for appointment.  Patients with Medicaid           [address], [phone]  [facility]Children up to 20 + Pregnant Women) - (336) [phone]  [facility] - [address] (336) [phone]  If unable to pay, or uninsured: contact [place] [facility] ([phone] in [place] - (Children only + Pregnant Women), [phone] in [place]- Children only) to become qualified for the adult dental clinic  Must see if eligible to enroll in ACA Health Insurance Marketplace before enrolling into the GCCN (exemption required) (1-855-733-3711 for an appointment)  www.healthcare.gov;   1-800-318-2596.  If not eligible for ACA, then go by Department of Health and Human Services to see if eligible for "orange card."  [address], [place] and [address]- [place].  Once you get an orange card, you will have a Primary Care home who will then refer you to dental if needed.     Other Low-Cost Community Dental Services:   [facility] - [phone] (ext [phone])   [address] [place] [address]  Dr. Civils - [phone]   [address]    [facility] - [phone]   [address]           [facility]           [address], [place], [place], [address]           [phone], Ext. [phone]           2nd and 4th Thursday of the month at 6:30am (Simple extractions only - no wisdom teeth or surgery) First come/First serve -First 10 clients served           [facility] ([place] and [place] residents only)          [address], [place], [place], [address]            [phone]                    [facility]           [phone]          [facility]          [phone]         [facility]          [phone]   Transportation Options:  Ambulance - 911 - $250-$700 per ride Family Member to accompany patient (if stable) - [place] [employer] - (336) 355-6499  PART - (336) 662-0002  Taxi - (336) [phone] - [place]  [facility] - (336) 333-6589 (Application required)  [place] [facility] - (336) [phone]  

## 2018-05-14 NOTE — Progress Notes (Signed)
GYNECOLOGY CLINIC ANNUAL PREVENTATIVE CARE ENCOUNTER NOTE  Subjective:   Colleen Terrell is a 37 y.o. [id number] female here for a routine annual gynecologic exam.  Current complaints: none.   Denies abnormal vaginal bleeding, discharge, pelvic pain, problems with intercourse or other gynecologic concerns.  CHTN. On meds but hasn't taken today. No symptoms. Has not scheduled f/u with PCP yet.    Gynecologic History No LMP recorded. Contraception: Depo-Provera injections Last Pap: 2015. Results were: normal Last mammogram: n/a. Will start next year. No family hx of breast cancer.   Obstetric History OB History  Gravida Para Term Preterm AB Living  4 4 3 1   6   SAB TAB Ectopic Multiple Live Births        2 6    # Outcome Date GA Lbr Len/2nd Weight Sex Delivery Anes PTL Lv  4A Preterm 03/19/18 8468w6d  5 lb 13.7 oz (2.655 kg) F CS-LTranv Spinal  LIV  4B Preterm 03/19/18 7068w6d  6 lb 4.2 oz (2.84 kg) M CS-LTranv Spinal  LIV  3A Term 09/13/14 1657w1d  7 lb 3 oz (3.26 kg) M CS-LTranv Spinal  LIV  3B Term 09/13/14 4957w1d  6 lb 7.7 oz (2.94 kg) F CS-LTranv Spinal  LIV  2 Term 02/02/12 7482w1d 03:10 / 00:23 9 lb 1.5 oz (4.125 kg) F Vag-Spont Local  LIV  1 Term 10/30/06 4915w0d 24:00 9 lb 3 oz (4.167 kg) M Vag-Spont EPI  LIV    Past Medical History:  Diagnosis Date  . Complication of anesthesia    pain with surgery  . GERD (gastroesophageal reflux disease)   . History of kidney infection during pregnancy   . Hypertension   . PP NVB 02/02/12 02/02/2012  . Unspecified hemorrhoids without mention of complication     Past Surgical History:  Procedure Laterality Date  . CESAREAN SECTION N/A 09/13/2014   Procedure: CESAREAN SECTION;  Surgeon: Levie HeritageJacob J Stinson, DO;  Location: [facility];  Service: Obstetrics;  Laterality: N/A;  . CESAREAN SECTION MULTI-GESTATIONAL N/A 03/19/2018   Procedure: REPEAT CESAREAN SECTION MULTI-GESTATIONAL;  Surgeon: Federico FlakeNewton, Kimberly Niles, MD;  [facility];  Service: Obstetrics;  Laterality: N/A;    Current Outpatient Medications on File Prior to Visit  Medication Sig Dispense Refill  . enalapril-hydrochlorothiazide (VASERETIC) 10-25 MG tablet Take 2 tablets by mouth daily. 60 tablet 1  . ferrous sulfate 325 (65 FE) MG tablet Take 1 tablet (325 mg total) by mouth 2 (two) times daily. 60 tablet 3  . medroxyPROGESTERone (DEPO-PROVERA) 150 MG/ML injection Inject 1 mL (150 mg total) into the muscle every 3 (three) months. 1 mL 0  . Prenatal Multivit-Min-Fe-FA (PRENATAL VITAMINS) 0.8 MG tablet Take 1 tablet by mouth daily. 30 tablet 12  . docusate sodium (COLACE) 100 MG capsule Take 1 capsule (100 mg total) by mouth 2 (two) times daily. (Patient not taking: Reported on 05/14/2018) 60 capsule 0   No current facility-administered medications on file prior to visit.     No Known Allergies  Social History   Socioeconomic History  . Marital status: Married    Spouse name: Not on file  . Number of children: Not on file  . Years of education: Not on file  . Highest education level: Not on file  Occupational History  . Not on file  Social Needs  . Financial resource strain: Not on file  . Food insecurity:    Worry: Not on file    Inability: Not on file  .  Transportation needs:    Medical: Not on file    Non-medical: Not on file  Tobacco Use  . Smoking status: Never Smoker  . Smokeless tobacco: Never Used  Substance and Sexual Activity  . Alcohol use: No  . Drug use: No  . Sexual activity: Not Currently    Birth control/protection: None  Lifestyle  . Physical activity:    Days per week: Not on file    Minutes per session: Not on file  . Stress: Not on file  Relationships  . Social connections:    Talks on phone: Not on file    Gets together: Not on file    Attends religious service: Not on file    Active member of club or organization: Not on file    Attends meetings of clubs or organizations: Not on file    Relationship  status: Not on file  . Intimate partner violence:    Fear of current or ex partner: Not on file    Emotionally abused: Not on file    Physically abused: Not on file    Forced sexual activity: Not on file  Other Topics Concern  . Not on file  Social History Narrative  . Not on file    Family History  Problem Relation Age of Onset  . Hypertension Father   . Hypertension Mother   . Anesthesia problems Neg Hx     The following portions of the patient's history were reviewed and updated as appropriate: allergies, current medications, past family history, past medical history, past social history, past surgical history and problem list.  Review of Systems Pertinent items are noted in HPI.   Objective:  BP (!) 163/92   Pulse 80   Wt 257 lb 3.2 oz (116.7 kg)   BMI 37.98 kg/m  CONSTITUTIONAL: Well-developed, well-nourished female in no acute distress.  HENT:  Normocephalic, atraumatic, External right and left ear normal. Oropharynx is clear and moist EYES: Conjunctivae and EOM are normal. Pupils are equal, round, and reactive to light. No scleral icterus.  NECK: Normal range of motion, supple, no masses.  Normal thyroid.  SKIN: Skin is warm and dry. No rash noted. Not diaphoretic. No erythema. No pallor. NEUROLGIC: Alert and oriented to person, place, and time. Normal reflexes, muscle tone coordination. No cranial nerve deficit noted. PSYCHIATRIC: Normal mood and affect. Normal behavior. Normal judgment and thought content. CARDIOVASCULAR: Normal heart rate noted, regular rhythm RESPIRATORY: Clear to auscultation bilaterally. Effort and breath sounds normal, no problems with respiration noted. BREASTS: Symmetric in size. No masses, skin changes, nipple drainage, or lymphadenopathy. ABDOMEN: Soft, normal bowel sounds, no distention noted.  No tenderness, rebound or guarding.  PELVIC: Normal appearing external genitalia; normal appearing vaginal mucosa and cervix.  No abnormal discharge  noted.  Pap smear obtained.  Normal uterine size, no other palpable masses, no uterine or adnexal tenderness. MUSCULOSKELETAL: Normal range of motion. No tenderness.  No cyanosis, clubbing, or edema.  2+ distal pulses.   Assessment:  Annual gynecologic examination with pap smear   Plan:  1. Encounter for well woman exam with routine gynecological exam -[facility] resources given for PCP. Stressed importance of taken meds & reasons to present to ED -Has appts scheduled for depo  - Cytology - PAP     Judeth Horn, NP

## 2018-05-16 LAB — CYTOLOGY - PAP
CHLAMYDIA, DNA PROBE: NEGATIVE
DIAGNOSIS: NEGATIVE
HPV (WINDOPATH): NOT DETECTED
Neisseria Gonorrhea: NEGATIVE

## 2018-05-20 ENCOUNTER — Telehealth: Payer: Self-pay | Admitting: Student

## 2018-05-20 NOTE — Telephone Encounter (Signed)
Verified by name & DOB. Notified of normal pap smear results.  Colleen Terrell, Colleen Manninen, NP

## 2018-06-17 ENCOUNTER — Encounter: Payer: Self-pay | Admitting: Family Medicine

## 2018-06-17 ENCOUNTER — Ambulatory Visit: Payer: Self-pay | Attending: Family Medicine | Admitting: Family Medicine

## 2018-06-17 ENCOUNTER — Other Ambulatory Visit: Payer: Self-pay

## 2018-06-17 VITALS — BP 124/84 | HR 90 | Temp 98.5°F | Resp 18 | Ht 69.0 in | Wt 259.6 lb

## 2018-06-17 DIAGNOSIS — Z8759 Personal history of other complications of pregnancy, childbirth and the puerperium: Secondary | ICD-10-CM

## 2018-06-17 DIAGNOSIS — K219 Gastro-esophageal reflux disease without esophagitis: Secondary | ICD-10-CM | POA: Insufficient documentation

## 2018-06-17 DIAGNOSIS — Z6838 Body mass index (BMI) 38.0-38.9, adult: Secondary | ICD-10-CM

## 2018-06-17 DIAGNOSIS — E669 Obesity, unspecified: Secondary | ICD-10-CM

## 2018-06-17 DIAGNOSIS — Z8249 Family history of ischemic heart disease and other diseases of the circulatory system: Secondary | ICD-10-CM | POA: Insufficient documentation

## 2018-06-17 DIAGNOSIS — R030 Elevated blood-pressure reading, without diagnosis of hypertension: Secondary | ICD-10-CM

## 2018-06-17 MED ORDER — AMLODIPINE BESYLATE 5 MG PO TABS: 5.0000 mg | ORAL_TABLET | Freq: Every day | ORAL | 3 refills | Status: DC

## 2018-06-17 NOTE — Progress Notes (Signed)
Subjective:    Patient ID: Colleen Terrell, female    DOB: Jan 04, 1981, 37 y.o.   MRN: [id number]  HPI 37 yo female new to the practice.  Patient reports a medical history significant for hypertension during pregnancy.  Patient states that in 2016 she became pregnant with twins and at 36 weeks she developed preeclampsia with and was placed on blood pressure medication.  Patient states that after the pregnancy, the hypertension resolved and she was no longer on medication.  Patient however then became pregnant again in 2018 and delivered on March 19, 2018.  Patient states that her blood pressure was good until about 30 weeks of pregnancy when she then developed elevated blood pressure.  Patient states that she was initially placed on labetalol however this medication was not working to control her blood pressure and then she was placed on enalapril-HCTZ 20-25 to take 2 pills daily.  Patient reports that she started having issues with dizziness and feeling weak and she reduce the dose to 1 pill daily.  Patient states that she normally takes a blood pressure medication at night but did not have any medication available to take last night.  Patient states that she is now completely out of the medication.  Patient states that if possible she does not wish to be on blood pressure medication.  Patient wants to know what other things she can do to help control her blood pressure.  Patient states that overall she feels well.  Patient is currently breast-feeding.  Patient states that she thinks that if she loses weight that she may be able to control her blood pressure better.  Patient does not monitor her blood pressure at home.  Patient does have a family history of her father having hypertension.  Patient does not drink or smoke.  Patient denies any issues with gestational diabetes. Past Medical History:  Diagnosis Date  . Complication of anesthesia    pain with surgery  . GERD (gastroesophageal reflux  disease)   . History of kidney infection during pregnancy   . Hypertension   . PP NVB 02/02/12 02/02/2012  . Unspecified hemorrhoids without mention of complication    Past Surgical History:  Procedure Laterality Date  . CESAREAN SECTION N/A 09/13/2014   Procedure: CESAREAN SECTION;  Surgeon: Levie Heritage, DO;  Location: [facility];  Service: Obstetrics;  Laterality: N/A;  . CESAREAN SECTION MULTI-GESTATIONAL N/A 03/19/2018   Procedure: REPEAT CESAREAN SECTION MULTI-GESTATIONAL;  Surgeon: Federico Flake, MD;  Location: [facility];  Service: Obstetrics;  Laterality: N/A;   Family History  Problem Relation Age of Onset  . Hypertension Father   . Hypertension Mother   . Anesthesia problems Neg Hx    Social History   Tobacco Use  . Smoking status: Never Smoker  . Smokeless tobacco: Never Used  Substance Use Topics  . Alcohol use: No  . Drug use: No  No Known Allergies  Review of Systems  Constitutional: Positive for fatigue (mild- currently breastfeeding). Negative for chills and fever.  Respiratory: Negative for cough and shortness of breath.   Cardiovascular: Negative for chest pain, palpitations and leg swelling.  Gastrointestinal: Negative for abdominal pain and nausea.  Genitourinary: Negative for dysuria, flank pain and frequency.  Musculoskeletal: Negative for arthralgias, back pain, gait problem and joint swelling.  Neurological: Negative for dizziness, light-headedness and headaches.       Objective:   Physical Exam BP 124/84 (BP Location: Left Arm, Cuff Size: Large)   Pulse  90   Temp 98.5 F (36.9 C) (Oral)   Resp 18   Ht 5\' 9"  (1.753 m)   Wt 259 lb 9.6 oz (117.8 kg)   SpO2 98%   BMI 38.34 kg/m Nurse's notes and vital signs reviewed; patient with initial blood pressure of 142/82 and on repeat, blood pressure was 124/84 after the exam General-well-nourished, well-developed obese female in no acute distress Neck-supple, no lymphadenopathy, no  thyromegaly Lungs-clear to auscultation Cardiovascular-regular rate and rhythm Abdomen-truncal obesity, soft and nontender Back-no CVA tenderness Extremities-no edema        Assessment & Plan:  1. Elevated blood pressure reading Patient's initial blood pressure was 142/85.  Patient states that if possible, she does not want to be on blood pressure medication.  Patient did have a lower blood pressure of 124/84 after the exam.  Discussed with the patient that she does have a higher risk of becoming hypertension due to her issues with preeclampsia/gestational hypertension.  Patient will be given information on a DASH diet as part of her after visit summary.  Discussed with the patient that 1 of the things that she can do is to decrease her salt intake and increase intake of fresh fruits and vegetables.  Patient states that she does the cooking for her family but does tend to use a lot of salt and I discussed using other spices to add flavor other than salt.  Patient agrees to return to clinic in 2 weeks to meet with clinical pharmacist and have her blood pressure rechecked.  Prescription was sent to patient's pharmacy for amlodipine 5 mg in case she has issues with elevated blood pressure in the meantime.  For now, patient may remain off of her blood pressure medication.  Patient does need to make dietary changes as well as to begin regular exercise with goal of weight loss.  2. History of gestational hypertension Patient reports a history of gestational hypertension and on review of her notes from her 6-week OB/GYN follow-up patient did have a blood pressure 163/92 but per the note, patient had stated that she had not taken her blood pressure medication prior to the visit.  Discussed with patient that she is at higher risk of developing future hypertension due to her gestational hypertension/preeclampsia.  3. Class 2 obesity with body mass index (BMI) of 38.0 to 38.9 in adult, unspecified obesity  type, unspecified whether serious comorbidity present Patient with a BMI of 38 and patient is encouraged to follow a low-fat/low carbohydrate diet along with regular daily exercise with eventual goal of 60 minutes of exercise weekly but for now, patient should start with 20 to 30 minutes of low impact exercise a few days a week and increase as tolerated.  Patient is also encouraged to make sure that she is taking her iron supplement as she did have anemia with a hemoglobin of 10.2 on 04/08/2018 at her 6-week OB/GYN follow-up.  *Patient will obtain influenza immunization today here at the office during flu shot clinic  An After Visit Summary was printed and given to the patient.  Return in about 6 months (around 12/17/2018) for Luke-2 weeks; 4-6 months with PCP.

## 2018-06-17 NOTE — Patient Instructions (Signed)
DASH Eating Plan DASH stands for "Dietary Approaches to Stop Hypertension." The DASH eating plan is a healthy eating plan that has been shown to reduce high blood pressure (hypertension). It may also reduce your risk for type 2 diabetes, heart disease, and stroke. The DASH eating plan may also help with weight loss. What are tips for following this plan? General guidelines  Avoid eating more than 2,300 mg (milligrams) of salt (sodium) a day. If you have hypertension, you may need to reduce your sodium intake to 1,500 mg a day.  Limit alcohol intake to no more than 1 drink a day for nonpregnant women and 2 drinks a day for men. One drink equals 12 oz of beer, 5 oz of wine, or 1 oz of hard liquor.  Work with your health care provider to maintain a healthy body weight or to lose weight. Ask what an ideal weight is for you.  Get at least 30 minutes of exercise that causes your heart to beat faster (aerobic exercise) most days of the week. Activities may include walking, swimming, or biking.  Work with your health care provider or diet and nutrition specialist (dietitian) to adjust your eating plan to your individual calorie needs. Reading food labels  Check food labels for the amount of sodium per serving. Choose foods with less than 5 percent of the Daily Value of sodium. Generally, foods with less than 300 mg of sodium per serving fit into this eating plan.  To find whole grains, look for the word "whole" as the first word in the ingredient list. Shopping  Buy products labeled as "low-sodium" or "no salt added."  Buy fresh foods. Avoid canned foods and premade or frozen meals. Cooking  Avoid adding salt when cooking. Use salt-free seasonings or herbs instead of table salt or sea salt. Check with your health care provider or pharmacist before using salt substitutes.  Do not fry foods. Cook foods using healthy methods such as baking, boiling, grilling, and broiling instead.  Cook with  heart-healthy oils, such as olive, canola, soybean, or sunflower oil. Meal planning   Eat a balanced diet that includes: ? 5 or more servings of fruits and vegetables each day. At each meal, try to fill half of your plate with fruits and vegetables. ? Up to 6-8 servings of whole grains each day. ? Less than 6 oz of lean meat, poultry, or fish each day. A 3-oz serving of meat is about the same size as a deck of cards. One egg equals 1 oz. ? 2 servings of low-fat dairy each day. ? A serving of nuts, seeds, or beans 5 times each week. ? Heart-healthy fats. Healthy fats called Omega-3 fatty acids are found in foods such as flaxseeds and coldwater fish, like sardines, salmon, and mackerel.  Limit how much you eat of the following: ? Canned or prepackaged foods. ? Food that is high in trans fat, such as fried foods. ? Food that is high in saturated fat, such as fatty meat. ? Sweets, desserts, sugary drinks, and other foods with added sugar. ? Full-fat dairy products.  Do not salt foods before eating.  Try to eat at least 2 vegetarian meals each week.  Eat more home-cooked food and less restaurant, buffet, and fast food.  When eating at a restaurant, ask that your food be prepared with less salt or no salt, if possible. What foods are recommended? The items listed may not be a complete list. Talk with your dietitian about what   dietary choices are best for you. Grains Whole-grain or whole-wheat bread. Whole-grain or whole-wheat pasta. Brown rice. Oatmeal. Quinoa. Bulgur. Whole-grain and low-sodium cereals. Pita bread. Low-fat, low-sodium crackers. Whole-wheat flour tortillas. Vegetables Fresh or frozen vegetables (raw, steamed, roasted, or grilled). Low-sodium or reduced-sodium tomato and vegetable juice. Low-sodium or reduced-sodium tomato sauce and tomato paste. Low-sodium or reduced-sodium canned vegetables. Fruits All fresh, dried, or frozen fruit. Canned fruit in natural juice (without  added sugar). Meat and other protein foods Skinless chicken or turkey. Ground chicken or turkey. Pork with fat trimmed off. Fish and seafood. Egg whites. Dried beans, peas, or lentils. Unsalted nuts, nut butters, and seeds. Unsalted canned beans. Lean cuts of beef with fat trimmed off. Low-sodium, lean deli meat. Dairy Low-fat (1%) or fat-free (skim) milk. Fat-free, low-fat, or reduced-fat cheeses. Nonfat, low-sodium ricotta or cottage cheese. Low-fat or nonfat yogurt. Low-fat, low-sodium cheese. Fats and oils Soft margarine without trans fats. Vegetable oil. Low-fat, reduced-fat, or light mayonnaise and salad dressings (reduced-sodium). Canola, safflower, olive, soybean, and sunflower oils. Avocado. Seasoning and other foods Herbs. Spices. Seasoning mixes without salt. Unsalted popcorn and pretzels. Fat-free sweets. What foods are not recommended? The items listed may not be a complete list. Talk with your dietitian about what dietary choices are best for you. Grains Baked goods made with fat, such as croissants, muffins, or some breads. Dry pasta or rice meal packs. Vegetables Creamed or fried vegetables. Vegetables in a cheese sauce. Regular canned vegetables (not low-sodium or reduced-sodium). Regular canned tomato sauce and paste (not low-sodium or reduced-sodium). Regular tomato and vegetable juice (not low-sodium or reduced-sodium). Pickles. Olives. Fruits Canned fruit in a light or heavy syrup. Fried fruit. Fruit in cream or butter sauce. Meat and other protein foods Fatty cuts of meat. Ribs. Fried meat. Bacon. Sausage. Bologna and other processed lunch meats. Salami. Fatback. Hotdogs. Bratwurst. Salted nuts and seeds. Canned beans with added salt. Canned or smoked fish. Whole eggs or egg yolks. Chicken or turkey with skin. Dairy Whole or 2% milk, cream, and half-and-half. Whole or full-fat cream cheese. Whole-fat or sweetened yogurt. Full-fat cheese. Nondairy creamers. Whipped toppings.  Processed cheese and cheese spreads. Fats and oils Butter. Stick margarine. Lard. Shortening. Ghee. Bacon fat. Tropical oils, such as coconut, palm kernel, or palm oil. Seasoning and other foods Salted popcorn and pretzels. Onion salt, garlic salt, seasoned salt, table salt, and sea salt. Worcestershire sauce. Tartar sauce. Barbecue sauce. Teriyaki sauce. Soy sauce, including reduced-sodium. Steak sauce. Canned and packaged gravies. Fish sauce. Oyster sauce. Cocktail sauce. Horseradish that you find on the shelf. Ketchup. Mustard. Meat flavorings and tenderizers. Bouillon cubes. Hot sauce and Tabasco sauce. Premade or packaged marinades. Premade or packaged taco seasonings. Relishes. Regular salad dressings. Where to find more information:  National Heart, Lung, and Blood Institute: www.nhlbi.nih.gov  American Heart Association: www.heart.org Summary  The DASH eating plan is a healthy eating plan that has been shown to reduce high blood pressure (hypertension). It may also reduce your risk for type 2 diabetes, heart disease, and stroke.  With the DASH eating plan, you should limit salt (sodium) intake to 2,300 mg a day. If you have hypertension, you may need to reduce your sodium intake to 1,500 mg a day.  When on the DASH eating plan, aim to eat more fresh fruits and vegetables, whole grains, lean proteins, low-fat dairy, and heart-healthy fats.  Work with your health care provider or diet and nutrition specialist (dietitian) to adjust your eating plan to your individual   calorie needs. This information is not intended to replace advice given to you by your health care provider. Make sure you discuss any questions you have with your health care provider. Document Released: 08/02/2011 Document Revised: 08/06/2016 Document Reviewed: 08/06/2016 Elsevier Interactive Patient Education  2018 Elsevier Inc.  

## 2018-06-17 NOTE — Progress Notes (Signed)
Flu shot: yes   Pain: 0  bp main concern

## 2018-07-02 NOTE — Progress Notes (Deleted)
   S:    PCP: Dr. Jillyn Hidden  Patient arrives ***.  Presents to the clinic for hypertension management. Patient was referred and last seen by Dr. Jillyn Hidden on 06/17/18. BP at that visit 142/85 initially. 124/84 after exam. Amlodipine 5 mg daily recommended but pt did not want to start medication at that visit.  Patient {Actions; denies-reports:120008} adherence with medications.  Current BP Medications include:   - none   Antihypertensives tried in the past include:  - Enalapril-HCTZ 10-25 mg daily  Dietary habits include: *** Exercise habits include:*** Family / Social history:  - FHx: HTN (mother and father) - Tobacco: never smoker *** - Alcohol: denies ***  Home BP readings:  - ***  O:  L arm after 5 minutes rest: ***, HR ***  Last 3 Office BP readings: BP Readings from Last 3 Encounters:  06/17/18 124/84  05/14/18 (!) 163/92  04/24/18 132/83    BMET    Component Value Date/Time   NA 135 03/29/2018 1957   NA 139 02/19/2018 1047   K 3.8 03/29/2018 1957   CL 101 03/29/2018 1957   CO2 24 03/29/2018 1957   GLUCOSE 117 (H) 03/29/2018 1957   BUN 16 03/29/2018 1957   BUN 5 (L) 02/19/2018 1047   CREATININE 0.95 03/29/2018 1957   CREATININE 0.67 09/02/2014 1505   CALCIUM 8.4 (L) 03/29/2018 1957   GFRNONAA >60 03/29/2018 1957   GFRAA >60 03/29/2018 1957    Renal function: CrCl cannot be calculated (Patient's most recent lab result is older than the maximum 21 days allowed.).  A/P: Hypertension longstanding/newly diagnosed currently *** on current medications. BP Goal = *** mmHg. Patient {Is/is not:9024} adherent with current medications.  -{Meds adjust:18428} ***.  -F/u labs ordered - *** -Counseled on lifestyle modifications for blood pressure control including reduced dietary sodium, increased exercise, adequate sleep -HM: up-to-date with flu, tetanus; not a candidate for PNA at this time  Results reviewed and written information provided.   Total time in face-to-face  counseling *** minutes.   F/U Clinic Visit in ***.  Patient seen with ***

## 2018-07-03 ENCOUNTER — Ambulatory Visit: Payer: Medicaid Other | Admitting: Pharmacist

## 2018-07-04 ENCOUNTER — Encounter: Payer: Self-pay | Admitting: Pharmacist

## 2018-07-04 ENCOUNTER — Ambulatory Visit: Payer: Self-pay | Attending: Family Medicine | Admitting: Pharmacist

## 2018-07-04 VITALS — BP 128/88 | HR 76

## 2018-07-04 DIAGNOSIS — I10 Essential (primary) hypertension: Secondary | ICD-10-CM

## 2018-07-04 DIAGNOSIS — Z79899 Other long term (current) drug therapy: Secondary | ICD-10-CM | POA: Insufficient documentation

## 2018-07-04 DIAGNOSIS — Z8249 Family history of ischemic heart disease and other diseases of the circulatory system: Secondary | ICD-10-CM | POA: Insufficient documentation

## 2018-07-04 NOTE — Patient Instructions (Signed)
Thank you for coming to see us today.   Blood pressure today is improving.  Continue taking blood pressure medications as prescribed.   Limiting salt and caffeine, as well as exercising as able for at least 30 minutes for 5 days out of the week, can also help you lower your blood pressure.  Take your blood pressure at home if you are able. Please write down these numbers and bring them to your visits.  If you have any questions about medications, please call me [phone].  Luke  

## 2018-07-04 NOTE — Progress Notes (Signed)
   S:    PCP: Dr. Jillyn Hidden  Patient arrives in good spirits. Presents to the clinic for hypertension management. Patient was referred and last seen by Dr. Jillyn Hidden on 06/17/18. BP at that visit 142/85 initially. 124/84 after exam. Amlodipine 5 mg daily recommended but pt did not want to start.  Today, pt denies CP, SOB, HA, or blurred vision.  Patient reports adherence with medications. She reports that after initially declining amlodipine, she had it filled after checking her BP at [facility].   Current BP Medications include:   - amlodipine 5 mg.   Antihypertensives tried in the past include:  - Enalapril-HCTZ 10-25 mg daily  Dietary habits include:  - Does not limit salt - Does not limit caffeine Exercise habits include: - Denies Family / Social history:  - FHx: HTN (mother and father) - Tobacco: never smoker - Alcohol: denies   Home BP readings:  - since addition of amlodipine: 140s/80s  O:  L arm after 5 minutes rest: 128/88, HR 76  Last 3 Office BP readings: BP Readings from Last 3 Encounters:  06/17/18 124/84  05/14/18 (!) 163/92  04/24/18 132/83   BMET    Component Value Date/Time   NA 135 03/29/2018 1957   NA 139 02/19/2018 1047   K 3.8 03/29/2018 1957   CL 101 03/29/2018 1957   CO2 24 03/29/2018 1957   GLUCOSE 117 (H) 03/29/2018 1957   BUN 16 03/29/2018 1957   BUN 5 (L) 02/19/2018 1047   CREATININE 0.95 03/29/2018 1957   CREATININE 0.67 09/02/2014 1505   CALCIUM 8.4 (L) 03/29/2018 1957   GFRNONAA >60 03/29/2018 1957   GFRAA >60 03/29/2018 1957   Renal function: CrCl cannot be calculated (Patient's most recent lab result is older than the maximum 21 days allowed.).  A/P: Hypertension longstanding currently uncontrolled but improved on amlodipine 5 mg. BP Goal <130/80 mmHg. Pt does not want to increase amlodipine dose at this time. Will continue amlodipine and reassess for dose increase in 2 weeks. -Continued amlodipine 5 mg daily.  -Counseled on lifestyle  modifications for blood pressure control including reduced dietary sodium, increased exercise, adequate sleep  Results reviewed and written information provided.   Total time in face-to-face counseling 15 minutes.   F/U Clinic Visit in 07/18/18.    Patient seen with: Leanne Chang, PharmD Candidate [employer] of 2021  Butch Penny, PharmD, CPP Clinical Pharmacist [facility] [phone]

## 2018-07-10 ENCOUNTER — Ambulatory Visit (INDEPENDENT_AMBULATORY_CARE_PROVIDER_SITE_OTHER): Payer: Medicaid Other | Admitting: General Practice

## 2018-07-10 VITALS — BP 136/93 | HR 80 | Ht 69.0 in | Wt 254.0 lb

## 2018-07-10 DIAGNOSIS — Z3042 Encounter for surveillance of injectable contraceptive: Secondary | ICD-10-CM

## 2018-07-10 MED ORDER — MEDROXYPROGESTERONE ACETATE 150 MG/ML IM SUSP
150.0000 mg | Freq: Once | INTRAMUSCULAR | Status: AC
  Administered 2018-07-10: 150 mg via INTRAMUSCULAR

## 2018-07-10 NOTE — Progress Notes (Signed)
I agree with the RN's note and documentation.   Duane Lopeasch, Yogesh Cominsky I, NP 07/10/2018 4:28 PM

## 2018-07-10 NOTE — Progress Notes (Signed)
Colleen Terrell here for Depo-Provera  Injection.  Injection administered without complication. Patient will return in 3 months for next injection. Patient brought depo-provera dose from home as Rx was accidentally sent to pharmacy.  Marylynn Pearsonarrie Hillman, RN 07/10/2018  10:50 AM

## 2018-07-15 ENCOUNTER — Ambulatory Visit: Payer: Medicaid Other

## 2018-07-16 ENCOUNTER — Ambulatory Visit: Payer: Medicaid Other

## 2018-07-18 ENCOUNTER — Ambulatory Visit: Payer: Medicaid Other | Admitting: Pharmacist

## 2018-07-31 ENCOUNTER — Ambulatory Visit: Payer: Self-pay | Attending: Family Medicine | Admitting: Pharmacist

## 2018-07-31 ENCOUNTER — Encounter: Payer: Self-pay | Admitting: Pharmacist

## 2018-07-31 VITALS — BP 142/84 | HR 80

## 2018-07-31 DIAGNOSIS — I10 Essential (primary) hypertension: Secondary | ICD-10-CM | POA: Insufficient documentation

## 2018-07-31 DIAGNOSIS — R030 Elevated blood-pressure reading, without diagnosis of hypertension: Secondary | ICD-10-CM

## 2018-07-31 MED ORDER — AMLODIPINE BESYLATE 10 MG PO TABS: 10.0000 mg | ORAL_TABLET | Freq: Every day | ORAL | 2 refills | Status: DC

## 2018-07-31 NOTE — Progress Notes (Signed)
    S:    PCP: Dr. Jillyn HiddenFulp  Patient arrives in good spirits. Presents to the clinic for hypertension management. Patient was referred and last seen by Dr. Jillyn HiddenFulp on 06/17/18. I last saw her 07/04/18. BP had improved and patient elected not to increase her amlodipine.   Today, pt denies CP, SOB, HA, or blurred vision.  Patient reports adherence with medications.   Current BP Medications include:   - amlodipine 5 mg.   Antihypertensives tried in the past include:  - Enalapril-HCTZ 10-25 mg daily  Dietary habits include:  - Does not limit salt - Does not limit caffeine Exercise habits include: - Denies Family / Social history:  - FHx: HTN (mother and father) - Tobacco: never smoker - Alcohol: denies   Home BP readings:  - Reports 140s/80s  O:  L arm after 5 minutes rest: 142/84 , HR 80  Last 3 Office BP readings: BP Readings from Last 3 Encounters:  07/31/18 (!) 142/84  07/10/18 (!) 136/93  07/04/18 128/88   BMET    Component Value Date/Time   NA 135 03/29/2018 1957   NA 139 02/19/2018 1047   K 3.8 03/29/2018 1957   CL 101 03/29/2018 1957   CO2 24 03/29/2018 1957   GLUCOSE 117 (H) 03/29/2018 1957   BUN 16 03/29/2018 1957   BUN 5 (L) 02/19/2018 1047   CREATININE 0.95 03/29/2018 1957   CREATININE 0.67 09/02/2014 1505   CALCIUM 8.4 (L) 03/29/2018 1957   GFRNONAA >60 03/29/2018 1957   GFRAA >60 03/29/2018 1957   Renal function: CrCl cannot be calculated (Patient's most recent lab result is older than the maximum 21 days allowed.).  A/P: Hypertension longstanding currently uncontrolled but improved on amlodipine 5 mg. BP Goal <130/80 mmHg. Pt does not want to increase amlodipine dose at this time. Will continue amlodipine and reassess for dose increase in 2 weeks. -Continued amlodipine 5 mg daily.  -Counseled on lifestyle modifications for blood pressure control including reduced dietary sodium, increased exercise, adequate sleep -Lipid panel  Results reviewed and  written information provided. Total time in face-to-face counseling 15 minutes.   F/U Clinic Visit 09/01/2018.  Butch PennyLuke Van Ausdall, PharmD, CPP Clinical Pharmacist [facility] [phone]

## 2018-07-31 NOTE — Patient Instructions (Signed)
Thank you for coming to see us today.   Blood pressure today is elevated.  Start taking amlodipine 10 mg daily.  Limiting salt and caffeine, as well as exercising as able for at least 30 minutes for 5 days out of the week, can also help you lower your blood pressure.  Take your blood pressure at home if you are able. Please write down these numbers and bring them to your visits.  If you have any questions about medications, please call me [phone].  Colleen Terrell

## 2018-08-01 LAB — LIPID PANEL
Chol/HDL Ratio: 3.8 ratio (ref 0.0–4.4)
Cholesterol, Total: 137 mg/dL (ref 100–199)
HDL: 36 mg/dL — ABNORMAL LOW
LDL Calculated: 86 mg/dL (ref 0–99)
Triglycerides: 76 mg/dL (ref 0–149)
VLDL Cholesterol Cal: 15 mg/dL (ref 5–40)

## 2018-08-07 ENCOUNTER — Ambulatory Visit: Payer: Self-pay | Attending: Family Medicine

## 2018-08-18 ENCOUNTER — Telehealth: Payer: Self-pay | Admitting: Family Medicine

## 2018-08-18 NOTE — Telephone Encounter (Signed)
Patient called to see if you got her email she said she emailed some files to your email. Please follow up with patient.

## 2018-08-21 NOTE — Telephone Encounter (Signed)
Patient verified DOB Patient is aware of cholesterol being good and needing to take OTC omega 3 to keep it there. No further questions.

## 2018-08-21 NOTE — Telephone Encounter (Signed)
-----   Message from Cain Saupeammie Fulp, MD sent at 08/13/2018  9:54 AM EST ----- Please notify patient that her lipid panel is within normal with the exception of low HDL/good cholesterol level of 36.  Her goal HDL should be 40 or higher.  Patient can increase foods rich in omega-3 fatty acids in her diet such as cold water fish such as salmon and/or take an omega-3 fatty acid supplement and recheck lipids in 12 months

## 2018-09-01 ENCOUNTER — Ambulatory Visit: Payer: Self-pay | Attending: Family Medicine | Admitting: Pharmacist

## 2018-09-01 ENCOUNTER — Encounter: Payer: Self-pay | Admitting: Pharmacist

## 2018-09-01 VITALS — BP 128/82 | HR 78

## 2018-09-01 DIAGNOSIS — Z8249 Family history of ischemic heart disease and other diseases of the circulatory system: Secondary | ICD-10-CM | POA: Insufficient documentation

## 2018-09-01 DIAGNOSIS — I10 Essential (primary) hypertension: Secondary | ICD-10-CM | POA: Insufficient documentation

## 2018-09-01 MED ORDER — AMLODIPINE BESYLATE 10 MG PO TABS: 10.0000 mg | ORAL_TABLET | Freq: Every day | ORAL | 2 refills | Status: DC

## 2018-09-01 MED FILL — AMLODIPINE BESYLATE 10 MG T: 10 | 30 days supply | Qty: 30 | Fill #0

## 2018-09-01 NOTE — Progress Notes (Signed)
    S:    PCP: Dr. Jillyn Hidden  Patient arrives in good spirits. Presents to the clinic for hypertension management. She was referred and last seen by Dr. Jillyn Hidden on 06/17/18. I last saw her 07/04/18. BP was improved at that visit, and the patient elected not to increase her amlodipine.   Today, pt denies chest pain, shortness of breath, HA, or blurred vision.  Patient reports adherence with medications. Reports missing ~3 doses/month. Takes at night and forgot last night's dose.   Current BP Medications include:   - Amlodipine 10 mg   Antihypertensives tried in the past include:  - Enalapril-HCTZ 10-25 mg daily  Dietary habits include:  - Does not limit salt - Denies drinking caffeine  Exercise habits include: - Denies  Family / Social history:  - FHx: HTN (mother and father) - Tobacco: never smoker - Alcohol: denies   Home BP readings:  - has not checked at home since seeing me  O:  L arm after 5 minutes rest: 128/82, HR 78  Last 3 Office BP readings: BP Readings from Last 3 Encounters:  07/31/18 (!) 142/84  07/10/18 (!) 136/93  07/04/18 128/88   BMET    Component Value Date/Time   NA 135 03/29/2018 1957   NA 139 02/19/2018 1047   K 3.8 03/29/2018 1957   CL 101 03/29/2018 1957   CO2 24 03/29/2018 1957   GLUCOSE 117 (H) 03/29/2018 1957   BUN 16 03/29/2018 1957   BUN 5 (L) 02/19/2018 1047   CREATININE 0.95 03/29/2018 1957   CREATININE 0.67 09/02/2014 1505   CALCIUM 8.4 (L) 03/29/2018 1957   GFRNONAA >60 03/29/2018 1957   GFRAA >60 03/29/2018 1957   Renal function: CrCl cannot be calculated (Patient's most recent lab result is older than the maximum 21 days allowed.).  A/P: Hypertension longstanding currently above goal but improved on amlodipine 10 mg. BP Goal <130/80 mmHg. She is very close to goal and missed last night's dose. We will continue current regimen for now.  -Continued amlodipine 10mg .   -Counseled on lifestyle modifications for blood pressure control  including reduced dietary sodium, increased exercise, adequate sleep  Results reviewed and written information provided. Total time in face-to-face counseling 15 minutes.   F/U w/ PCP 10/16/2018.  Patient seen with:  Arletha Pili, PharmD Candidate  [employer]  Class of 2022  Butch Penny, PharmD, CPP Clinical Pharmacist [facility] [phone]

## 2018-09-01 NOTE — Patient Instructions (Signed)
Thank you for coming to see [place] today.   Blood pressure today looks much better!  Continue taking blood pressure medications as prescribed.   Limiting salt and caffeine, as well as exercising as able for at least 30 minutes for 5 days out of the week, can also help you lower your blood pressure.  Take your blood pressure at home if you are able. Please write down these numbers and bring them to your visits.  If you have any questions about medications, please call me [phone].  Franky Macho

## 2018-09-25 ENCOUNTER — Ambulatory Visit: Payer: Medicaid Other

## 2018-10-16 ENCOUNTER — Ambulatory Visit: Payer: Medicaid Other | Admitting: Family Medicine

## 2018-10-24 ENCOUNTER — Encounter: Payer: Self-pay | Admitting: Family Medicine

## 2018-10-24 ENCOUNTER — Ambulatory Visit: Payer: Self-pay | Attending: Critical Care Medicine | Admitting: Family Medicine

## 2018-10-24 VITALS — BP 138/85 | HR 84 | Temp 98.9°F | Resp 18 | Ht 67.0 in

## 2018-10-24 DIAGNOSIS — R7309 Other abnormal glucose: Secondary | ICD-10-CM

## 2018-10-24 DIAGNOSIS — Z8249 Family history of ischemic heart disease and other diseases of the circulatory system: Secondary | ICD-10-CM | POA: Insufficient documentation

## 2018-10-24 DIAGNOSIS — Z79899 Other long term (current) drug therapy: Secondary | ICD-10-CM

## 2018-10-24 DIAGNOSIS — E669 Obesity, unspecified: Secondary | ICD-10-CM

## 2018-10-24 DIAGNOSIS — D649 Anemia, unspecified: Secondary | ICD-10-CM

## 2018-10-24 DIAGNOSIS — R739 Hyperglycemia, unspecified: Secondary | ICD-10-CM

## 2018-10-24 DIAGNOSIS — K219 Gastro-esophageal reflux disease without esophagitis: Secondary | ICD-10-CM | POA: Insufficient documentation

## 2018-10-24 DIAGNOSIS — Z6838 Body mass index (BMI) 38.0-38.9, adult: Secondary | ICD-10-CM

## 2018-10-24 DIAGNOSIS — I10 Essential (primary) hypertension: Secondary | ICD-10-CM

## 2018-10-24 MED ORDER — AMLODIPINE BESYLATE 10 MG PO TABS: 10.0000 mg | ORAL_TABLET | Freq: Every day | ORAL | 1 refills | Status: DC

## 2018-10-24 NOTE — Patient Instructions (Signed)
DASH Eating Plan  DASH stands for "Dietary Approaches to Stop Hypertension." The DASH eating plan is a healthy eating plan that has been shown to reduce high blood pressure (hypertension). It may also reduce your risk for type 2 diabetes, heart disease, and stroke. The DASH eating plan may also help with weight loss.  What are tips for following this plan?    General guidelines   Avoid eating more than 2,300 mg (milligrams) of salt (sodium) a day. If you have hypertension, you may need to reduce your sodium intake to 1,500 mg a day.   Limit alcohol intake to no more than 1 drink a day for nonpregnant women and 2 drinks a day for men. One drink equals 12 oz of beer, 5 oz of wine, or 1 oz of hard liquor.   Work with your health care provider to maintain a healthy body weight or to lose weight. Ask what an ideal weight is for you.   Get at least 30 minutes of exercise that causes your heart to beat faster (aerobic exercise) most days of the week. Activities may include walking, swimming, or biking.   Work with your health care provider or diet and nutrition specialist (dietitian) to adjust your eating plan to your individual calorie needs.  Reading food labels     Check food labels for the amount of sodium per serving. Choose foods with less than 5 percent of the Daily Value of sodium. Generally, foods with less than 300 mg of sodium per serving fit into this eating plan.   To find whole grains, look for the word "whole" as the first word in the ingredient list.  Shopping   Buy products labeled as "low-sodium" or "no salt added."   Buy fresh foods. Avoid canned foods and premade or frozen meals.  Cooking   Avoid adding salt when cooking. Use salt-free seasonings or herbs instead of table salt or sea salt. Check with your health care provider or pharmacist before using salt substitutes.   Do not fry foods. Cook foods using healthy methods such as baking, boiling, grilling, and broiling instead.   Cook with  heart-healthy oils, such as olive, canola, soybean, or sunflower oil.  Meal planning   Eat a balanced diet that includes:  ? 5 or more servings of fruits and vegetables each day. At each meal, try to fill half of your plate with fruits and vegetables.  ? Up to 6-8 servings of whole grains each day.  ? Less than 6 oz of lean meat, poultry, or fish each day. A 3-oz serving of meat is about the same size as a deck of cards. One egg equals 1 oz.  ? 2 servings of low-fat dairy each day.  ? A serving of nuts, seeds, or beans 5 times each week.  ? Heart-healthy fats. Healthy fats called Omega-3 fatty acids are found in foods such as flaxseeds and coldwater fish, like sardines, salmon, and mackerel.   Limit how much you eat of the following:  ? Canned or prepackaged foods.  ? Food that is high in trans fat, such as fried foods.  ? Food that is high in saturated fat, such as fatty meat.  ? Sweets, desserts, sugary drinks, and other foods with added sugar.  ? Full-fat dairy products.   Do not salt foods before eating.   Try to eat at least 2 vegetarian meals each week.   Eat more home-cooked food and less restaurant, buffet, and fast food.     When eating at a restaurant, ask that your food be prepared with less salt or no salt, if possible.  What foods are recommended?  The items listed may not be a complete list. Talk with your dietitian about what dietary choices are best for you.  Grains  Whole-grain or whole-wheat bread. Whole-grain or whole-wheat pasta. Brown rice. Oatmeal. Quinoa. Bulgur. Whole-grain and low-sodium cereals. Pita bread. Low-fat, low-sodium crackers. Whole-wheat flour tortillas.  Vegetables  Fresh or frozen vegetables (raw, steamed, roasted, or grilled). Low-sodium or reduced-sodium tomato and vegetable juice. Low-sodium or reduced-sodium tomato sauce and tomato paste. Low-sodium or reduced-sodium canned vegetables.  Fruits  All fresh, dried, or frozen fruit. Canned fruit in natural juice (without  added sugar).  Meat and other protein foods  Skinless chicken or turkey. Ground chicken or turkey. Pork with fat trimmed off. Fish and seafood. Egg whites. Dried beans, peas, or lentils. Unsalted nuts, nut butters, and seeds. Unsalted canned beans. Lean cuts of beef with fat trimmed off. Low-sodium, lean deli meat.  Dairy  Low-fat (1%) or fat-free (skim) milk. Fat-free, low-fat, or reduced-fat cheeses. Nonfat, low-sodium ricotta or cottage cheese. Low-fat or nonfat yogurt. Low-fat, low-sodium cheese.  Fats and oils  Soft margarine without trans fats. Vegetable oil. Low-fat, reduced-fat, or light mayonnaise and salad dressings (reduced-sodium). Canola, safflower, olive, soybean, and sunflower oils. Avocado.  Seasoning and other foods  Herbs. Spices. Seasoning mixes without salt. Unsalted popcorn and pretzels. Fat-free sweets.  What foods are not recommended?  The items listed may not be a complete list. Talk with your dietitian about what dietary choices are best for you.  Grains  Baked goods made with fat, such as croissants, muffins, or some breads. Dry pasta or rice meal packs.  Vegetables  Creamed or fried vegetables. Vegetables in a cheese sauce. Regular canned vegetables (not low-sodium or reduced-sodium). Regular canned tomato sauce and paste (not low-sodium or reduced-sodium). Regular tomato and vegetable juice (not low-sodium or reduced-sodium). Pickles. Olives.  Fruits  Canned fruit in a light or heavy syrup. Fried fruit. Fruit in cream or butter sauce.  Meat and other protein foods  Fatty cuts of meat. Ribs. Fried meat. Bacon. Sausage. Bologna and other processed lunch meats. Salami. Fatback. Hotdogs. Bratwurst. Salted nuts and seeds. Canned beans with added salt. Canned or smoked fish. Whole eggs or egg yolks. Chicken or turkey with skin.  Dairy  Whole or 2% milk, cream, and half-and-half. Whole or full-fat cream cheese. Whole-fat or sweetened yogurt. Full-fat cheese. Nondairy creamers. Whipped toppings.  Processed cheese and cheese spreads.  Fats and oils  Butter. Stick margarine. Lard. Shortening. Ghee. Bacon fat. Tropical oils, such as coconut, palm kernel, or palm oil.  Seasoning and other foods  Salted popcorn and pretzels. Onion salt, garlic salt, seasoned salt, table salt, and sea salt. Worcestershire sauce. Tartar sauce. Barbecue sauce. Teriyaki sauce. Soy sauce, including reduced-sodium. Steak sauce. Canned and packaged gravies. Fish sauce. Oyster sauce. Cocktail sauce. Horseradish that you find on the shelf. Ketchup. Mustard. Meat flavorings and tenderizers. Bouillon cubes. Hot sauce and Tabasco sauce. Premade or packaged marinades. Premade or packaged taco seasonings. Relishes. Regular salad dressings.  Where to find more information:   National Heart, Lung, and Blood Institute: www.nhlbi.nih.gov   American Heart Association: www.heart.org  Summary   The DASH eating plan is a healthy eating plan that has been shown to reduce high blood pressure (hypertension). It may also reduce your risk for type 2 diabetes, heart disease, and stroke.   With the   DASH eating plan, you should limit salt (sodium) intake to 2,300 mg a day. If you have hypertension, you may need to reduce your sodium intake to 1,500 mg a day.   When on the DASH eating plan, aim to eat more fresh fruits and vegetables, whole grains, lean proteins, low-fat dairy, and heart-healthy fats.   Work with your health care provider or diet and nutrition specialist (dietitian) to adjust your eating plan to your individual calorie needs.  This information is not intended to replace advice given to you by your health care provider. Make sure you discuss any questions you have with your health care provider.  Document Released: 08/02/2011 Document Revised: 08/06/2016 Document Reviewed: 08/06/2016  Elsevier Interactive Patient Education  2019 Elsevier Inc.

## 2018-10-24 NOTE — Progress Notes (Signed)
Subjective:    Patient ID: Colleen Terrell, female    DOB: 05-04-1981, 38 y.o.   MRN: [id number]  HPI       38 year old female who is seen in follow-up of hypertension.  Patient was here translating for her sister who is a new patient and patient asked to be worked into the schedule in follow-up of her hypertension.  Patient reports that she feels her blood pressure has been well controlled on amlodipine 10 mg on which she was recently started by the clinical pharmacist with whom she met on 07/31/2018 and 09/01/2018 in follow-up of her hypertension.  Patient denies any headaches or dizziness related to her blood pressure.  Patient has some mild fatigue.  Patient is 7 months status post the birth of her second set of twins who are with her at today's visit.  Patient has also had some anemia and is currently taking ferrous sulfate.  Overall, patient states that she feels well.  Patient is currently breast-feeding and has not resumed a regular exercise routine.  Patient denies any issues with elevated blood pressure or gestational diabetes during any of her pregnancies.  Patient however did have elevated glucose of 117 on blood work done on 03/29/2018.  Patient denies any increased thirst, no urinary frequency and no blurred vision.  Past Medical History:  Diagnosis Date  . Complication of anesthesia    pain with surgery  . GERD (gastroesophageal reflux disease)   . History of kidney infection during pregnancy   . Hypertension   . PP NVB 02/02/12 02/02/2012  . Unspecified hemorrhoids without mention of complication    Past Surgical History:  Procedure Laterality Date  . CESAREAN SECTION N/A 09/13/2014   Procedure: CESAREAN SECTION;  Surgeon: Truett Mainland, DO;  Location: [facility];  Service: Obstetrics;  Laterality: N/A;  . CESAREAN SECTION MULTI-GESTATIONAL N/A 03/19/2018   Procedure: REPEAT CESAREAN SECTION MULTI-GESTATIONAL;  Surgeon: Caren Macadam, MD;  Location: [facility];   Service: Obstetrics;  Laterality: N/A;   Family History  Problem Relation Age of Onset  . Hypertension Father   . Hypertension Mother   . Anesthesia problems Neg Hx    Social History   Tobacco Use  . Smoking status: Never Smoker  . Smokeless tobacco: Never Used  Substance Use Topics  . Alcohol use: No  . Drug use: No  No Known Allergies    Review of Systems  Constitutional: Positive for fatigue (mild). Negative for chills and fever.  HENT: Negative for sore throat and trouble swallowing.   Eyes: Negative for photophobia and visual disturbance.  Respiratory: Negative for cough and shortness of breath.   Cardiovascular: Negative for chest pain, palpitations and leg swelling.  Gastrointestinal: Negative for abdominal pain, constipation, diarrhea and nausea.  Endocrine: Negative for polydipsia, polyphagia and polyuria.  Genitourinary: Negative for dysuria and frequency.  Musculoskeletal: Negative for back pain and gait problem.  Neurological: Negative for dizziness and headaches.  Hematological: Negative for adenopathy. Does not bruise/bleed easily.       Objective:   Physical Exam BP 138/85 (BP Location: Left Arm, Patient Position: Sitting, Cuff Size: Large)   Pulse 84   Temp 98.9 F (37.2 C) (Oral)   Resp 18   Ht 5' 7"  (1.702 m)   SpO2 100%   BMI 39.78 kg/m Nurse's notes and vital signs reviewed  General-well-nourished, well-developed obese female in no acute distress.  Patient is accompanied by her sister as well as by her 6-monthold twins at  today's visit Neck-supple, no lymphadenopathy Cardiovascular-regular rate and rhythm Lungs-clear to auscultation bilaterally, breathing is nonlabored Abdomen-mild truncal obesity, soft and nontender Back-no CVA tenderness Extremities-no edema Psych-normal mood and judgment       Assessment & Plan:  1. Essential hypertension  Patient's blood pressure appears to be stable and well-controlled on her current amlodipine  which she will continue a new prescription provided for refill of amlodipine 10 mg.  Patient will also have BMP in follow-up of use of blood pressure medication and to follow-up on renal function due to high blood pressure.  Patient is currently on amlodipine 10 mg and is also breast-feeding.  I discussed this with the clinical pharmacist to see if there were any contraindications to the use of this medication while lactating and pharmacist has reviewed studies and there is no significant evidence to support any need to discontinue the medication due to patient currently breast-feeding. - Basic Metabolic Panel - amLODipine (NORVASC) 10 MG tablet; Take 1 tablet (10 mg total) by mouth daily. To lower blood pressure  Dispense: 90 tablet; Refill: 1  2. Class 2 obesity with body mass index (BMI) of 38.0 to 38.9 in adult, unspecified obesity type, unspecified whether serious comorbidity present 3. Elevated random blood glucose level Discussed with the patient is that she has prior random elevated glucose of 117 and due to her obesity, she will have hemoglobin A1c and BMP in follow-up to see if she may be diabetic or have an increased risk for becoming diabetic.  Patient is encouraged to adopt a low carbohydrate diet in addition to Dash type diet for treatment of hypertension.  Patient is also encouraged to start a regular low impact exercise program such as walking to help lose weight which will help reduce the risk of diabetes and should also help with patient's hypertension. - Basic Metabolic Panel - Hemoglobin A1c  4. Encounter for long-term (current) use of medications Patient will have basic metabolic panel in follow-up of her long-term use of medications for the treatment of hypertension - Basic Metabolic Panel  5. Anemia, unspecified type Patient with a history of anemia for which CBC will be done at today's visit.  Patient is currently taking ferrous sulfate and patient will be notified if a change  is needed in the dose or quantity of this medication based on lab results. - CBC with Differential  An After Visit Summary was printed and given to the patient.  Return in about 6 months (around 04/24/2019) for HTN/anemia.

## 2018-10-25 ENCOUNTER — Encounter: Payer: Self-pay | Admitting: Family Medicine

## 2018-10-25 LAB — CBC WITH DIFFERENTIAL/PLATELET
Basophils Absolute: 0 x10E3/uL (ref 0.0–0.2)
Basos: 0 %
EOS (ABSOLUTE): 0.3 x10E3/uL (ref 0.0–0.4)
Eos: 3 %
Hematocrit: 40.3 % (ref 34.0–46.6)
Hemoglobin: 12.9 g/dL (ref 11.1–15.9)
Immature Grans (Abs): 0 x10E3/uL (ref 0.0–0.1)
Immature Granulocytes: 0 %
Lymphocytes Absolute: 3.4 x10E3/uL — ABNORMAL HIGH (ref 0.7–3.1)
Lymphs: 44 %
MCH: 27.2 pg (ref 26.6–33.0)
MCHC: 32 g/dL (ref 31.5–35.7)
MCV: 85 fL (ref 79–97)
Monocytes Absolute: 0.4 x10E3/uL (ref 0.1–0.9)
Monocytes: 5 %
Neutrophils Absolute: 3.6 x10E3/uL (ref 1.4–7.0)
Neutrophils: 48 %
Platelets: 352 x10E3/uL (ref 150–450)
RBC: 4.75 x10E6/uL (ref 3.77–5.28)
RDW: 12.7 % (ref 11.7–15.4)
WBC: 7.7 x10E3/uL (ref 3.4–10.8)

## 2018-10-25 LAB — HEMOGLOBIN A1C
Est. average glucose Bld gHb Est-mCnc: 120 mg/dL
Hgb A1c MFr Bld: 5.8 % — ABNORMAL HIGH (ref 4.8–5.6)

## 2018-10-25 LAB — BASIC METABOLIC PANEL WITH GFR
BUN/Creatinine Ratio: 17 (ref 9–23)
BUN: 15 mg/dL (ref 6–20)
CO2: 21 mmol/L (ref 20–29)
Calcium: 9.6 mg/dL (ref 8.7–10.2)
Chloride: 98 mmol/L (ref 96–106)
Creatinine, Ser: 0.88 mg/dL (ref 0.57–1.00)
GFR calc Af Amer: 96 mL/min/1.73
GFR calc non Af Amer: 83 mL/min/1.73
Glucose: 88 mg/dL (ref 65–99)
Potassium: 4.6 mmol/L (ref 3.5–5.2)
Sodium: 140 mmol/L (ref 134–144)

## 2018-11-05 ENCOUNTER — Telehealth: Payer: Self-pay | Admitting: *Deleted

## 2018-11-05 NOTE — Telephone Encounter (Signed)
-----   Message from Cain Saupe, MD sent at 10/26/2018  3:07 PM EST ----- Please notify patient that her basic metabolic panel is normal.  Patient was made aware at her visit of hemoglobin A1c of 5.8 consistent with prediabetes.  Patient's CBC is now normal with no indication of anemia.

## 2018-11-05 NOTE — Telephone Encounter (Signed)
Patient verified DOB Patient is aware of CMP being normal and A1C being 5.8 and needing to implement no sugar diet and eliminating carb. Patient aware of blood count being normal.

## 2018-11-06 MED FILL — AMLODIPINE BESYLATE 10 MG T: 10 | 30 days supply | Qty: 30 | Fill #0

## 2018-11-20 ENCOUNTER — Ambulatory Visit: Payer: Medicaid Other | Admitting: Critical Care Medicine

## 2018-11-20 ENCOUNTER — Ambulatory Visit: Payer: Medicaid Other | Admitting: Family Medicine

## 2019-01-12 MED FILL — AMLODIPINE BESYLATE 10 MG T: 10 | 30 days supply | Qty: 30 | Fill #1

## 2019-03-13 MED FILL — ?AMLODIPINE BESYLATE 10 MG: 10 | 30 days supply | Qty: 30 | Fill #2

## 2019-04-26 ENCOUNTER — Emergency Department (HOSPITAL_BASED_OUTPATIENT_CLINIC_OR_DEPARTMENT_OTHER): Payer: BLUE CROSS/BLUE SHIELD

## 2019-04-26 ENCOUNTER — Emergency Department (HOSPITAL_COMMUNITY)
Admission: EM | Admit: 2019-04-26 | Discharge: 2019-04-26 | Disposition: A | Discharge status: Home / Self Care | Payer: BLUE CROSS/BLUE SHIELD | Attending: Emergency Medicine | Admitting: Emergency Medicine

## 2019-04-26 ENCOUNTER — Other Ambulatory Visit: Payer: Self-pay

## 2019-04-26 ENCOUNTER — Encounter (HOSPITAL_COMMUNITY): Payer: Self-pay | Admitting: Emergency Medicine

## 2019-04-26 ENCOUNTER — Emergency Department (HOSPITAL_COMMUNITY): Payer: BLUE CROSS/BLUE SHIELD

## 2019-04-26 DIAGNOSIS — R600 Localized edema: Secondary | ICD-10-CM | POA: Diagnosis not present

## 2019-04-26 DIAGNOSIS — Y939 Activity, unspecified: Secondary | ICD-10-CM | POA: Insufficient documentation

## 2019-04-26 DIAGNOSIS — M545 Low back pain: Secondary | ICD-10-CM | POA: Diagnosis not present

## 2019-04-26 DIAGNOSIS — X500XXA Overexertion from strenuous movement or load, initial encounter: Secondary | ICD-10-CM | POA: Diagnosis not present

## 2019-04-26 DIAGNOSIS — Y999 Unspecified external cause status: Secondary | ICD-10-CM | POA: Diagnosis not present

## 2019-04-26 DIAGNOSIS — M79609 Pain in unspecified limb: Secondary | ICD-10-CM | POA: Diagnosis not present

## 2019-04-26 DIAGNOSIS — I10 Essential (primary) hypertension: Secondary | ICD-10-CM | POA: Diagnosis not present

## 2019-04-26 DIAGNOSIS — Z79899 Other long term (current) drug therapy: Secondary | ICD-10-CM | POA: Insufficient documentation

## 2019-04-26 DIAGNOSIS — M25561 Pain in right knee: Secondary | ICD-10-CM | POA: Insufficient documentation

## 2019-04-26 DIAGNOSIS — Y929 Unspecified place or not applicable: Secondary | ICD-10-CM | POA: Diagnosis not present

## 2019-04-26 MED ORDER — IBUPROFEN 400 MG PO TABS
600.0000 mg | ORAL_TABLET | Freq: Once | ORAL | Status: AC
  Administered 2019-04-26: 600 mg via ORAL
  Filled 2019-04-26: qty 1

## 2019-04-26 NOTE — Discharge Instructions (Addendum)
Please take Ibuprofen (Advil, motrin) and Tylenol (acetaminophen) to relieve your pain.  You may take up to 600 MG (3 pills) of normal strength ibuprofen every 8 hours as needed.  In between doses of ibuprofen you make take tylenol, up to 1,000 mg (two extra strength pills).  Do not take more than 3,000 mg tylenol in a 24 hour period.  Please check all medication labels as many medications such as pain and cold medications may contain tylenol.  Do not drink alcohol while taking these medications.  Do not take other NSAID'S while taking ibuprofen (such as aleve or naproxen).  Please take ibuprofen with food to decrease stomach upset.  Today your blood pressure was slightly high.  Please follow-up with your primary care doctor for a recheck in the next 1 to 2 weeks.

## 2019-04-26 NOTE — ED Notes (Signed)
Patient transported to X-ray 

## 2019-04-26 NOTE — ED Triage Notes (Signed)
Pt. Stated, on Aug. 17, 2020  I think I twisted my rt. Knee and now it goes throughout my whole leg.  Pt. Took Tylenol and stated, it helped a little

## 2019-04-26 NOTE — ED Provider Notes (Signed)
[facility] Provider Note   CSN: [id number] Arrival date & time: 04/26/19  [id number]     History   Chief Complaint Chief Complaint  Patient presents with  . Leg Pain  . Knee Pain    HPI [name] is a 38 y.o. female with no significant past medical history who presents today for evaluation of right knee pain.  She reports that on August 17 she twisted her right knee.  That cause her pain for 2 to 3 days however got better.  She reports that on the 27th, 3 days ago, she started to have worsening pain in her right knee.  She denies any fevers.  She states that she took Tylenol this morning which provided mild relief.  She reports that the pain now shoots from her knee down her leg into her foot and up into her lower back.  She denies any other recent injuries.       HPI  Past Medical History:  Diagnosis Date  . Complication of anesthesia    pain with surgery  . GERD (gastroesophageal reflux disease)   . History of kidney infection during pregnancy   . Hypertension   . PP NVB 02/02/12 02/02/2012  . Unspecified hemorrhoids without mention of complication     Patient Active Problem List   Diagnosis Date Noted  . Postpartum care and examination 04/24/2018  . Encounter for surveillance of injectable contraceptive 04/24/2018  . Chronic hypertension 03/27/2018  . S/P cesarean section 03/19/2018    Past Surgical History:  Procedure Laterality Date  . CESAREAN SECTION N/A 09/13/2014   Procedure: CESAREAN SECTION;  Surgeon: Levie HeritageJacob J Stinson, DO;  Location: [facility];  Service: Obstetrics;  Laterality: N/A;  . CESAREAN SECTION MULTI-GESTATIONAL N/A 03/19/2018   Procedure: REPEAT CESAREAN SECTION MULTI-GESTATIONAL;  Surgeon: Federico FlakeNewton, Kimberly Niles, MD;  [facility];  Service: Obstetrics;  Laterality: N/A;     OB History    Gravida  4   Para  4   Term  3   Preterm  1   AB      Living  6     SAB      TAB      Ectopic      Multiple  2   Live Births  6            Home Medications    Prior to Admission medications   Medication Sig Start Date End Date Taking? Authorizing Provider  amLODipine (NORVASC) 10 MG tablet Take 1 tablet (10 mg total) by mouth daily. To lower blood pressure 10/24/18   Fulp, Cammie, MD  docusate sodium (COLACE) 100 MG capsule Take 1 capsule (100 mg total) by mouth 2 (two) times daily. Patient not taking: Reported on 05/14/2018 03/27/18   Marylene LandKooistra, Kathryn Lorraine, CNM  ferrous sulfate 325 (65 FE) MG tablet Take 1 tablet (325 mg total) by mouth 2 (two) times daily. 03/27/18   Marylene LandKooistra, Kathryn Lorraine, CNM  medroxyPROGESTERone (DEPO-PROVERA) 150 MG/ML injection Inject 1 mL (150 mg total) into the muscle every 3 (three) months. 04/24/18   [name], Michael L, MD  Prenatal Multivit-Min-Fe-FA (PRENATAL VITAMINS) 0.8 MG tablet Take 1 tablet by mouth daily. 09/06/17   Adam PhenixArnold, James G, MD    Family History Family History  Problem Relation Age of Onset  . Hypertension Father   . Hypertension Mother   . Anesthesia problems Neg Hx     Social History Social History   Tobacco  Use  . Smoking status: Never Smoker  . Smokeless tobacco: Never Used  Substance Use Topics  . Alcohol use: No  . Drug use: No     Allergies   Patient has no known allergies.   Review of Systems Review of Systems  Constitutional: Negative for chills and fever.  Gastrointestinal: Negative for abdominal pain.  Musculoskeletal: Positive for back pain.       Leg pain   Neurological: Negative for weakness and numbness.  All other systems reviewed and are negative.    Physical Exam Updated Vital Signs BP (!) 138/95 (BP Location: Right Arm)   Pulse 88   Temp 98.7 F (37.1 C) (Oral)   Resp 18   LMP 04/24/2019   SpO2 100%   Physical Exam Vitals signs and nursing note reviewed.  Constitutional:      General: She is not in acute distress.    Appearance: She is not ill-appearing.  HENT:      Head: Normocephalic.  Cardiovascular:     Rate and Rhythm: Normal rate.  Pulmonary:     Effort: Pulmonary effort is normal. No respiratory distress.  Musculoskeletal:     Right lower leg: Edema present.     Comments: Exam limited by body habitus.  There is mild swelling in the right lower leg when compared with left.  There is diffuse tenderness palpation over the right posterior knee and proximal calf.  There is tenderness to palpation in the right sided lumbar paraspinal muscles and right sided buttock, palpation here both re-creates and exacerbates her reported pain and radiation.  Flexion of the right knee is limited secondary to pain however she is able to flex to 90 degrees.  Skin:    General: Skin is warm and dry.     Comments: No obvious wounds, abnormal redness, or marks over the right knee.  Neurological:     Mental Status: She is alert.     Sensory: No sensory deficit.  Psychiatric:        Mood and Affect: Mood normal.        Behavior: Behavior normal.      ED Treatments / Results  Labs (all labs ordered are listed, but only abnormal results are displayed) Labs Reviewed - No data to display  EKG None  Radiology Dg Knee Complete 4 Views Right  Result Date: 04/26/2019 CLINICAL DATA:  Right knee pain.  Recent injury. EXAM: RIGHT KNEE - COMPLETE 4+ VIEW COMPARISON:  None. FINDINGS: No fracture, joint effusion or dislocation. No suspicious focal osseous lesion. Mild tricompartmental right knee osteoarthritis. No radiopaque foreign body. IMPRESSION: No right knee fracture, joint effusion or malalignment. Mild tricompartmental right knee osteoarthritis. Electronically Signed   By: Delbert Phenix M.D.   On: 04/26/2019 12:41   Vas [place] Lower Extremity Venous (dvt) (only Mc & Wl 7a-7p)  Result Date: 04/26/2019  Lower Venous Study Indications: Knee pain, patient feels like she twisted her knee.  Limitations: Body habitus. Comparison Study: No prior study on file for comparison  Performing Technologist: Sherren Kerns RVS  Examination Guidelines: A complete evaluation includes B-mode imaging, spectral Doppler, color Doppler, and power Doppler as needed of all accessible portions of each vessel. Bilateral testing is considered an integral part of a complete examination. Limited examinations for reoccurring indications may be performed as noted.  +---------+---------------+---------+-----------+----------+--------------+ RIGHT    CompressibilityPhasicitySpontaneityPropertiesThrombus Aging +---------+---------------+---------+-----------+----------+--------------+ CFV      Full           Yes  Yes                                 +---------+---------------+---------+-----------+----------+--------------+ SFJ      Full                                                        +---------+---------------+---------+-----------+----------+--------------+ FV Prox  Full                                                        +---------+---------------+---------+-----------+----------+--------------+ FV Mid   Full                                                        +---------+---------------+---------+-----------+----------+--------------+ FV DistalFull                                                        +---------+---------------+---------+-----------+----------+--------------+ PFV      Full                                                        +---------+---------------+---------+-----------+----------+--------------+ POP      Full           Yes      Yes                                 +---------+---------------+---------+-----------+----------+--------------+ PTV      Full                                                        +---------+---------------+---------+-----------+----------+--------------+ PERO     Full                                                         +---------+---------------+---------+-----------+----------+--------------+ GSV      Full                                                        +---------+---------------+---------+-----------+----------+--------------+ SSV      Full                                                        +---------+---------------+---------+-----------+----------+--------------+   +----+---------------+---------+-----------+----------+--------------+  LEFTCompressibilityPhasicitySpontaneityPropertiesThrombus Aging +----+---------------+---------+-----------+----------+--------------+ CFV Full           Yes      Yes                                 +----+---------------+---------+-----------+----------+--------------+     Summary: Right: There is no evidence of deep vein thrombosis in the lower extremity. No cystic structure found in the popliteal fossa. Left: No evidence of common femoral vein obstruction.  *See table(s) above for measurements and observations.    Preliminary     Procedures Procedures (including critical care time)  Medications Ordered in ED Medications  ibuprofen (ADVIL) tablet 600 mg (600 mg Oral Given 04/26/19 1302)     Initial Impression / Assessment and Plan / ED Course  I have reviewed the triage vital signs and the nursing notes.  Pertinent labs & imaging results that were available during my care of the patient were reviewed by me and considered in my medical decision making (see chart for details).       Patient presents today for evaluation of right knee pain.  She twisted her knee over a week ago however has had worsening pain over the past 3 days.  Based on the delayed pain presentation concern for secondary process such as DVT.  X-rays were obtained without evidence of joint effusion or fracture, or other abnormality.  X-rays do show mild tricompartment arthritis.  Ultrasound DVT study was obtained without evidence of DVT or cystic structure in the popliteal  fossa.  She is generally well-appearing, she is able to bend her knee however reports that it is painful.  There is no abnormal redness, induration or fluctuance over the right knee.  Do not suspect gout or septic arthritis.  Ace wrap, and crutches.  Recommended OTC ibuprofen and Tylenol along with rice as needed for pain.  She is given Ortho follow-up.  Return precautions were discussed with patient who states their understanding.  At the time of discharge patient denied any unaddressed complaints or concerns.  Patient is agreeable for discharge home.    Final Clinical Impressions(s) / ED Diagnoses   Final diagnoses:  Acute pain of right knee    ED Discharge Orders    None       Norman ClayHammond, Neveyah Garzon W, PA-C 04/26/19 1604    Melene PlanFloyd, Dan, DO 04/27/19 1106

## 2019-04-26 NOTE — Progress Notes (Signed)
VASCULAR LAB PRELIMINARY  PRELIMINARY  PRELIMINARY  PRELIMINARY  Right lower extremity venous duplex completed.    Preliminary report:  See CV proc for preliminary results.  Gave report to Wyn Quaker, PA-C  Ferrin Liebig, [employer], RVT 04/26/2019, 12:48 PM

## 2019-05-22 MED FILL — ?AMLODIPINE BESYLATE 10 MG: 10 | 30 days supply | Qty: 30 | Fill #3

## 2019-07-10 MED FILL — ?AMLODIPINE BESYLATE 10 MG: 10 | 30 days supply | Qty: 30 | Fill #4

## 2019-08-03 ENCOUNTER — Encounter: Payer: Self-pay | Admitting: Obstetrics and Gynecology

## 2019-08-03 ENCOUNTER — Other Ambulatory Visit: Payer: Self-pay

## 2019-08-03 ENCOUNTER — Ambulatory Visit (INDEPENDENT_AMBULATORY_CARE_PROVIDER_SITE_OTHER): Payer: BLUE CROSS/BLUE SHIELD | Admitting: Family Medicine

## 2019-08-03 VITALS — BP 136/90 | HR 85 | Ht 69.0 in | Wt 278.7 lb

## 2019-08-03 DIAGNOSIS — N926 Irregular menstruation, unspecified: Secondary | ICD-10-CM | POA: Diagnosis not present

## 2019-08-03 LAB — POCT PREGNANCY, URINE: Preg Test, Ur: NEGATIVE

## 2019-08-03 NOTE — Progress Notes (Signed)
GYNECOLOGY OFFICE VISIT NOTE  History:   Colleen Terrell is a 38 y.o. [phone] here today for irregular bleeding. Reports that she normally has a very normal cycle. Until October 2020 she had a normal menstrual cycle with every 28 days with bleeding for 5 days. In November her cycle started 3 days late and lasted 8 days. Previously taking Depo, last dose about 1 year ago. Reports she started bleeding again on 12/3; about 2 weeks after her last cycle. With this last bleeding it has been lighter and more spotting than bleeding. Currently still spotting. Last intercourse around 11/20. She denies any abnormal vaginal discharge, pelvic pain or other concerns. Denies recent stress or travel.    Past Medical History:  Diagnosis Date  . Complication of anesthesia    pain with surgery  . GERD (gastroesophageal reflux disease)   . History of kidney infection during pregnancy   . Hypertension   . PP NVB 02/02/12 02/02/2012  . Unspecified hemorrhoids without mention of complication     Past Surgical History:  Procedure Laterality Date  . CESAREAN SECTION N/A 09/13/2014   Procedure: CESAREAN SECTION;  Surgeon: Truett Mainland, DO;  Location: [facility];  Service: Obstetrics;  Laterality: N/A;  . CESAREAN SECTION MULTI-GESTATIONAL N/A 03/19/2018   Procedure: REPEAT CESAREAN SECTION MULTI-GESTATIONAL;  Surgeon: Caren Macadam, MD;  Location: [place];  Service: Obstetrics;  Laterality: N/A;    The following portions of the patient's history were reviewed and updated as appropriate: allergies, current medications, past family history, past medical history, past social history, past surgical history and problem list.   Health Maintenance:  Normal pap and negative HRHPV on 05/14/2018.  Review of Systems:  Pertinent items noted in HPI and remainder of comprehensive ROS otherwise negative.  Physical Exam:  BP 136/90 (BP Location: Left Arm)   Pulse 85   Ht 5\' 9"  (1.753 m)   Wt 278 lb  11.2 oz (126.4 kg)   LMP 07/18/2019 (Approximate)   BMI 41.16 kg/m  CONSTITUTIONAL: Well-developed, well-nourished female in no acute distress.  HEENT:  Normocephalic, atraumatic. External right and left ear normal. No scleral icterus.  NECK: Normal range of motion, supple, no masses noted on observation SKIN: No rash noted. Not diaphoretic. No erythema. No pallor. MUSCULOSKELETAL: Normal range of motion. No edema noted. NEUROLOGIC: Alert and oriented to person, place, and time. Normal muscle tone coordination. No cranial nerve deficit noted. PSYCHIATRIC: Normal mood and affect. Normal behavior. Normal judgment and thought content. CARDIOVASCULAR: Normal heart rate noted RESPIRATORY: Effort normal, no problems with respiration noted ABDOMEN: No masses noted. No other overt distention noted.   PELVIC: Deferred  Labs and Imaging Results for orders placed or performed in visit on 08/03/19 (from the past 168 hour(s))  Pregnancy, urine POC   Collection Time: 08/03/19  2:41 PM  Result Value Ref Range   Preg Test, Ur NEGATIVE NEGATIVE   No results found.    Assessment and Plan:  Colleen Terrell was seen today for gynecologic exam.  Diagnoses and all orders for this visit:  Irregular bleeding -     Pregnancy, urine POC: Negative - Only one irregular cycle thus far; could be hormonal/ovulatory related - If irregular bleeding persists for > 2-3 months; consider pelvic US/EMB  Routine preventative health maintenance measures emphasized. Please refer to After Visit Summary for other counseling recommendations.   Return if symptoms worsen or fail to improve.    Total face-to-face time with patient: 15 minutes.  Over  50% of encounter was spent on counseling and coordination of care.  Jerilynn Birkenhead, MD [facility] Family Medicine Fellow, [facility] for [employer], [facility]

## 2019-08-24 MED FILL — ?AMLODIPINE BESYLATE 10 MG: 10 | 30 days supply | Qty: 30 | Fill #5

## 2019-09-28 ENCOUNTER — Other Ambulatory Visit: Payer: Self-pay | Admitting: Pharmacist

## 2019-09-28 DIAGNOSIS — I10 Essential (primary) hypertension: Secondary | ICD-10-CM

## 2019-09-28 MED ORDER — AMLODIPINE BESYLATE 10 MG PO TABS: 10.0000 mg | ORAL_TABLET | Freq: Every day | ORAL | 0 refills | Status: DC

## 2019-09-28 MED FILL — AMLODIPINE BESYLATE 10 MG T: 10 | 30 days supply | Qty: 30 | Fill #0

## 2019-09-30 ENCOUNTER — Other Ambulatory Visit: Payer: Self-pay

## 2019-09-30 ENCOUNTER — Ambulatory Visit: Payer: BLUE CROSS/BLUE SHIELD | Attending: Family Medicine | Admitting: Family Medicine

## 2019-09-30 ENCOUNTER — Encounter: Payer: Self-pay | Admitting: Family Medicine

## 2019-09-30 VITALS — BP 135/87 | HR 77 | Resp 16 | Wt 278.2 lb

## 2019-09-30 DIAGNOSIS — I10 Essential (primary) hypertension: Secondary | ICD-10-CM | POA: Diagnosis not present

## 2019-09-30 DIAGNOSIS — M25561 Pain in right knee: Secondary | ICD-10-CM | POA: Diagnosis not present

## 2019-09-30 DIAGNOSIS — G8929 Other chronic pain: Secondary | ICD-10-CM | POA: Diagnosis not present

## 2019-09-30 DIAGNOSIS — R7303 Prediabetes: Secondary | ICD-10-CM | POA: Diagnosis not present

## 2019-09-30 MED ORDER — IBUPROFEN 600 MG PO TABS: 600.0000 mg | ORAL_TABLET | Freq: Three times a day (TID) | ORAL | 1 refills | Status: DC | PRN

## 2019-09-30 MED ORDER — AMLODIPINE BESYLATE 10 MG PO TABS: 10.0000 mg | ORAL_TABLET | Freq: Every day | ORAL | 1 refills | Status: DC

## 2019-09-30 MED FILL — IBUPROFEN 600 MG TABLET: 600 | 20 days supply | Qty: 60 | Fill #0

## 2019-09-30 NOTE — Progress Notes (Signed)
Established Patient Office Visit  Subjective:  Patient ID: Colleen Terrell, female    DOB: February 09, 1981  Age: 39 y.o. MRN: [id number]  CC:  Chief Complaint  Patient presents with  . Hypertension  . Knee Pain    HPI Colleen Terrell, 39 year old female last seen in the office on 10/24/2018 in follow-up of hypertension, who returns for follow-up of hypertension, prediabetes and medication refill along with complaint of right knee pain since August.  Patient reports that in August of this year she was walking on a flat surface and felt as if she tripped over her shoe turned over causing her to stumble but she did not fall.  She states that about a week later she had onset of pain and swelling in her right knee.  She was seen at the emergency department on 04/26/2019.  At that time, she was having tenderness on the outer aspect of the right knee which extended behind her kneecap and slightly below the knee.  Per ED records, patient had x-ray of the right knee showing tricompartmental arthritis as well as ultrasound to rule out DVT as she has some pain and swelling in the right leg and ultrasound of the lower extremity did not show any cystic structures on the right posterior knee.  She was provided with referral to orthopedics and recommendation to use over-the-counter ibuprofen and Tylenol along with rest, ice, compression and elevation.  When her knee pain failed to improve, she went to the [facility] urgent care on [address] on 06/24/2019 and was prescribed Mobic which she states did not help and she was also referred to physical therapy which also did not seem to help with her knee pain.          She continues to have sensation of pain that is generalized in the right knee and she cannot describe the quality of the pain or quantify the pain on a 0-10 pain scale.  She sometimes has no pain when sitting still but when she stands up from a seated position or if she is walking she  will have onset of pain in the entire right knee.  She also has knee pain when attempting to do housework.  She is feeling as if the knee pain is limiting her ability to do things that she normally does.  She also has 23-month-old twins and feels that her knee pain is limiting her abilities to care for her children.  She is no longer having pain along the posterior outer portion of the knee.  When she is standing up for a while such as cooking, she feels as if she gets swelling on the upper, outside area of the leg just above the knee.  She has not felt any increased warmth of the knee.  Knee is more painful when she has to bend her knees and get down on her knees for prayer and finds it is difficult to get back up.  Sometimes she feels as if she has to limp when walking due to the knee pain.  She has been taking over-the-counter ibuprofen and Tylenol which did slightly decrease the pain.  She would like a prescription strength ibuprofen.          She reports that she has been taking her blood pressure medication daily and denies headaches or dizziness related to her blood pressure.  She does not believe that she has had any increased lower extremity edema with the use of amlodipine.  She denies any chest pain or palpitations, no shortness of breath or cough.           She was not really aware of the diagnosis of prediabetes with prior hemoglobin A1c of 5.8 on 10/24/2018.  She denies any increased thirst, no urinary frequency and no blurred vision.  Past Medical History:  Diagnosis Date  . Complication of anesthesia    pain with surgery  . GERD (gastroesophageal reflux disease)   . History of kidney infection during pregnancy   . Hypertension   . PP NVB 02/02/12 02/02/2012  . Unspecified hemorrhoids without mention of complication     Past Surgical History:  Procedure Laterality Date  . CESAREAN SECTION N/A 09/13/2014   Procedure: CESAREAN SECTION;  Surgeon: Levie Heritage, DO;  Location: [facility];   Service: Obstetrics;  Laterality: N/A;  . CESAREAN SECTION MULTI-GESTATIONAL N/A 03/19/2018   Procedure: REPEAT CESAREAN SECTION MULTI-GESTATIONAL;  Surgeon: Federico Flake, MD;  Location: [facility] BIRTHING SUITES;  Service: Obstetrics;  Laterality: N/A;    Family History  Problem Relation Age of Onset  . Hypertension Father   . Hypertension Mother   . Anesthesia problems Neg Hx     Social History   Socioeconomic History  . Marital status: Married    Spouse name: Not on file  . Number of children: Not on file  . Years of education: Not on file  . Highest education level: Not on file  Occupational History  . Not on file  Tobacco Use  . Smoking status: Never Smoker  . Smokeless tobacco: Never Used  Substance and Sexual Activity  . Alcohol use: No  . Drug use: No  . Sexual activity: Not Currently    Birth control/protection: None  Other Topics Concern  . Not on file  Social History Narrative  . Not on file   Social Determinants of Health   Financial Resource Strain:   . Difficulty of Paying Living Expenses: Not on file  Food Insecurity:   . Worried About Programme researcher, broadcasting/film/video in the Last Year: Not on file  . Ran Out of Food in the Last Year: Not on file  Transportation Needs:   . Lack of Transportation (Medical): Not on file  . Lack of Transportation (Non-Medical): Not on file  Physical Activity:   . Days of Exercise per Week: Not on file  . Minutes of Exercise per Session: Not on file  Stress:   . Feeling of Stress : Not on file  Social Connections:   . Frequency of Communication with Friends and Family: Not on file  . Frequency of Social Gatherings with Friends and Family: Not on file  . Attends Religious Services: Not on file  . Active Member of Clubs or Organizations: Not on file  . Attends Banker Meetings: Not on file  . Marital Status: Not on file  Intimate Partner Violence:   . Fear of Current or Ex-Partner: Not on file  . Emotionally  Abused: Not on file  . Physically Abused: Not on file  . Sexually Abused: Not on file    Outpatient Medications Prior to Visit  Medication Sig Dispense Refill  . amLODipine (NORVASC) 10 MG tablet Take 1 tablet (10 mg total) by mouth daily. To lower blood pressure 30 tablet 0  . docusate sodium (COLACE) 100 MG capsule Take 1 capsule (100 mg total) by mouth 2 (two) times daily. (Patient not taking: Reported on 05/14/2018) 60 capsule 0  . ferrous  sulfate 325 (65 FE) MG tablet Take 1 tablet (325 mg total) by mouth 2 (two) times daily. (Patient not taking: Reported on 08/03/2019) 60 tablet 3  . medroxyPROGESTERone (DEPO-PROVERA) 150 MG/ML injection Inject 1 mL (150 mg total) into the muscle every 3 (three) months. (Patient not taking: Reported on 08/03/2019) 1 mL 0  . Prenatal Multivit-Min-Fe-FA (PRENATAL VITAMINS) 0.8 MG tablet Take 1 tablet by mouth daily. (Patient not taking: Reported on 08/03/2019) 30 tablet 12   No facility-administered medications prior to visit.    No Known Allergies  ROS Review of Systems  Constitutional: Positive for fatigue (Mild). Negative for chills and fever.  HENT: Negative for sore throat and trouble swallowing.   Eyes: Negative for photophobia and visual disturbance.  Respiratory: Negative for cough and shortness of breath.   Cardiovascular: Negative for chest pain and palpitations.  Gastrointestinal: Negative for abdominal pain, blood in stool, constipation, diarrhea and nausea.  Endocrine: Negative for polydipsia, polyphagia and polyuria.  Genitourinary: Negative for dysuria and frequency.  Musculoskeletal: Positive for arthralgias and gait problem. Negative for back pain.  Neurological: Negative for dizziness and headaches.  Hematological: Negative for adenopathy. Does not bruise/bleed easily.      Objective:    Physical Exam  Constitutional: She is oriented to person, place, and time. She appears well-developed and well-nourished.  Obese female in no  acute distress sitting on exam table wearing face mask as per office COVID-19 protocol  Cardiovascular: Normal rate and regular rhythm.  Pulmonary/Chest: Effort normal and breath sounds normal.  Abdominal: Soft. There is no abdominal tenderness. There is no rebound and no guarding.  Musculoskeletal:        General: Tenderness present.     Cervical back: Normal range of motion and neck supple.     Comments: No appreciable effusion of the right knee joint.  Patient does have medial joint line tenderness to palpation.  She has some mild discomfort over the right lateral insertion of the quadriceps but no edema.  No popliteal tenderness.  Some mild discomfort with palpation along the lateral border of the inferior knee  Lymphadenopathy:    She has no cervical adenopathy.  Neurological: She is alert and oriented to person, place, and time.  Skin: Skin is warm and dry.  Psychiatric: She has a normal mood and affect. Her behavior is normal.  Nursing note and vitals reviewed.   BP 135/87   Pulse 77   Resp 16   Wt 278 lb 3.2 oz (126.2 kg)   SpO2 100%   BMI 41.08 kg/m  Wt Readings from Last 3 Encounters:  09/30/19 278 lb 3.2 oz (126.2 kg)  08/03/19 278 lb 11.2 oz (126.4 kg)  07/10/18 254 lb (115.2 kg)     Health Maintenance Due  Topic Date Due  . INFLUENZA VACCINE  03/28/2019   Patient declines influenza immunization   No results found for: TSH Lab Results  Component Value Date   WBC 7.7 10/24/2018   HGB 12.9 10/24/2018   HCT 40.3 10/24/2018   MCV 85 10/24/2018   PLT 352 10/24/2018   Lab Results  Component Value Date   NA 140 10/24/2018   K 4.6 10/24/2018   CO2 21 10/24/2018   GLUCOSE 88 10/24/2018   BUN 15 10/24/2018   CREATININE 0.88 10/24/2018   BILITOT 0.5 03/29/2018   ALKPHOS 83 03/29/2018   AST 27 03/29/2018   ALT 37 03/29/2018   PROT 6.3 (L) 03/29/2018   ALBUMIN 3.0 (L) 03/29/2018  CALCIUM 9.6 10/24/2018   ANIONGAP 10 03/29/2018   Lab Results  Component  Value Date   CHOL 137 07/31/2018   Lab Results  Component Value Date   HDL 36 (L) 07/31/2018   Lab Results  Component Value Date   LDLCALC 86 07/31/2018   Lab Results  Component Value Date   TRIG 76 07/31/2018   Lab Results  Component Value Date   CHOLHDL 3.8 07/31/2018   Lab Results  Component Value Date   HGBA1C 5.8 (H) 10/24/2018      Assessment & Plan:  1. Essential hypertension Blood pressure continues to be controlled on amlodipine.  Refill provided at today's visit.  Low-sodium diet and weight loss encouraged. - amLODipine (NORVASC) 10 MG tablet; Take 1 tablet (10 mg total) by mouth daily. To lower blood pressure  Dispense: 90 tablet; Refill: 1  2. Prediabetes Patient with hemoglobin A1c in February 2020 of 5.8 consistent with prediabetes.  She will have basic metabolic panel and hemoglobin Z8H at today's visit.  She will be notified of the results and if medication such as Metformin is recommended based on the results.  Discussed low carbohydrate and no concentrated sweets diet. - Basic Metabolic Panel - Hemoglobin A1c  3. Chronic pain of right knee Patient with complaint of chronic right knee pain since she stumbled in August of last year but did not fall.  She had onset a week later of her right knee pain and swelling.  She has tried physical therapy without relief.  She has had x-rays showing tricompartmental osteoarthritis of the knee.  She likely needs evaluation for possible meniscus injury due to initial knee pain and swelling in the lateral and posterior knee which extended slightly down the outside of the right leg.  Prescription provided for ibuprofen 600 mg to take as needed for pain and she is aware that she should take the medication after eating to help avoid stomach upset. - AMB referral to orthopedics - ibuprofen (ADVIL) 600 MG tablet; Take 1 tablet (600 mg total) by mouth every 8 (eight) hours as needed for moderate pain. Take after eating  Dispense: 60  tablet; Refill: 1   An After Visit Summary was printed and given to the patient.  Follow-up: Return in about 5 months (around 02/27/2020) for HTN/prediabetes; sooner if needed.    Cain Saupe, MD

## 2019-09-30 NOTE — Patient Instructions (Signed)

## 2019-10-01 LAB — BASIC METABOLIC PANEL WITH GFR
BUN/Creatinine Ratio: 16 (ref 9–23)
BUN: 11 mg/dL (ref 6–24)
CO2: 22 mmol/L (ref 20–29)
Calcium: 9 mg/dL (ref 8.7–10.2)
Chloride: 101 mmol/L (ref 96–106)
Creatinine, Ser: 0.7 mg/dL (ref 0.57–1.00)
GFR calc Af Amer: 125 mL/min/1.73
GFR calc non Af Amer: 109 mL/min/1.73
Glucose: 115 mg/dL — ABNORMAL HIGH (ref 65–99)
Potassium: 4.1 mmol/L (ref 3.5–5.2)
Sodium: 140 mmol/L (ref 134–144)

## 2019-10-01 LAB — HEMOGLOBIN A1C
Est. average glucose Bld gHb Est-mCnc: 123 mg/dL
Hgb A1c MFr Bld: 5.9 % — ABNORMAL HIGH (ref 4.8–5.6)

## 2019-10-30 MED FILL — AMLODIPINE BESYLATE 10 MG T: 10 | 30 days supply | Qty: 30 | Fill #0

## 2019-12-07 MED FILL — AMLODIPINE BESYLATE 10 MG T: 10 | 30 days supply | Qty: 30 | Fill #1

## 2020-04-18 IMAGING — US US MFM FETAL BPP W/O NON-STRESS
1 series · 12 of 28 positions shown · non-contrast
Comparison: none

[Series 1: us mfm fetal bpp w/o non-stress · 28 acquisitions, 12 frames shown]
[im 2/28]
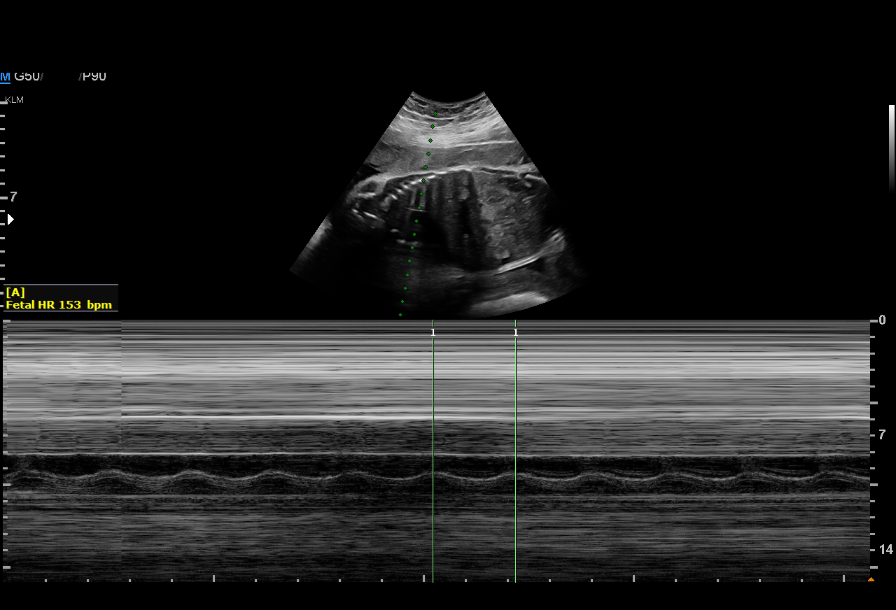
[im 4/28]
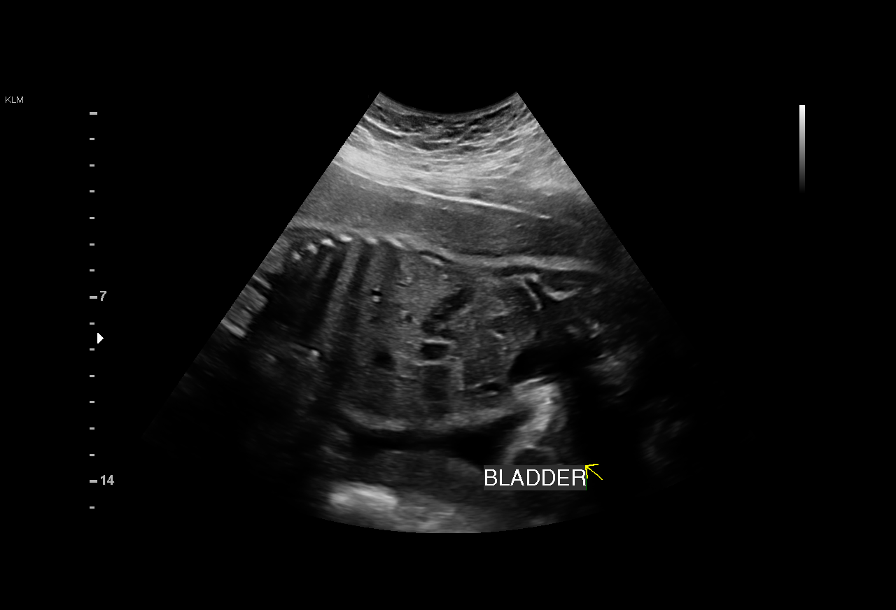
[im 6/28]
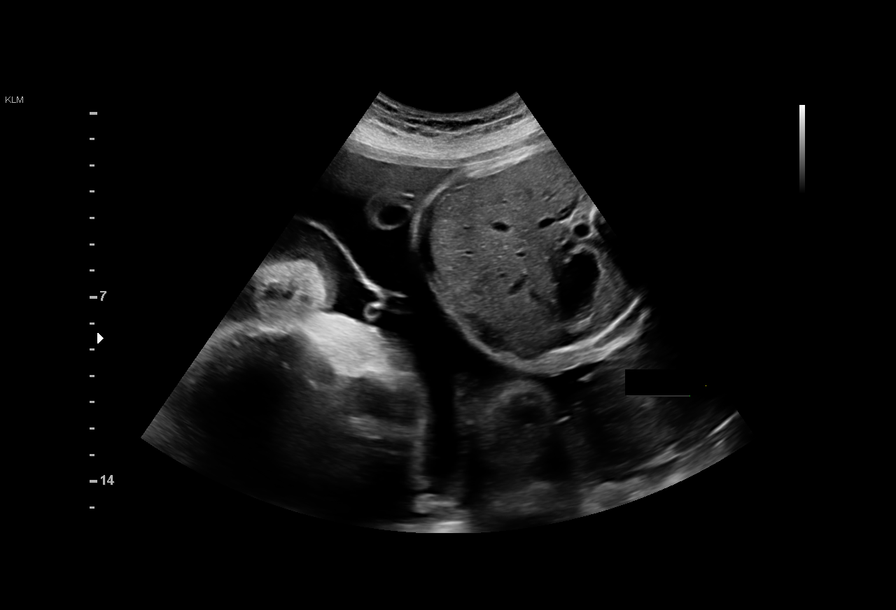
[im 9/28]
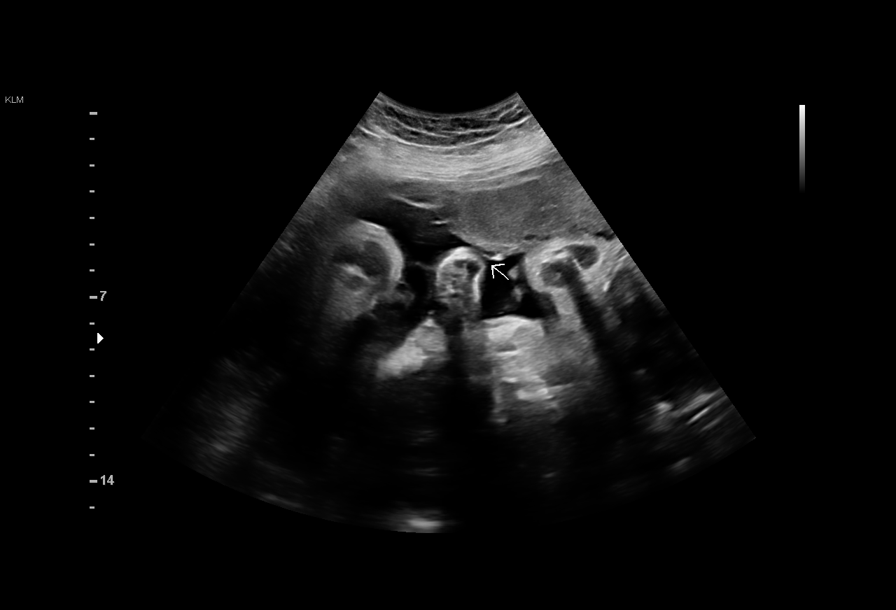
[im 11/28]
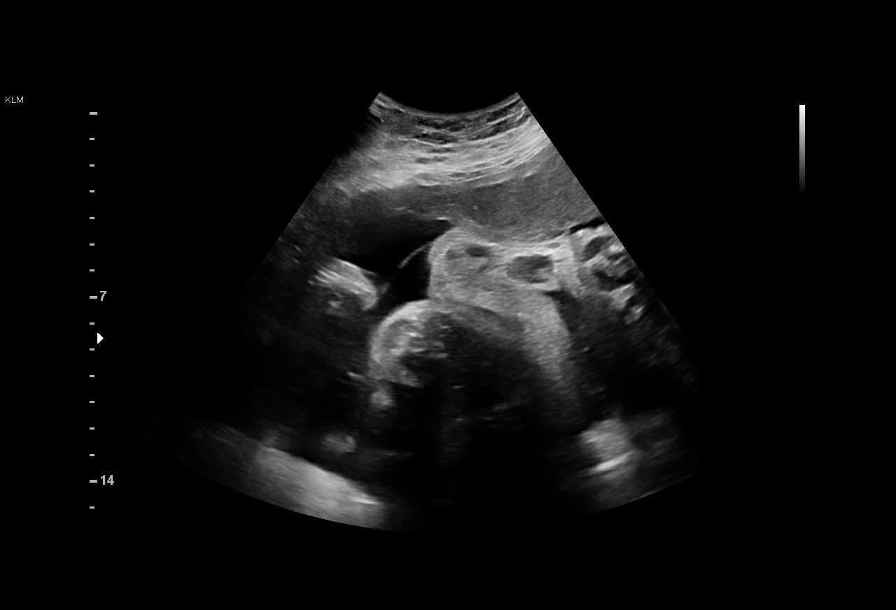
[im 13/28]
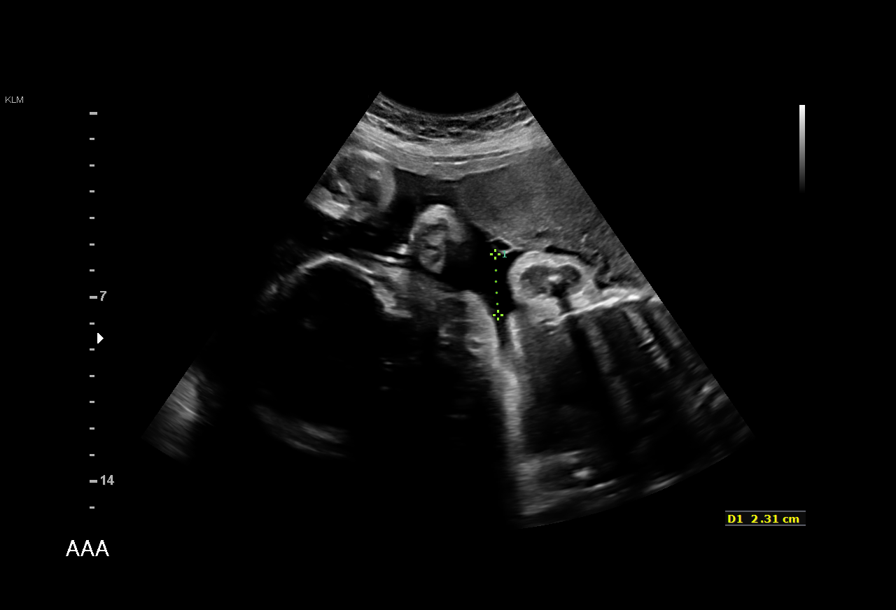
[im 16/28]
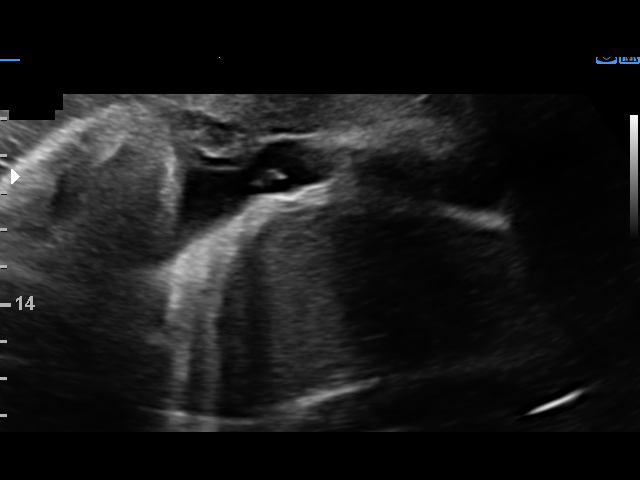
[im 18/28]
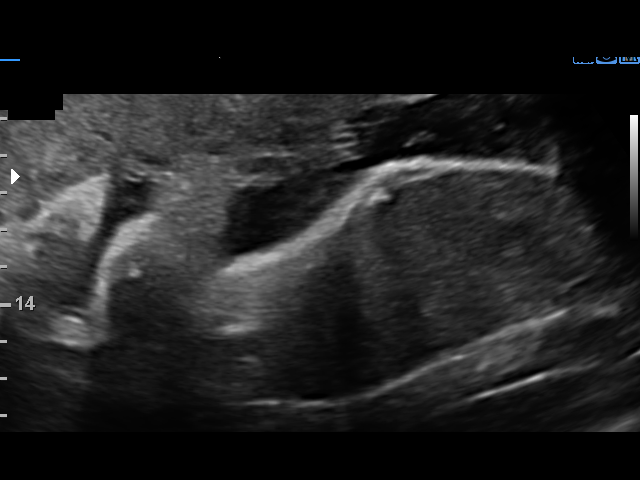
[im 20/28]
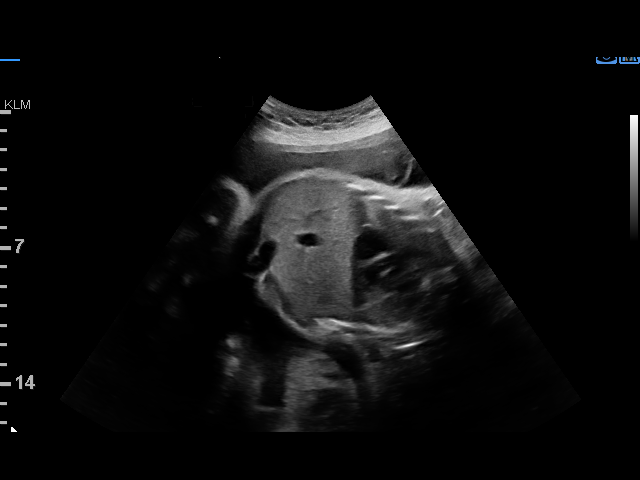
[im 23/28]
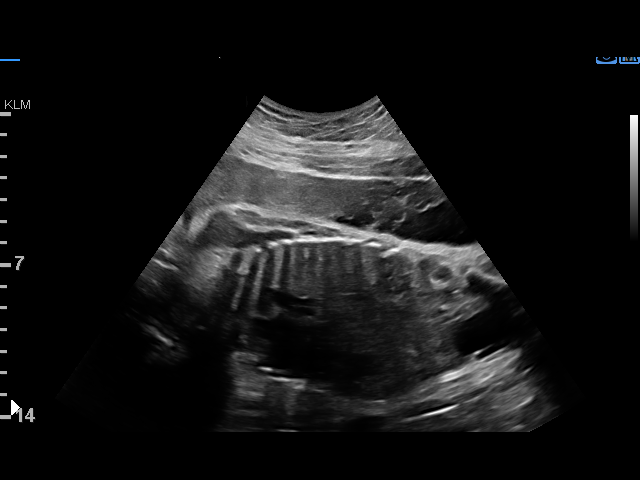
[im 25/28]
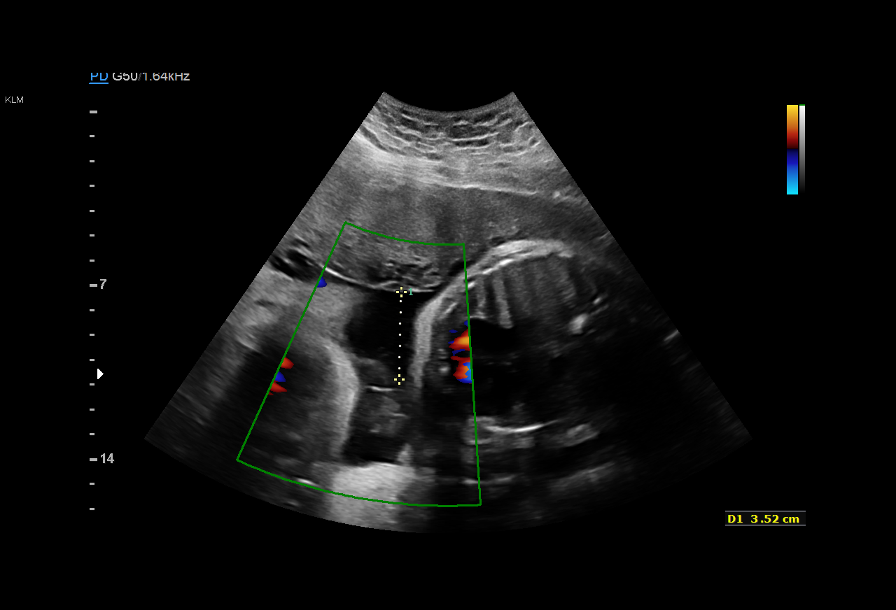
[im 27/28]
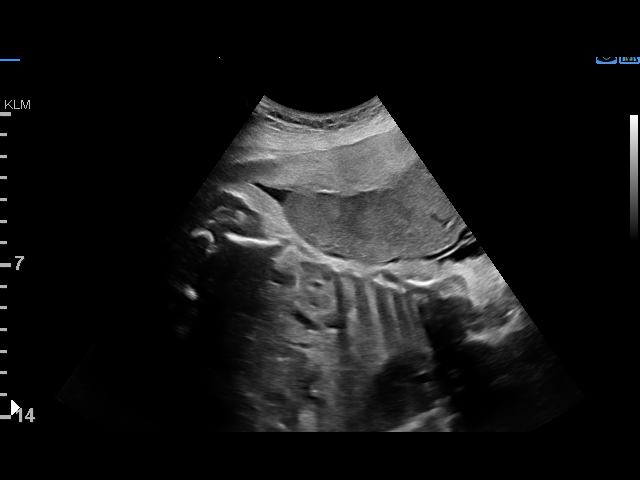

[12 of 28 positions shown; findings below may reference images not displayed]

[facility]

[facility]

GESTATION

1  TEJAUN SOY              [id number]      [id number]     [id number]
2  TEJAUN SOY              [id number]      [id number]     [id number]
Indications

32 weeks gestation of pregnancy
Advanced maternal age multigravida 35+,
third trimester
Twin pregnancy, di/di, third trimester
Previous cesarean delivery, antepartum
Hypertension - Chronic/Pre-existing
OB History

Blood Type:            Height:  5'10"  Weight (lb):  272       BMI:
Gravidity:    4         Term:   3        Prem:   0         SAB:   0
TOP:          0       Ectopic:  0        Living: 4
Fetal Evaluation (Fetus A)

Num Of Fetuses:     2
Fetal Heart         153
Rate(bpm):
Cardiac Activity:   Observed
Fetal Lie:          Lower Fetus
Presentation:       Breech
Amniotic Fluid
AFI FV:      Subjectively within normal limits

Largest Pocket(cm)
3.5
Biophysical Evaluation (Fetus A)

Amniotic F.V:   Within normal limits       F. Tone:         Not Observed
F. Movement:    Observed                   N.S.T:           Reactive
F. Breathing:   Observed                   Score:           [DATE]
Gestational Age (Fetus A)

LMP:           32w 0d        Date:  07/04/17                 EDD:    04/10/18
Best:          32w 0d     Det. By:  LMP  (07/04/17)          EDD:    04/10/18

Fetal Evaluation (Fetus B)

Num Of Fetuses:     2
Fetal Heart         148
Rate(bpm):
Cardiac Activity:   Observed
Presentation:       Transverse, head to maternal left

Amniotic Fluid
AFI FV:      Subjectively within normal limits

Largest Pocket(cm)
4
Biophysical Evaluation (Fetus B)

Amniotic F.V:   Within normal limits       F. Tone:         Observed
F. Movement:    Observed                   N.S.T:           Reactive
F. Breathing:   Observed                   Score:           [DATE]
Gestational Age (Fetus B)

LMP:           32w 0d        Date:  07/04/17                 EDD:    04/10/18
Best:          32w 0d     Det. By:  LMP  (07/04/17)          EDD:    04/10/18
Impression

Dichorionic/diamniotic twin pregnancy at 32+0 weeks
Normal amniotic fluid volume x 2
Twin A: BPP [DATE] (-2 for tone)
Twin B: BPP [DATE]
Recommendations

Continue antenatal testing
Growth US in 3 weeks

## 2020-04-28 ENCOUNTER — Other Ambulatory Visit: Payer: Self-pay | Admitting: Family Medicine

## 2020-04-28 DIAGNOSIS — I10 Essential (primary) hypertension: Secondary | ICD-10-CM

## 2020-04-28 NOTE — Telephone Encounter (Signed)
Medication Refill - Medication: amLODipine (NORVASC) 10 MG tablet     Preferred Pharmacy (with phone number or street name):  [facility] - [place], [place] - [address] Phone:  [phone]  Fax:  [phone]       Agent: Please be advised that RX refills may take up to 3 business days. We ask that you follow-up with your pharmacy.  

## 2020-04-29 MED FILL — AMLODIPINE BESYLATE 10 MG T: 10 | 30 days supply | Qty: 30 | Fill #3

## 2020-06-02 ENCOUNTER — Other Ambulatory Visit: Payer: Self-pay | Admitting: Family Medicine

## 2020-06-02 DIAGNOSIS — I10 Essential (primary) hypertension: Secondary | ICD-10-CM

## 2020-06-02 NOTE — Telephone Encounter (Signed)
Copied from CRM [id number]. Topic: Quick Communication - Rx Refill/Question >> Jun 02, 2020  2:41 PM Mcneil, Ja-Kwan wrote: Medication: amLODipine (NORVASC) 10 MG tablet   Has the patient contacted their pharmacy? yes   Preferred Pharmacy (with phone number or street name): [facility] - [place], [place] - [address]  Phone: [phone]   Fax: [phone]  Agent: Please be advised that RX refills may take up to 3 business days. We ask that you follow-up with your pharmacy.

## 2020-06-09 ENCOUNTER — Other Ambulatory Visit: Payer: Self-pay | Admitting: Family Medicine

## 2020-06-09 ENCOUNTER — Telehealth: Payer: Medicaid Other | Admitting: Internal Medicine

## 2020-06-09 DIAGNOSIS — I10 Essential (primary) hypertension: Secondary | ICD-10-CM

## 2020-06-09 MED FILL — IBUPROFEN 600 MG TABLET: 600 | 20 days supply | Qty: 60 | Fill #1

## 2020-06-09 NOTE — Telephone Encounter (Signed)
Courtesy refill. Appt made for 06/23/20 with Dr. Jillyn Hidden

## 2020-06-10 MED FILL — AMLODIPINE BESYLATE 10 MG T: 10 | 90 days supply | Qty: 90 | Fill #0

## 2020-06-23 ENCOUNTER — Other Ambulatory Visit: Payer: Self-pay

## 2020-06-23 ENCOUNTER — Other Ambulatory Visit: Payer: Self-pay | Admitting: Family Medicine

## 2020-06-23 ENCOUNTER — Ambulatory Visit: Payer: Self-pay | Attending: Family Medicine | Admitting: Family Medicine

## 2020-06-23 ENCOUNTER — Encounter: Payer: Self-pay | Admitting: Family Medicine

## 2020-06-23 VITALS — BP 124/85 | HR 87 | Temp 98.4°F | Wt 279.8 lb

## 2020-06-23 DIAGNOSIS — R7303 Prediabetes: Secondary | ICD-10-CM

## 2020-06-23 DIAGNOSIS — G8929 Other chronic pain: Secondary | ICD-10-CM

## 2020-06-23 DIAGNOSIS — H109 Unspecified conjunctivitis: Secondary | ICD-10-CM

## 2020-06-23 DIAGNOSIS — I10 Essential (primary) hypertension: Secondary | ICD-10-CM

## 2020-06-23 DIAGNOSIS — R58 Hemorrhage, not elsewhere classified: Secondary | ICD-10-CM

## 2020-06-23 DIAGNOSIS — M25561 Pain in right knee: Secondary | ICD-10-CM

## 2020-06-23 MED ORDER — AMLODIPINE BESYLATE 10 MG PO TABS: 10.0000 mg | ORAL_TABLET | Freq: Every day | ORAL | 1 refills | Status: DC

## 2020-06-23 MED ORDER — GENTAMICIN SULFATE 0.3 % OP SOLN: 1.0000 [drp] | Freq: Three times a day (TID) | OPHTHALMIC | 0 refills | Status: DC

## 2020-06-23 MED FILL — GENTAMICIN 3 MG/ML EYE DRP: 0.3 | 12 days supply | Qty: 5 | Fill #0

## 2020-06-23 NOTE — Progress Notes (Signed)
Red/burning eyes for 4 days have not tried anything  Right knee swelling Pt tripped year ago injured knee now giving more problems

## 2020-06-23 NOTE — Progress Notes (Signed)
Established Patient Office Visit  Subjective:  Patient ID: Colleen Terrell, female    DOB: Dec 03, 1980  Age: 39 y.o. MRN: [id number]  CC:  Chief Complaint  Patient presents with  . Follow-up    HPI Colleen Terrell, 39 year old female, who is seen in follow-up of hypertension for which she is currently on amlodipine and she reports no issues with her blood pressure.  She denies any headaches or dizziness related to her blood pressure.  She is concerned however because she has recurrent issues with pain and swelling in the right knee since twisting her knee last August.  She did see orthopedics and had a steroid injection into her knee but did not get any prolonged relief of her knee pain.  She does continue to take nonsteroidal anti-inflammatory medication as well as acetaminophen to help with the knee pain.          She also has complaint of recently awakening and having a blood clot in her mouth which she believes came from her lower teeth are beneath her tongue.  She states that the blood clot occurred on the same day as the first night of her menses and she wonders if the hormonal changes from her menses may have caused her to have the blood clot in her mouth.  She does currently take a daily aspirin as she believes that this will help with heart health.  She denies any current issues with acid reflux and no abdominal pain.  She has had no blood in the stool and no black stools.  Past Medical History:  Diagnosis Date  . Complication of anesthesia    pain with surgery  . GERD (gastroesophageal reflux disease)   . History of kidney infection during pregnancy   . Hypertension   . PP NVB 02/02/12 02/02/2012  . Unspecified hemorrhoids without mention of complication     Past Surgical History:  Procedure Laterality Date  . CESAREAN SECTION N/A 09/13/2014   Procedure: CESAREAN SECTION;  Surgeon: Levie Heritage, DO;  Location: [facility];  Service: Obstetrics;  Laterality: N/A;  .  CESAREAN SECTION MULTI-GESTATIONAL N/A 03/19/2018   Procedure: REPEAT CESAREAN SECTION MULTI-GESTATIONAL;  Surgeon: Federico Flake, MD;  Location: [facility] BIRTHING SUITES;  Service: Obstetrics;  Laterality: N/A;    Family History  Problem Relation Age of Onset  . Hypertension Father   . Hypertension Mother   . Anesthesia problems Neg Hx     Social History   Socioeconomic History  . Marital status: Married    Spouse name: Not on file  . Number of children: Not on file  . Years of education: Not on file  . Highest education level: Not on file  Occupational History  . Not on file  Tobacco Use  . Smoking status: Never Smoker  . Smokeless tobacco: Never Used  Substance and Sexual Activity  . Alcohol use: No  . Drug use: No  . Sexual activity: Not Currently    Birth control/protection: None  Other Topics Concern  . Not on file  Social History Narrative  . Not on file   Social Determinants of Health   Financial Resource Strain:   . Difficulty of Paying Living Expenses: Not on file  Food Insecurity:   . Worried About Programme researcher, broadcasting/film/video in the Last Year: Not on file  . Ran Out of Food in the Last Year: Not on file  Transportation Needs:   . Lack of Transportation (Medical): Not on file  .  Lack of Transportation (Non-Medical): Not on file  Physical Activity:   . Days of Exercise per Week: Not on file  . Minutes of Exercise per Session: Not on file  Stress:   . Feeling of Stress : Not on file  Social Connections:   . Frequency of Communication with Friends and Family: Not on file  . Frequency of Social Gatherings with Friends and Family: Not on file  . Attends Religious Services: Not on file  . Active Member of Clubs or Organizations: Not on file  . Attends Banker Meetings: Not on file  . Marital Status: Not on file  Intimate Partner Violence:   . Fear of Current or Ex-Partner: Not on file  . Emotionally Abused: Not on file  . Physically Abused: Not on  file  . Sexually Abused: Not on file    Outpatient Medications Prior to Visit  Medication Sig Dispense Refill  . ibuprofen (ADVIL) 600 MG tablet Take 1 tablet (600 mg total) by mouth every 8 (eight) hours as needed for moderate pain. Take after eating 60 tablet 1  . amLODipine (NORVASC) 10 MG tablet TAKE 1 TABLET (10 MG TOTAL) BY MOUTH DAILY. TO LOWER BLOOD PRESSURE 90 tablet 0  . docusate sodium (COLACE) 100 MG capsule Take 1 capsule (100 mg total) by mouth 2 (two) times daily. (Patient not taking: Reported on 05/14/2018) 60 capsule 0  . ferrous sulfate 325 (65 FE) MG tablet Take 1 tablet (325 mg total) by mouth 2 (two) times daily. (Patient not taking: Reported on 08/03/2019) 60 tablet 3  . medroxyPROGESTERone (DEPO-PROVERA) 150 MG/ML injection Inject 1 mL (150 mg total) into the muscle every 3 (three) months. (Patient not taking: Reported on 08/03/2019) 1 mL 0  . Prenatal Multivit-Min-Fe-FA (PRENATAL VITAMINS) 0.8 MG tablet Take 1 tablet by mouth daily. (Patient not taking: Reported on 08/03/2019) 30 tablet 12   No facility-administered medications prior to visit.    No Known Allergies  ROS Review of Systems  Constitutional: Negative for chills and fever.  HENT: Negative for sore throat and trouble swallowing.   Eyes: Positive for discharge, redness and itching. Negative for photophobia and visual disturbance.  Respiratory: Negative for cough and shortness of breath.   Cardiovascular: Negative for chest pain and palpitations.  Gastrointestinal: Negative for abdominal pain, constipation, diarrhea and nausea.  Endocrine: Negative for polydipsia, polyphagia and polyuria.  Genitourinary: Negative for dysuria and frequency.  Musculoskeletal: Positive for arthralgias, gait problem and joint swelling.  Skin: Negative for rash and wound.  Neurological: Negative for dizziness and headaches.  Hematological: Negative for adenopathy. Does not bruise/bleed easily.       Reports awakening and having  a blood clot in her mouth on the night of Oct 15th on the same night that her period started  Psychiatric/Behavioral: Negative for suicidal ideas. The patient is not nervous/anxious.       Objective:    Physical Exam Constitutional:      General: She is not in acute distress.    Appearance: Normal appearance. She is obese.  HENT:     Nose: Congestion and rhinorrhea present.     Comments: Pale edematous nasal turbinates with clear to white nasal drainage Eyes:     Extraocular Movements: Extraocular movements intact.     Comments: Mild erythema of the bilateral conjunctiva  Cardiovascular:     Rate and Rhythm: Normal rate and regular rhythm.  Pulmonary:     Effort: Pulmonary effort is normal.  Breath sounds: Normal breath sounds.  Abdominal:     Palpations: Abdomen is soft.     Tenderness: There is no abdominal tenderness. There is no right CVA tenderness, left CVA tenderness, guarding or rebound.  Musculoskeletal:        General: Tenderness (edema of the right upper out knee and some discomfort with stressing of lateral knee ligament) present.     Right lower leg: No edema.     Left lower leg: No edema.  Skin:    General: Skin is warm and dry.  Neurological:     General: No focal deficit present.     Mental Status: She is alert and oriented to person, place, and time.  Psychiatric:        Mood and Affect: Mood normal.        Behavior: Behavior normal.     BP 124/85 (BP Location: Right Arm, Patient Position: Sitting)   Pulse 87   Temp 98.4 F (36.9 C)   Wt 279 lb 12.8 oz (126.9 kg)   LMP 06/10/2020   SpO2 100%   BMI 41.32 kg/m  Wt Readings from Last 3 Encounters:  06/23/20 279 lb 12.8 oz (126.9 kg)  09/30/19 278 lb 3.2 oz (126.2 kg)  08/03/19 278 lb 11.2 oz (126.4 kg)     Health Maintenance Due  Topic Date Due  . Hepatitis C Screening  Never done  . COVID-19 Vaccine (1) Never done  . INFLUENZA VACCINE  03/27/2020     No results found for: TSH Lab  Results  Component Value Date   WBC 7.7 10/24/2018   HGB 12.9 10/24/2018   HCT 40.3 10/24/2018   MCV 85 10/24/2018   PLT 352 10/24/2018   Lab Results  Component Value Date   NA 140 09/30/2019   K 4.1 09/30/2019   CO2 22 09/30/2019   GLUCOSE 115 (H) 09/30/2019   BUN 11 09/30/2019   CREATININE 0.70 09/30/2019   BILITOT 0.5 03/29/2018   ALKPHOS 83 03/29/2018   AST 27 03/29/2018   ALT 37 03/29/2018   PROT 6.3 (L) 03/29/2018   ALBUMIN 3.0 (L) 03/29/2018   CALCIUM 9.0 09/30/2019   ANIONGAP 10 03/29/2018   Lab Results  Component Value Date   CHOL 137 07/31/2018   Lab Results  Component Value Date   HDL 36 (L) 07/31/2018   Lab Results  Component Value Date   LDLCALC 86 07/31/2018   Lab Results  Component Value Date   TRIG 76 07/31/2018   Lab Results  Component Value Date   CHOLHDL 3.8 07/31/2018   Lab Results  Component Value Date   HGBA1C 5.9 (H) 09/30/2019      Assessment & Plan:  1. Essential hypertension Blood pressure is well controlled at today's visit.  Continue amlodipine 10 mg, refill provided.  Will check electrolytes/creatinine at today's visit. - Basic Metabolic Panel - amLODipine (NORVASC) 10 MG tablet; Take 1 tablet (10 mg total) by mouth daily. To lower blood pressure  Dispense: 90 tablet; Refill: 1  2. Chronic pain of right knee She reports chronic pain of the right knee after tripping and injuring her knee in the past.  She reports continued issues with recurrent knee pain and swelling.  She will be referred to orthopedic surgery for further evaluation and treatment.  She has been asked to discontinue the use of nonsteroidal anti-inflammatories and her request for refill of ibuprofen 600 mg was not done at today's visit due to her current issues with  bleeding.  She may take over-the-counter Tylenol arthritis as needed for knee pain and is being referred to orthopedics. - Ambulatory referral to Orthopedic Surgery  3. Prediabetes Patient was to  have hemoglobin A1c at today's visit but this was not done for some reason.  She did have elevated hemoglobin A1c of 5.9 on 09/30/2019.  She will have BMP in follow-up. - Basic Metabolic Panel  4. Bleeding She reports unusual bleeding as she woke up in the middle of the night and had a blood clot in her mouth.  She does report that she has been taking a daily aspirin as she thought that this would help with heart health.  She has been asked to stop the use of daily aspirin as this may be contributing to her bleeding issues.  She should also stop the use of nonsteroidal anti-inflammatory medication for treatment of knee pain.  She will have CBC at today's visit and will be notified if further treatment/evaluation is needed based on the results.  She is also asked to report any new bleeding as she may need hematology work-up if continued issues with bleeding. - CBC with Differential  5. Conjunctivitis of both eyes, unspecified conjunctivitis type Patient with evidence of conjunctivitis on examination and she reports that her children have also had recent similar symptoms.  Prescription provided for Garamycin ophthalmic drops.  She is also encouraged to take over-the-counter antihistamines if needed for itchy eyes or an over-the-counter allergy eyedrop if needed. - gentamicin (GARAMYCIN) 0.3 % ophthalmic solution; Place 1 drop into both eyes 3 (three) times daily for 7 days.  Dispense: 5 mL; Refill: 0   Meds ordered this encounter  Medications  . gentamicin (GARAMYCIN) 0.3 % ophthalmic solution    Sig: Place 1 drop into both eyes 3 (three) times daily for 7 days.    Dispense:  5 mL    Refill:  0  . amLODipine (NORVASC) 10 MG tablet    Sig: Take 1 tablet (10 mg total) by mouth daily. To lower blood pressure    Dispense:  90 tablet    Refill:  1     Follow-up: Return in about 4 months (around 10/24/2020) for chronic issues; sooner if continued bleeding or any issues.   Cain Saupeammie Alva Broxson, MD

## 2020-06-23 NOTE — Patient Instructions (Signed)
Please stop daily aspirin. Take over the counter Tylenol Arthritis or generic to help with knee pain.

## 2020-06-24 LAB — BASIC METABOLIC PANEL WITH GFR
BUN/Creatinine Ratio: 13 (ref 9–23)
BUN: 12 mg/dL (ref 6–24)
CO2: 23 mmol/L (ref 20–29)
Calcium: 9.1 mg/dL (ref 8.7–10.2)
Chloride: 101 mmol/L (ref 96–106)
Creatinine, Ser: 0.96 mg/dL (ref 0.57–1.00)
GFR calc Af Amer: 85 mL/min/1.73
GFR calc non Af Amer: 74 mL/min/1.73
Glucose: 156 mg/dL — ABNORMAL HIGH (ref 65–99)
Potassium: 3.8 mmol/L (ref 3.5–5.2)
Sodium: 139 mmol/L (ref 134–144)

## 2020-06-24 LAB — CBC WITH DIFFERENTIAL/PLATELET
Basophils Absolute: 0 x10E3/uL (ref 0.0–0.2)
Basos: 0 %
EOS (ABSOLUTE): 0.3 x10E3/uL (ref 0.0–0.4)
Eos: 3 %
Hematocrit: 36.8 % (ref 34.0–46.6)
Hemoglobin: 11.5 g/dL (ref 11.1–15.9)
Immature Grans (Abs): 0 x10E3/uL (ref 0.0–0.1)
Immature Granulocytes: 0 %
Lymphocytes Absolute: 4.1 x10E3/uL — ABNORMAL HIGH (ref 0.7–3.1)
Lymphs: 44 %
MCH: 26.1 pg — ABNORMAL LOW (ref 26.6–33.0)
MCHC: 31.3 g/dL — ABNORMAL LOW (ref 31.5–35.7)
MCV: 84 fL (ref 79–97)
Monocytes Absolute: 0.4 x10E3/uL (ref 0.1–0.9)
Monocytes: 4 %
Neutrophils Absolute: 4.4 x10E3/uL (ref 1.4–7.0)
Neutrophils: 49 %
Platelets: 336 x10E3/uL (ref 150–450)
RBC: 4.4 x10E6/uL (ref 3.77–5.28)
RDW: 13.8 % (ref 11.7–15.4)
WBC: 9.2 x10E3/uL (ref 3.4–10.8)

## 2020-06-27 ENCOUNTER — Encounter: Payer: Self-pay | Admitting: Family Medicine

## 2020-08-29 ENCOUNTER — Other Ambulatory Visit: Payer: Medicaid Other

## 2020-10-14 ENCOUNTER — Other Ambulatory Visit: Payer: Self-pay | Admitting: Pharmacist

## 2020-10-14 DIAGNOSIS — I10 Essential (primary) hypertension: Secondary | ICD-10-CM

## 2020-10-14 MED ORDER — AMLODIPINE BESYLATE 10 MG PO TABS: 10.0000 mg | ORAL_TABLET | Freq: Every day | ORAL | 0 refills | Status: DC

## 2020-10-23 NOTE — Progress Notes (Signed)
Subjective:    Patient ID: Colleen Terrell, female    DOB: November 21, 1980, 40 y.o.   MRN: [id number]  40 y.o.F  Former [facility] pt Hx HTN  10/24/20 This is a former Dr. Jillyn Hidden patient here to transition care last seen in our clinic October 2021 Below is Dr. Debroah Baller last note At [facility] 05/2020 with [facility]: Colleen Terrell, 40 year old female, who is seen in follow-up of hypertension for which she is currently on amlodipine and she reports no issues with her blood pressure.  She denies any headaches or dizziness related to her blood pressure.  She is concerned however because she has recurrent issues with pain and swelling in the right knee since twisting her knee last August.  She did see orthopedics and had a steroid injection into her knee but did not get any prolonged relief of her knee pain.  She does continue to take nonsteroidal anti-inflammatory medication as well as acetaminophen to help with the knee pain.          She also has complaint of recently awakening and having a blood clot in her mouth which she believes came from her lower teeth are beneath her tongue.  She states that the blood clot occurred on the same day as the first night of her menses and she wonders if the hormonal changes from her menses may have caused her to have the blood clot in her mouth.  She does currently take a daily aspirin as she believes that this will help with heart health.  She denies any current issues with acid reflux and no abdominal pain.  She has had no blood in the stool and no black stools.  Essential hypertension Blood pressure is well controlled at today's visit.  Continue amlodipine 10 mg, refill provided.  Will check electrolytes/creatinine at today's visit. - Basic Metabolic Panel - amLODipine (NORVASC) 10 MG tablet; Take 1 tablet (10 mg total) by mouth daily. To lower blood pressure  Dispense: 90 tablet; Refill: 1  2. Chronic pain of right knee She reports chronic pain of the right knee  after tripping and injuring her knee in the past.  She reports continued issues with recurrent knee pain and swelling.  She will be referred to orthopedic surgery for further evaluation and treatment.  She has been asked to discontinue the use of nonsteroidal anti-inflammatories and her request for refill of ibuprofen 600 mg was not done at today's visit due to her current issues with bleeding.  She may take over-the-counter Tylenol arthritis as needed for knee pain and is being referred to orthopedics. - Ambulatory referral to Orthopedic Surgery  3. Prediabetes Patient was to have hemoglobin A1c at today's visit but this was not done for some reason.  She did have elevated hemoglobin A1c of 5.9 on 09/30/2019.  She will have BMP in follow-up. - Basic Metabolic Panel  4. Bleeding She reports unusual bleeding as she woke up in the middle of the night and had a blood clot in her mouth.  She does report that she has been taking a daily aspirin as she thought that this would help with heart health.  She has been asked to stop the use of daily aspirin as this may be contributing to her bleeding issues.  She should also stop the use of nonsteroidal anti-inflammatory medication for treatment of knee pain.  She will have CBC at today's visit and will be notified if further treatment/evaluation is needed based on the results.  She  is also asked to report any new bleeding as she may need hematology work-up if continued issues with bleeding. - CBC with Differential  5. Conjunctivitis of both eyes, unspecified conjunctivitis type Patient with evidence of conjunctivitis on examination and she reports that her children have also had recent similar symptoms.  Prescription provided for Garamycin ophthalmic drops.  She is also encouraged to take over-the-counter antihistamines if needed for itchy eyes or an over-the-counter allergy eyedrop if needed. - gentamicin (GARAMYCIN) 0.3 % ophthalmic solution; Place 1 drop into  both eyes 3 (three) times daily for 7 days.  Dispense: 5 mL; Refill: 0  Patient states she is no longer bleeding in the mouth and she has no further eye issues.  Note hemoglobin A1c was in the prediabetic range sugars are only mildly elevated she is on diet control alone.  Biggest complaint today is chronic right knee pain which again persisted and she has had another fall since being seen in October.  She has swelling in the lateral aspect of the right leg.  Note on arrival blood pressure is good and she maintains the amlodipine without change  No other complaints at this time and she does have [employer] insurance and is agreeable to orthopedic referral.  She is an immigrant from [place].  She has 6 children ages age 89-1/2-14.  She has 2 sets of twins and she works as a Therapist, sports.   Past Medical History:  Diagnosis Date  . Complication of anesthesia    pain with surgery  . GERD (gastroesophageal reflux disease)   . History of kidney infection during pregnancy   . Hypertension   . PP NVB 02/02/12 02/02/2012  . Unspecified hemorrhoids without mention of complication      Family History  Problem Relation Age of Onset  . Hypertension Father   . Hypertension Mother   . Anesthesia problems Neg Hx      Social History   Socioeconomic History  . Marital status: Married    Spouse name: Not on file  . Number of children: Not on file  . Years of education: Not on file  . Highest education level: Not on file  Occupational History  . Not on file  Tobacco Use  . Smoking status: Never Smoker  . Smokeless tobacco: Never Used  Substance and Sexual Activity  . Alcohol use: No  . Drug use: No  . Sexual activity: Not Currently    Birth control/protection: None  Other Topics Concern  . Not on file  Social History Narrative  . Not on file   Social Determinants of Health   Financial Resource Strain: Not on file  Food Insecurity: Not on file  Transportation Needs: Not on file   Physical Activity: Not on file  Stress: Not on file  Social Connections: Not on file  Intimate Partner Violence: Not on file     No Known Allergies   Outpatient Medications Prior to Visit  Medication Sig Dispense Refill  . ibuprofen (ADVIL) 600 MG tablet Take 1 tablet (600 mg total) by mouth every 8 (eight) hours as needed for moderate pain. Take after eating 60 tablet 1  . amLODipine (NORVASC) 10 MG tablet Take 1 tablet (10 mg total) by mouth daily. To lower blood pressure 30 tablet 0  . docusate sodium (COLACE) 100 MG capsule Take 1 capsule (100 mg total) by mouth 2 (two) times daily. (Patient not taking: Reported on 05/14/2018) 60 capsule 0  . ferrous sulfate 325 (65 FE)  MG tablet Take 1 tablet (325 mg total) by mouth 2 (two) times daily. (Patient not taking: Reported on 08/03/2019) 60 tablet 3  . medroxyPROGESTERone (DEPO-PROVERA) 150 MG/ML injection Inject 1 mL (150 mg total) into the muscle every 3 (three) months. (Patient not taking: Reported on 08/03/2019) 1 mL 0  . Prenatal Multivit-Min-Fe-FA (PRENATAL VITAMINS) 0.8 MG tablet Take 1 tablet by mouth daily. (Patient not taking: No sig reported) 30 tablet 12   No facility-administered medications prior to visit.    Review of Systems  Constitutional: Negative.   HENT: Positive for dental problem. Negative for ear discharge, nosebleeds, postnasal drip, rhinorrhea, sinus pressure, sinus pain and sneezing.   Eyes: Negative.   Respiratory: Negative.   Cardiovascular: Negative.   Gastrointestinal: Negative.   Endocrine: Negative for polydipsia, polyphagia and polyuria.  Genitourinary: Negative.   Musculoskeletal:       Right knee pain  Skin: Negative.   Neurological: Negative.   Psychiatric/Behavioral: Negative for dysphoric mood, hallucinations, self-injury and suicidal ideas. The patient is not nervous/anxious and is not hyperactive.        Objective:   Physical Exam Vitals:   10/24/20 0932  BP: 128/74  Pulse: 87  Resp: 16   SpO2: 100%  Weight: 273 lb (123.8 kg)  Height: 5\' 9"  (1.753 m)    Gen: Pleasant, well-nourished, in no distress,  normal affect  ENT: No lesions,  mouth clear,  oropharynx clear, no postnasal drip  Neck: No JVD, no TMG, no carotid bruits  Lungs: No use of accessory muscles, no dullness to percussion, clear without rales or rhonchi  Cardiovascular: RRR, heart sounds normal, no murmur or gallops, no peripheral edema  Abdomen: soft and NT, no HSM,  BS normal  Musculoskeletal: No deformities, no cyanosis or clubbing, tender right lateral knee and the lateral collateral ligament area  Neuro: alert, non focal  Skin: Warm, no lesions or rashes  No results found.        Assessment & Plan:  I personally reviewed all images and lab data in the [facility] system as well as any outside material available during this office visit and agree with the  radiology impressions.   Chronic pain of right knee Referral to orthopedic surgery made patient recommend to wear a knee brace until she is seen by orthopedic surgery  Chronic hypertension Continue amlodipine  Frequent falls May yet need physical therapy  Morbid obesity [facility]) Referral to medical nutritionist made   Stephanye was seen today for follow-up.  Diagnoses and all orders for this visit:  Chronic pain of right knee -     Ambulatory referral to Orthopedic Surgery  Morbid obesity (HCC) -     Amb ref to Medical Nutrition Therapy-MNT  Frequent falls -     Ambulatory referral to Orthopedic Surgery  Need for hepatitis C screening test -     HCV Ab w Reflex to Quant PCR  Chronic hypertension  Other orders -     amLODipine (NORVASC) 10 MG tablet; Take 1 tablet (10 mg total) by mouth daily.

## 2020-10-24 ENCOUNTER — Encounter: Payer: Self-pay | Admitting: Critical Care Medicine

## 2020-10-24 ENCOUNTER — Ambulatory Visit: Payer: 59 | Attending: Critical Care Medicine | Admitting: Critical Care Medicine

## 2020-10-24 ENCOUNTER — Other Ambulatory Visit: Payer: Self-pay

## 2020-10-24 ENCOUNTER — Ambulatory Visit: Payer: Medicaid Other | Admitting: Family Medicine

## 2020-10-24 VITALS — BP 128/74 | HR 87 | Resp 16 | Ht 69.0 in | Wt 273.0 lb

## 2020-10-24 DIAGNOSIS — M25561 Pain in right knee: Secondary | ICD-10-CM | POA: Diagnosis not present

## 2020-10-24 DIAGNOSIS — I10 Essential (primary) hypertension: Secondary | ICD-10-CM

## 2020-10-24 DIAGNOSIS — Z7901 Long term (current) use of anticoagulants: Secondary | ICD-10-CM | POA: Diagnosis not present

## 2020-10-24 DIAGNOSIS — R296 Repeated falls: Secondary | ICD-10-CM | POA: Insufficient documentation

## 2020-10-24 DIAGNOSIS — Z713 Dietary counseling and surveillance: Secondary | ICD-10-CM | POA: Diagnosis not present

## 2020-10-24 DIAGNOSIS — H109 Unspecified conjunctivitis: Secondary | ICD-10-CM | POA: Insufficient documentation

## 2020-10-24 DIAGNOSIS — R7303 Prediabetes: Secondary | ICD-10-CM | POA: Diagnosis not present

## 2020-10-24 DIAGNOSIS — G8929 Other chronic pain: Secondary | ICD-10-CM | POA: Insufficient documentation

## 2020-10-24 DIAGNOSIS — Z8249 Family history of ischemic heart disease and other diseases of the circulatory system: Secondary | ICD-10-CM | POA: Diagnosis not present

## 2020-10-24 DIAGNOSIS — Z79899 Other long term (current) drug therapy: Secondary | ICD-10-CM | POA: Insufficient documentation

## 2020-10-24 DIAGNOSIS — Z1159 Encounter for screening for other viral diseases: Secondary | ICD-10-CM | POA: Insufficient documentation

## 2020-10-24 DIAGNOSIS — M1711 Unilateral primary osteoarthritis, right knee: Secondary | ICD-10-CM | POA: Insufficient documentation

## 2020-10-24 MED ORDER — AMLODIPINE BESYLATE 10 MG PO TABS: 10.0000 mg | ORAL_TABLET | Freq: Every day | ORAL | 3 refills | Status: DC

## 2020-10-24 NOTE — Patient Instructions (Addendum)
Referral to orthopedic surgery was made  Referral to nutritionist made for your obesity to help you with dietary recommendations  Continue amlodipine refill sent to [facility]  Please obtain a right knee brace until you see orthopedics, you can also use over-the-counter Tylenol or ibuprofen for pain  Be very careful going up and down stairs with your knee until you see orthopedic surgery  Lab days hepatitis C assay  Return Dr Delford Field 3 months  Please obtain your COVID vaccine booster below is where you can find vaccine COVID-19 Vaccine Information can be found at: [email or web address] For questions related to vaccine distribution or appointments, please email [email or web address] or call [phone].

## 2020-10-24 NOTE — Progress Notes (Signed)
C/o right leg pain trip and fall 1 year ago

## 2020-10-24 NOTE — Assessment & Plan Note (Signed)
-   Continue amlodipine ?

## 2020-10-24 NOTE — Assessment & Plan Note (Signed)
May yet need physical therapy

## 2020-10-24 NOTE — Assessment & Plan Note (Signed)
Referral to medical nutritionist made

## 2020-10-24 NOTE — Assessment & Plan Note (Signed)
Referral to orthopedic surgery made patient recommend to wear a knee brace until she is seen by orthopedic surgery

## 2020-10-25 LAB — HCV AB W REFLEX TO QUANT PCR: HCV Ab: <0.1 s/co ratio (ref 0.0–0.9)

## 2020-10-25 LAB — HCV INTERPRETATION

## 2020-11-01 ENCOUNTER — Ambulatory Visit (INDEPENDENT_AMBULATORY_CARE_PROVIDER_SITE_OTHER): Payer: 59 | Admitting: Orthopaedic Surgery

## 2020-11-01 ENCOUNTER — Encounter: Payer: Self-pay | Admitting: Orthopaedic Surgery

## 2020-11-01 ENCOUNTER — Ambulatory Visit (INDEPENDENT_AMBULATORY_CARE_PROVIDER_SITE_OTHER): Payer: 59

## 2020-11-01 ENCOUNTER — Encounter: Payer: Self-pay | Admitting: *Deleted

## 2020-11-01 DIAGNOSIS — G8929 Other chronic pain: Secondary | ICD-10-CM | POA: Diagnosis not present

## 2020-11-01 DIAGNOSIS — M25561 Pain in right knee: Secondary | ICD-10-CM | POA: Diagnosis not present

## 2020-11-01 MED ORDER — DICLOFENAC SODIUM 75 MG PO TBEC: 75.0000 mg | DELAYED_RELEASE_TABLET | Freq: Two times a day (BID) | ORAL | 0 refills | Status: DC | PRN

## 2020-11-01 NOTE — Progress Notes (Signed)
Office Visit Note   Patient: Colleen Terrell           Date of Birth: 14-May-1981           MRN: [id number] Visit Date: 11/01/2020              Requested by: Storm Frisk, MD [address] [place],  [place] [address] PCP: Cain Saupe, MD (Inactive)   Assessment & Plan: Visit Diagnoses:  1. 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  We discussed various treatment options to include aspiration and injection as well as stretching and weight loss.  She would like for me to call in a different anti-inflammatory to try first.  She will work on weight loss in the meantime.  I provided her with the and IT band stretches.  Follow-up with [place] as needed.  Follow-Up Instructions: No follow-ups on file.   Orders:  Orders Placed This Encounter  Procedures  . XR KNEE 3 VIEW RIGHT   No orders of the defined types were placed in this encounter.     Procedures: No procedures performed   Clinical Data: No additional findings.   Subjective: Chief Complaint  Patient presents with  . Right Knee - Pain    HPI patient is a pleasant 40 year old female with history of right knee pain for the past 2 years.  She has associated swelling.  No mechanical symptoms or instability.  Walking and standing seem to aggravate her symptoms most.  She has tried over-the-counter NSAIDs without relief of symptoms.  She does note a previous cortisone injection which did not help.  Review of Systems as detailed in HPI.  All others reviewed and are negative.   Objective: Vital Signs: There were no vitals taken for this visit.  Physical Exam well-developed well-nourished female no acute distress.  Alert oriented x3  Ortho Exam right knee exam shows a small to moderate effusion.  Range of motion 0 to 125 degrees.  No joint line tenderness.  Moderate patellofemoral crepitus.  Ligaments are stable.  She does have  increased pain along the lateral femoral condyle with external rotation of the hip.  She is neurovascular intact distally.  Specialty Comments:  No specialty comments available.  Imaging: XR KNEE 3 VIEW RIGHT  Result Date: 11/01/2020 Moderate degenerative changes to the medial and patellofemoral compartments    PMFS History: Patient Active Problem List   Diagnosis Date Noted  . Chronic pain of right knee 10/24/2020  . Frequent falls 10/24/2020  . Morbid obesity (HCC) 10/24/2020  . Encounter for surveillance of injectable contraceptive 04/24/2018  . Chronic hypertension 03/27/2018  . S/P cesarean section 03/19/2018   Past Medical History:  Diagnosis Date  . Complication of anesthesia    pain with surgery  . GERD (gastroesophageal reflux disease)   . History of kidney infection during pregnancy   . Hypertension   . PP NVB 02/02/12 02/02/2012  . Unspecified hemorrhoids without mention of complication     Family History  Problem Relation Age of Onset  . Hypertension Father   . Hypertension Mother   . Anesthesia problems Neg Hx     Past Surgical History:  Procedure Laterality Date  . CESAREAN SECTION N/A 09/13/2014   Procedure: CESAREAN SECTION;  Surgeon: Levie Heritage, DO;  Location: [facility];  Service: Obstetrics;  Laterality: N/A;  . CESAREAN SECTION MULTI-GESTATIONAL N/A 03/19/2018  Procedure: REPEAT CESAREAN SECTION MULTI-GESTATIONAL;  Surgeon: Federico Flake, MD;  Location: [facility] BIRTHING SUIT[facility];  Service: Obstetrics;  Laterality: N/A;   Social History   Occupational History  . Not on file  Tobacco Use  . Smoking status: Never Smoker  . Smokeless tobacco: Never Used  Substance and Sexual Activity  . Alcohol use: No  . Drug use: No  . Sexual activity: Not Currently    Birth control/protection: None

## 2020-11-21 ENCOUNTER — Ambulatory Visit: Payer: Medicaid Other | Admitting: Registered"

## 2021-01-19 ENCOUNTER — Other Ambulatory Visit: Payer: Self-pay

## 2021-01-19 ENCOUNTER — Ambulatory Visit: Payer: 59 | Attending: Critical Care Medicine | Admitting: Nurse Practitioner

## 2021-01-19 DIAGNOSIS — M25561 Pain in right knee: Secondary | ICD-10-CM | POA: Diagnosis not present

## 2021-01-19 DIAGNOSIS — G8929 Other chronic pain: Secondary | ICD-10-CM

## 2021-01-19 MED ORDER — IBUPROFEN 600 MG PO TABS: 600.0000 mg | ORAL_TABLET | Freq: Three times a day (TID) | ORAL | 1 refills | Status: DC | PRN

## 2021-01-19 MED ORDER — AMLODIPINE BESYLATE 10 MG PO TABS: 10.0000 mg | ORAL_TABLET | Freq: Every day | ORAL | 3 refills | Status: DC

## 2021-01-19 NOTE — Progress Notes (Signed)
@Patient  ID: , female    DOB: June 09, 1981, 40 y.o.   MRN: [id number]  Chief Complaint  Patient presents with  . Hypertension  . Knee Pain    Referring provider: No ref. provider found    HPI       No Known Allergies  Immunization History  Administered Date(s) Administered  . Influenza,inj,Quad PF,6+ Mos 05/13/2014, 09/06/2017, 06/17/2018  . PFIZER(Purple Top)SARS-COV-2 Vaccination 11/26/2019, 12/16/2019  . Tdap 02/03/2012, 02/24/2013, 07/08/2014, 02/14/2018    Past Medical History:  Diagnosis Date  . Complication of anesthesia    pain with surgery  . GERD (gastroesophageal reflux disease)   . History of kidney infection during pregnancy   . Hypertension   . PP NVB 02/02/12 02/02/2012  . Unspecified hemorrhoids without mention of complication     Tobacco History: Social History   Tobacco Use  Smoking Status Never Smoker  Smokeless Tobacco Never Used   Counseling given: Not Answered   Outpatient Encounter Medications as of 01/19/2021  Medication Sig  . amLODipine (NORVASC) 10 MG tablet Take 1 tablet (10 mg total) by mouth daily.  . diclofenac (VOLTAREN) 75 MG EC tablet Take 1 tablet (75 mg total) by mouth 2 (two) times daily as needed.  01/21/2021 gentamicin (GARAMYCIN) 0.3 % ophthalmic solution PLACE 1 DROP INTO BOTH EYES 3 (THREE) TIMES DAILY FOR 7 DAYS.  Marland Kitchen ibuprofen (ADVIL) 600 MG tablet Take 1 tablet (600 mg total) by mouth every 8 (eight) hours as needed for moderate pain. Take after eating   No facility-administered encounter medications on file as of 01/19/2021.     Review of Systems  Review of Systems     Physical Exam  BP 120/82   Pulse 73   Ht 5\' 9"  (1.753 m)   Wt 269 lb 12.8 oz (122.4 kg)   SpO2 99%   BMI 39.84 kg/m   Wt Readings from Last 5 Encounters:  01/19/21 269 lb 12.8 oz (122.4 kg)  10/24/20 273 lb (123.8 kg)  06/23/20 279 lb 12.8 oz (126.9 kg)  09/30/19 278 lb 3.2 oz (126.2 kg)  08/03/19 278 lb 11.2 oz (126.4  kg)     Physical Exam   Lab Results:  CBC    Component Value Date/Time   WBC 9.2 06/23/2020 1640   WBC 16.9 (H) 03/29/2018 1957   RBC 4.40 06/23/2020 1640   RBC 3.13 (L) 03/29/2018 1957   HGB 11.5 06/23/2020 1640   HCT 36.8 06/23/2020 1640   PLT 336 06/23/2020 1640   MCV 84 06/23/2020 1640   MCH 26.1 (L) 06/23/2020 1640   MCH 25.9 (L) 03/29/2018 1957   MCHC 31.3 (L) 06/23/2020 1640   MCHC 31.3 03/29/2018 1957   RDW 13.8 06/23/2020 1640   LYMPHSABS 4.1 (H) 06/23/2020 1640   MONOABS 0.3 03/19/2018 1118   EOSABS 0.3 06/23/2020 1640   BASOSABS 0.0 06/23/2020 1640    BMET    Component Value Date/Time   NA 139 06/23/2020 1640   K 3.8 06/23/2020 1640   CL 101 06/23/2020 1640   CO2 23 06/23/2020 1640   GLUCOSE 156 (H) 06/23/2020 1640   GLUCOSE 117 (H) 03/29/2018 1957   BUN 12 06/23/2020 1640   CREATININE 0.96 06/23/2020 1640   CREATININE 0.67 09/02/2014 1505   CALCIUM 9.1 06/23/2020 1640   GFRNONAA 74 06/23/2020 1640   GFRAA 85 06/23/2020 1640    BNP No results found for: BNP  ProBNP No results found for: PROBNP  Imaging: No results found.  Assessment & Plan:   No problem-specific Assessment & Plan notes found for this encounter.     Ivonne Andrew, NP 01/19/2021

## 2021-01-19 NOTE — Patient Instructions (Addendum)
Continue to follow with ortho  Will replace Referral to nutritionist made for your obesity to help you with dietary recommendations  Continue amlodipine refill sent to [facility]  Continue to follow with orthopedics   Follow up:  Return Dr Delford Field 3 months

## 2021-02-08 ENCOUNTER — Other Ambulatory Visit: Payer: Self-pay | Admitting: Nurse Practitioner

## 2021-02-08 DIAGNOSIS — Z1231 Encounter for screening mammogram for malignant neoplasm of breast: Secondary | ICD-10-CM

## 2021-04-05 ENCOUNTER — Ambulatory Visit
Admission: RE | Admit: 2021-04-05 | Discharge: 2021-04-05 | Disposition: A | Discharge status: Home / Self Care | Payer: 59 | Source: Ambulatory Visit | Attending: Nurse Practitioner | Admitting: Nurse Practitioner

## 2021-04-05 ENCOUNTER — Other Ambulatory Visit: Payer: Self-pay

## 2021-04-05 DIAGNOSIS — Z1231 Encounter for screening mammogram for malignant neoplasm of breast: Secondary | ICD-10-CM

## 2021-04-07 ENCOUNTER — Ambulatory Visit: Payer: Self-pay | Admitting: *Deleted

## 2021-04-07 NOTE — Telephone Encounter (Signed)
Reason for Disposition  [1] COVID-19 diagnosed by positive lab test (e.g., PCR, rapid self-test kit) AND [2] mild symptoms (e.g., cough, fever, others) AND [3] no complications or SOB  Answer Assessment - Initial Assessment Questions 1. COVID-19 DIAGNOSIS: "Who made your COVID-19 diagnosis?" "Was it confirmed by a positive lab test or self-test?" If not diagnosed by a doctor (or NP/PA), ask "Are there lots of cases (community spread) where you live?" Note: See public health department website, if unsure.     Covid positive.   I have high BP what should I do? 2. COVID-19 EXPOSURE: "Was there any known exposure to COVID before the symptoms began?" CDC Definition of close contact: within 6 feet (2 meters) for a total of 15 minutes or more over a 24-hour period.      Not asked 3. ONSET: "When did the COVID-19 symptoms start?"      Yesterday Runny nose, fever, not feeling good.  Taste is different, no vomiting or diarrhea.   Have a scratchy throat, mild coughing, body aches.  No headaches.   My husband is positive too.   They put him on the medication for Covid because he has diabetes.   I only have high BP.   Do I need to take the Covid medicine?    I let her know I would check with the dr and see but for most people without high risk factors the virus runs its course over a week or 2 without medication except for managing the symptoms like coughing, fever, etc. 4. WORST SYMPTOM: "What is your worst symptom?" (e.g., cough, fever, shortness of breath, muscle aches)     Not asked 5. COUGH: "Do you have a cough?" If Yes, ask: "How bad is the cough?"       A very mild one more like a tickle in my throat. 6. FEVER: "Do you have a fever?" If Yes, ask: "What is your temperature, how was it measured, and when did it start?"     I get chills and hot sometimes on and off. 7. RESPIRATORY STATUS: "Describe your breathing?" (e.g., shortness of breath, wheezing, unable to speak)      No shortness of breath 8.  BETTER-SAME-WORSE: "Are you getting better, staying the same or getting worse compared to yesterday?"  If getting worse, ask, "In what way?"     Same as yeterday 9. HIGH RISK DISEASE: "Do you have any chronic medical problems?" (e.g., asthma, heart or lung disease, weak immune system, obesity, etc.)     Just hypertension. 10. VACCINE: "Have you had the COVID-19 vaccine?" If Yes, ask: "Which one, how many shots, when did you get it?"       Not asked 11. BOOSTER: "Have you received your COVID-19 booster?" If Yes, ask: "Which one and when did you get it?"       Not asked 12. PREGNANCY: "Is there any chance you are pregnant?" "When was your last menstrual period?"       Not asked 13. OTHER SYMPTOMS: "Do you have any other symptoms?"  (e.g., chills, fatigue, headache, loss of smell or taste, muscle pain, sore throat)       See above. 14. O2 SATURATION MONITOR:  "Do you use an oxygen saturation monitor (pulse oximeter) at home?" If Yes, ask "What is your reading (oxygen level) today?" "What is your usual oxygen saturation reading?" (e.g., 95%)       No  Protocols used: Coronavirus (COVID-19) Diagnosed or Suspected-A-AH

## 2021-04-07 NOTE — Telephone Encounter (Signed)
Pt called in c/o being positive for covid and wanted to know if she needed to take an antiviral.   See notes.    I sent my notes to [facility] for the dr to review however pt said she would rather not take anything since she is not high risk or having bad symptoms. Notes sent to [facility].

## 2021-04-09 ENCOUNTER — Telehealth: Payer: Self-pay | Admitting: Critical Care Medicine

## 2021-04-09 NOTE — Telephone Encounter (Signed)
Called pt and she is rapidly improving  I gave her recommendations on supportive care.   Oral antiviral not needed

## 2021-04-11 ENCOUNTER — Encounter (INDEPENDENT_AMBULATORY_CARE_PROVIDER_SITE_OTHER): Payer: Self-pay

## 2021-04-24 ENCOUNTER — Ambulatory Visit: Payer: 59 | Admitting: Critical Care Medicine

## 2021-06-19 NOTE — Progress Notes (Signed)
Established Patient Office Visit  Subjective:  Patient ID: Colleen Terrell, female    DOB: [birth year]  Age: 40 y.o. MRN: [id number]  CC:  Chief Complaint  Patient presents with   Knee Pain    HPI Colleen Terrell presents for primary care follow-up visit.  Patient does understand [place] and is originally from [place].  Patient has been complaining of progressive knee pain right greater than left and she has seen orthopedics in the past.  She has increased fluid on the right knee with minimal range of motion.  She has lost some weight.  She eats a culturally appropriate diet for her [place] country.  She is eating quite a bit of corn and white rice in her diet.  She is lost 15 pounds of weight.  She recently developed over the last week a sneeze low-grade temp and sore throat and was COVID-negative at home.  Note on arrival we did have her tested for strep and flu and both were negative.  Patient has no other real complaints at this time.  Patient wishes to defer her flu vaccine until she recovers from this current condition. Past Medical History:  Diagnosis Date   Complication of anesthesia    pain with surgery   GERD (gastroesophageal reflux disease)    History of kidney infection during pregnancy    Hypertension    PP NVB 02/02/12 02/02/2012   Unspecified hemorrhoids without mention of complication     Past Surgical History:  Procedure Laterality Date   CESAREAN SECTION N/A 09/13/2014   Procedure: CESAREAN SECTION;  Surgeon: Levie Heritage, DO;  Location: [facility];  Service: Obstetrics;  Laterality: N/A;   CESAREAN SECTION MULTI-GESTATIONAL N/A 03/19/2018   Procedure: REPEAT CESAREAN SECTION MULTI-GESTATIONAL;  Surgeon: Federico Flake, MD;  Location: [facility] BIRTHING SUIT[facility];  Service: Obstetrics;  Laterality: N/A;    Family History  Problem Relation Age of Onset   Hypertension Mother    Hypertension Father    Anesthesia problems Neg Hx    Breast cancer Neg Hx      Social History   Socioeconomic History   Marital status: Married    Spouse name: Not on file   Number of children: Not on file   Years of education: Not on file   Highest education level: Not on file  Occupational History   Not on file  Tobacco Use   Smoking status: Never   Smokeless tobacco: Never  Substance and Sexual Activity   Alcohol use: No   Drug use: No   Sexual activity: Not Currently    Birth control/protection: None  Other Topics Concern   Not on file  Social History Narrative   Not on file   Social Determinants of Health   Financial Resource Strain: Not on file  Food Insecurity: Not on file  Transportation Needs: Not on file  Physical Activity: Not on file  Stress: Not on file  Social Connections: Not on file  Intimate Partner Violence: Not on file    Outpatient Medications Prior to Visit  Medication Sig Dispense Refill   amLODipine (NORVASC) 10 MG tablet Take 1 tablet (10 mg total) by mouth daily. 90 tablet 3   diclofenac (VOLTAREN) 75 MG EC tablet Take 1 tablet (75 mg total) by mouth 2 (two) times daily as needed. 60 tablet 0   ibuprofen (ADVIL) 600 MG tablet Take 1 tablet (600 mg total) by mouth every 8 (eight) hours as needed for moderate pain. Take after  eating 60 tablet 1   No facility-administered medications prior to visit.    No Known Allergies  ROS Review of Systems  Constitutional:  Positive for fever.  HENT:  Positive for postnasal drip, rhinorrhea and sore throat. Negative for ear pain, sinus pressure, trouble swallowing and voice change.   Eyes: Negative.   Respiratory: Negative.  Negative for apnea, cough, choking, chest tightness, shortness of breath, wheezing and stridor.   Cardiovascular: Negative.  Negative for chest pain, palpitations and leg swelling.  Gastrointestinal: Negative.  Negative for abdominal distention, abdominal pain, nausea and vomiting.  Genitourinary: Negative.   Musculoskeletal:  Positive for arthralgias.  Negative for myalgias.       Knee pain R>L   Skin: Negative.  Negative for rash.  Allergic/Immunologic: Negative.  Negative for environmental allergies and food allergies.  Neurological: Negative.  Negative for dizziness, syncope, weakness and headaches.  Hematological: Negative.  Negative for adenopathy. Does not bruise/bleed easily.  Psychiatric/Behavioral: Negative.  Negative for agitation and sleep disturbance. The patient is not nervous/anxious.      Objective:    Physical Exam Vitals reviewed.  Constitutional:      Appearance: Normal appearance. She is well-developed. She is not diaphoretic.  HENT:     Head: Normocephalic and atraumatic.     Nose: No nasal deformity, septal deviation, mucosal edema or rhinorrhea.     Right Sinus: No maxillary sinus tenderness or frontal sinus tenderness.     Left Sinus: No maxillary sinus tenderness or frontal sinus tenderness.     Mouth/Throat:     Pharynx: No oropharyngeal exudate.     Comments: Mild erythema without purulence in the throat Eyes:     General: No scleral icterus.    Conjunctiva/sclera: Conjunctivae normal.     Pupils: Pupils are equal, round, and reactive to light.  Neck:     Thyroid: No thyromegaly.     Vascular: No carotid bruit or JVD.     Trachea: Trachea normal. No tracheal tenderness or tracheal deviation.  Cardiovascular:     Rate and Rhythm: Normal rate and regular rhythm.     Chest Wall: PMI is not displaced.     Pulses: Normal pulses. No decreased pulses.     Heart sounds: Normal heart sounds, S1 normal and S2 normal. Heart sounds not distant. No murmur heard. No systolic murmur is present.  No diastolic murmur is present.    No friction rub. No gallop. No S3 or S4 sounds.  Pulmonary:     Effort: No tachypnea, accessory muscle usage or respiratory distress.     Breath sounds: No stridor. No decreased breath sounds, wheezing, rhonchi or rales.  Chest:     Chest wall: No tenderness.  Abdominal:     General:  Bowel sounds are normal. There is no distension.     Palpations: Abdomen is soft. Abdomen is not rigid.     Tenderness: There is no abdominal tenderness. There is no guarding or rebound.  Musculoskeletal:        General: Swelling present. Normal range of motion.     Cervical back: Normal range of motion and neck supple. No edema, erythema or rigidity. No muscular tenderness. Normal range of motion.     Comments: Decreased range of motion right knee evidence of effusion seen in the right knee  Left knee with chondromalacia findings on the patellar  Lymphadenopathy:     Head:     Right side of head: No submental or submandibular adenopathy.  Left side of head: No submental or submandibular adenopathy.     Cervical: No cervical adenopathy.  Skin:    General: Skin is warm and dry.     Coloration: Skin is not pale.     Findings: No rash.     Nails: There is no clubbing.  Neurological:     Mental Status: She is alert and oriented to person, place, and time.     Sensory: No sensory deficit.  Psychiatric:        Mood and Affect: Mood normal.        Speech: Speech normal.        Behavior: Behavior normal.        Thought Content: Thought content normal.    BP 136/84   Pulse 96   Resp 16   Wt 265 lb 9.6 oz (120.5 kg)   SpO2 100%   BMI 39.22 kg/m  Wt Readings from Last 3 Encounters:  06/20/21 265 lb 9.6 oz (120.5 kg)  01/19/21 269 lb 12.8 oz (122.4 kg)  10/24/20 273 lb (123.8 kg)     Health Maintenance Due  Topic Date Due   Pneumococcal Vaccine 58-41 Years old (1 - PCV) Never done   INFLUENZA VACCINE  03/27/2021   COVID-19 Vaccine (4 - Booster for Pfizer series) 03/28/2021    There are no preventive care reminders to display for this patient.  No results found for: TSH Lab Results  Component Value Date   WBC 9.2 06/23/2020   HGB 11.5 06/23/2020   HCT 36.8 06/23/2020   MCV 84 06/23/2020   PLT 336 06/23/2020   Lab Results  Component Value Date   NA 139 06/23/2020    K 3.8 06/23/2020   CO2 23 06/23/2020   GLUCOSE 156 (H) 06/23/2020   BUN 12 06/23/2020   CREATININE 0.96 06/23/2020   BILITOT 0.5 03/29/2018   ALKPHOS 83 03/29/2018   AST 27 03/29/2018   ALT 37 03/29/2018   PROT 6.3 (L) 03/29/2018   ALBUMIN 3.0 (L) 03/29/2018   CALCIUM 9.1 06/23/2020   ANIONGAP 10 03/29/2018   Lab Results  Component Value Date   CHOL 137 07/31/2018   Lab Results  Component Value Date   HDL 36 (L) 07/31/2018   Lab Results  Component Value Date   LDLCALC 86 07/31/2018   Lab Results  Component Value Date   TRIG 76 07/31/2018   Lab Results  Component Value Date   CHOLHDL 3.8 07/31/2018   Lab Results  Component Value Date   HGBA1C 5.9 (H) 09/30/2019      Assessment & Plan:   Problem List Items Addressed This Visit       Cardiovascular and Mediastinum   Chronic hypertension    Hypertension well controlled at this time no change in medications      Relevant Medications   amLODipine (NORVASC) 10 MG tablet     Respiratory   Pharyngitis - Primary    Pharyngitis appears to be viral with negative strep and flu screen we will treat symptomatically and await resolution to give flu vaccine        Other   Chronic pain of right knee    Worsening right knee pain will refer back to orthopedics and prescribe meloxicam 15 mg daily      Relevant Orders   Ambulatory referral to Orthopedic Surgery   Morbid obesity (HCC)    Dietary recommendations given      Relevant Medications   amLODipine (NORVASC) 10 MG  tablet    Meds ordered this encounter  Medications   amLODipine (NORVASC) 10 MG tablet    Sig: Take 1 tablet (10 mg total) by mouth daily.    Dispense:  90 tablet    Refill:  3   meloxicam (MOBIC) 15 MG tablet    Sig: Take 1 tablet (15 mg total) by mouth daily.    Dispense:  30 tablet    Refill:  3    Follow-up: Return in about 4 months (around 10/21/2021).    Shan Levans, MD

## 2021-06-20 ENCOUNTER — Other Ambulatory Visit: Payer: Self-pay

## 2021-06-20 ENCOUNTER — Encounter: Payer: Self-pay | Admitting: Critical Care Medicine

## 2021-06-20 ENCOUNTER — Ambulatory Visit: Payer: 59 | Attending: Critical Care Medicine | Admitting: Critical Care Medicine

## 2021-06-20 VITALS — BP 136/84 | HR 96 | Resp 16 | Wt 265.6 lb

## 2021-06-20 DIAGNOSIS — J029 Acute pharyngitis, unspecified: Secondary | ICD-10-CM | POA: Insufficient documentation

## 2021-06-20 DIAGNOSIS — G8929 Other chronic pain: Secondary | ICD-10-CM | POA: Diagnosis not present

## 2021-06-20 DIAGNOSIS — M25561 Pain in right knee: Secondary | ICD-10-CM

## 2021-06-20 DIAGNOSIS — I10 Essential (primary) hypertension: Secondary | ICD-10-CM | POA: Diagnosis not present

## 2021-06-20 DIAGNOSIS — Z6839 Body mass index (BMI) 39.0-39.9, adult: Secondary | ICD-10-CM

## 2021-06-20 MED ORDER — MELOXICAM 15 MG PO TABS: 15.0000 mg | ORAL_TABLET | Freq: Every day | ORAL | 3 refills | Status: AC

## 2021-06-20 MED ORDER — AMLODIPINE BESYLATE 10 MG PO TABS: 10.0000 mg | ORAL_TABLET | Freq: Every day | ORAL | 3 refills | Status: AC

## 2021-06-20 NOTE — Assessment & Plan Note (Signed)
Hypertension well controlled at this time no change in medications

## 2021-06-20 NOTE — Assessment & Plan Note (Signed)
Pharyngitis appears to be viral with negative strep and flu screen we will treat symptomatically and await resolution to give flu vaccine

## 2021-06-20 NOTE — Assessment & Plan Note (Signed)
Worsening right knee pain will refer back to orthopedics and prescribe meloxicam 15 mg daily

## 2021-06-20 NOTE — Patient Instructions (Addendum)
Refill on your amlodipine sent to your pharmacy  Trial of meloxicam 15 mg daily for knee pain and resume your knee exercises and continue to work on weight loss  Referral to orthopedics was made  Return in 2 weeks for flu vaccine and pneumonia vaccine PCV 20 nurse visit  Return to see Dr. Delford Field 4 months   Flu and strep test were negative

## 2021-06-20 NOTE — Assessment & Plan Note (Signed)
Dietary recommendations given 

## 2021-06-27 ENCOUNTER — Ambulatory Visit: Payer: 59 | Admitting: Orthopaedic Surgery

## 2021-07-04 ENCOUNTER — Ambulatory Visit: Payer: 59

## 2021-07-05 ENCOUNTER — Ambulatory Visit: Payer: 59

## 2021-07-14 ENCOUNTER — Ambulatory Visit: Payer: Self-pay

## 2021-07-14 ENCOUNTER — Ambulatory Visit (INDEPENDENT_AMBULATORY_CARE_PROVIDER_SITE_OTHER): Payer: 59

## 2021-07-14 ENCOUNTER — Ambulatory Visit (INDEPENDENT_AMBULATORY_CARE_PROVIDER_SITE_OTHER): Payer: 59 | Admitting: Orthopaedic Surgery

## 2021-07-14 ENCOUNTER — Encounter: Payer: Self-pay | Admitting: Orthopaedic Surgery

## 2021-07-14 ENCOUNTER — Other Ambulatory Visit: Payer: Self-pay

## 2021-07-14 DIAGNOSIS — M25562 Pain in left knee: Secondary | ICD-10-CM | POA: Diagnosis not present

## 2021-07-14 DIAGNOSIS — G8929 Other chronic pain: Secondary | ICD-10-CM

## 2021-07-14 DIAGNOSIS — M25561 Pain in right knee: Secondary | ICD-10-CM

## 2021-07-14 MED ORDER — MELOXICAM 7.5 MG PO TABS: 7.5000 mg | ORAL_TABLET | Freq: Two times a day (BID) | ORAL | 2 refills | Status: AC | PRN

## 2021-07-14 NOTE — Progress Notes (Signed)
Office Visit Note   Patient: Colleen Terrell           Date of Birth: [birth year]           MRN: [id number] Visit Date: 07/14/2021              Requested by: Storm Frisk, MD [address] [place],  [place] [address] PCP: Storm Frisk, MD   Assessment & Plan: Visit Diagnoses:  1. Chronic pain of both knees     Plan: Impression is bilateral knee osteoarthritis with some moderate degenerative changes.  Based on her options we will start with outpatient physical therapy, weight loss, prescription for meloxicam.  She will follow-up if she decides to undergo cortisone injections.  Follow-Up Instructions: No follow-ups on file.   Orders:  Orders Placed This Encounter  Procedures   XR KNEE 3 VIEW LEFT   XR KNEE 3 VIEW RIGHT   Ambulatory referral to Physical Therapy   Meds ordered this encounter  Medications   meloxicam (MOBIC) 7.5 MG tablet    Sig: Take 1 tablet (7.5 mg total) by mouth 2 (two) times daily as needed for pain.    Dispense:  30 tablet    Refill:  2      Procedures: No procedures performed   Clinical Data: No additional findings.   Subjective: Chief Complaint  Patient presents with   Left Knee - Pain   Right Knee - Pain    Patient is a 40 year old female comes in for evaluation of bilateral knee pain for about 2 years.  Denies any recent injuries.  She feels medial sided knee pain on the left and she feels like the knees are swollen.  She has trouble kneeling down to pray.  She also has trouble standing at work.  Denies any mechanical symptoms.   Review of Systems  Constitutional: Negative.   HENT: Negative.    Eyes: Negative.   Respiratory: Negative.    Cardiovascular: Negative.   Endocrine: Negative.   Musculoskeletal: Negative.   Neurological: Negative.   Hematological: Negative.   Psychiatric/Behavioral: Negative.    All other systems reviewed and are negative.   Objective: Vital Signs: There were no vitals taken for  this visit.  Physical Exam Vitals and nursing note reviewed.  Constitutional:      Appearance: She is well-developed.  Pulmonary:     Effort: Pulmonary effort is normal.  Skin:    General: Skin is warm.     Capillary Refill: Capillary refill takes less than 2 seconds.  Neurological:     Mental Status: She is alert and oriented to person, place, and time.  Psychiatric:        Behavior: Behavior normal.        Thought Content: Thought content normal.        Judgment: Judgment normal.    Ortho Exam  Bilateral knees show small effusions.  1+ patellofemoral crepitus with range of motion.  Collaterals and cruciates are stable.  No significant joint line tenderness.  Specialty Comments:  No specialty comments available.  Imaging: XR KNEE 3 VIEW LEFT  Result Date: 07/14/2021 Moderate degenerative joint disease with periarticular spurring.  XR KNEE 3 VIEW RIGHT  Result Date: 07/14/2021 Moderate degenerative joint disease with periarticular spurring.    PMFS History: Patient Active Problem List   Diagnosis Date Noted   Pharyngitis 06/20/2021   Chronic pain of right knee 10/24/2020   Frequent falls 10/24/2020   Morbid obesity (HCC) 10/24/2020  Encounter for surveillance of injectable contraceptive 04/24/2018   Chronic hypertension 03/27/2018   S/P cesarean section 03/19/2018   Past Medical History:  Diagnosis Date   Complication of anesthesia    pain with surgery   GERD (gastroesophageal reflux disease)    History of kidney infection during pregnancy    Hypertension    PP NVB 02/02/12 02/02/2012   Unspecified hemorrhoids without mention of complication     Family History  Problem Relation Age of Onset   Hypertension Mother    Hypertension Father    Anesthesia problems Neg Hx    Breast cancer Neg Hx     Past Surgical History:  Procedure Laterality Date   CESAREAN SECTION N/A 09/13/2014   Procedure: CESAREAN SECTION;  Surgeon: Levie Heritage, DO;  Location: [facility];  Service: Obstetrics;  Laterality: N/A;   CESAREAN SECTION MULTI-GESTATIONAL N/A 03/19/2018   Procedure: REPEAT CESAREAN SECTION MULTI-GESTATIONAL;  Surgeon: Federico Flake, MD;  Location: [facility]TH[facility];  Service: Obstetrics;  Laterality: N/A;   Social History   Occupational History   Not on file  Tobacco Use   Smoking status: Never   Smokeless tobacco: Never  Substance and Sexual Activity   Alcohol use: No   Drug use: No   Sexual activity: Not Currently    Birth control/protection: None

## 2021-09-11 ENCOUNTER — Ambulatory Visit: Payer: Self-pay | Admitting: *Deleted

## 2021-09-11 NOTE — Telephone Encounter (Signed)
Answer Assessment - Initial Assessment Questions 1. BLOOD PRESSURE: "What is the blood pressure?" "Did you take at least two measurements 5 minutes apart?"     Pt called in c/o elevated BP.   Line disconnected before agent got her transferred to me.   I called her back but the mail  box is full and not accepting messages at this time.   Agent let me know her BP was 178/107 over the weekend.    2. ONSET: "When did you take your blood pressure?"     *No Answer* 3. HOW: "How did you obtain the blood pressure?" (e.g., visiting nurse, automatic home BP monitor)     *No Answer* 4. HISTORY: "Do you have a history of high blood pressure?"     *No Answer* 5. MEDICATIONS: "Are you taking any medications for blood pressure?" "Have you missed any doses recently?"     *No Answer* 6. OTHER SYMPTOMS: "Do you have any symptoms?" (e.g., headache, chest pain, blurred vision, difficulty breathing, weakness)     *No Answer* 7. PREGNANCY: "Is there any chance you are pregnant?" "When was your last menstrual period?"     *No Answer*  Protocols used: Blood Pressure - High-A-AH

## 2021-09-11 NOTE — Telephone Encounter (Signed)
°  Chief Complaint: elevated BP requesting medication  Symptoms: BP this am 159/104 P 80 rechecked for BP 158/103 P 82. Feels "weakness" but not on either side weak in general  Frequency: sice Saturday Pertinent Negatives: Patient denies chest pain difficulty breathing, no blurred vision, no speech issues, no issues with balance, no weakness on either side . Disposition: [] ED /[x] Urgent Care (no appt availability in office) / [] Appointment(In office/virtual)/ []  [facility]/ [] Home Care/ [] Refused Recommended Disposition /[] [facility]/ []  Follow-up with PCP Additional Notes:  No available appt per FC. Advise to increase water intake and recheck BP today . Go to UC if worsening symptoms.         Reason for Disposition  Systolic BP  >= 0000000 OR Diastolic >= [id number]    Symptom of feeling weak in general not on one side  Answer Assessment - Initial Assessment Questions 1. BLOOD PRESSURE: "What is the blood pressure?" "Did you take at least two measurements 5 minutes apart?"     0931 BP 159/104 P 80 rechecked for BP 158/103 P 82 2. ONSET: "When did you take your blood pressure?"     Now  3. HOW: "How did you obtain the blood pressure?" (e.g., visiting nurse, automatic home BP monitor)     Automatic BP monitor  4. HISTORY: "Do you have a history of high blood pressure?"     Yes 5. MEDICATIONS: "Are you taking any medications for blood pressure?" "Have you missed any doses recently?"     Took amlodipine this am  6. OTHER SYMPTOMS: "Do you have any symptoms?" (e.g., headache, chest pain, blurred vision, difficulty breathing, weakness)     Weakness, feel "not feeling right" 7. PREGNANCY: "Is there any chance you are pregnant?" "When was your last menstrual period?"     na  Protocols used: Blood Pressure - High-A-AH

## 2021-09-11 NOTE — Telephone Encounter (Signed)
Pt called in but she either hung up or the line disconnected.   I attempted to call her back but her mail box is full and is not accepting messages at this time.

## 2021-09-13 NOTE — Telephone Encounter (Signed)
Fyi high blood pressure, would like me to schedule an ov for next week ?

## 2021-09-14 NOTE — Telephone Encounter (Signed)
Called pt and mailbox was full unable to leave message about scheduling.

## 2021-09-14 NOTE — Telephone Encounter (Signed)
Yes lets work her in next week

## 2021-09-15 ENCOUNTER — Other Ambulatory Visit: Payer: Self-pay

## 2021-09-15 ENCOUNTER — Ambulatory Visit (INDEPENDENT_AMBULATORY_CARE_PROVIDER_SITE_OTHER): Payer: 59 | Admitting: Orthopaedic Surgery

## 2021-09-15 DIAGNOSIS — M170 Bilateral primary osteoarthritis of knee: Secondary | ICD-10-CM

## 2021-09-15 MED ORDER — DICLOFENAC SODIUM 75 MG PO TBEC: 75.0000 mg | DELAYED_RELEASE_TABLET | Freq: Two times a day (BID) | ORAL | 2 refills | Status: AC | PRN

## 2021-09-15 NOTE — Progress Notes (Signed)
Office Visit Note   Patient: Colleen Terrell           Date of Birth: 1981-01-07           MRN: [id number] Visit Date: 09/15/2021              Requested by: Storm Frisk, MD [address] [place],  [place] [address] PCP: Storm Frisk, MD   Assessment & Plan: Visit Diagnoses:  1. Bilateral primary osteoarthritis of knee     Plan: Impression is advanced degenerative joint disease bilateral knees left greater than right.  Today, we discussed various treatment options to include cortisone injection, oral anti-inflammatories and physical therapy.  She would like to proceed with cortisone injection to both knees today.  She would also like to try different anti-inflammatory and a course of physical therapy.  Internal referral has been made.  Follow-up with [place] as needed.  Follow-Up Instructions: Return if symptoms worsen or fail to improve.   Orders:  Orders Placed This Encounter  Procedures   Large Joint Inj: bilateral knee   No orders of the defined types were placed in this encounter.     Procedures: Large Joint Inj: bilateral knee on 09/15/2021 9:29 AM Indications: pain Details: 22 G needle, anterolateral approach Medications (Right): 0.66 mL bupivacaine 0.25 %; 3 mL lidocaine 1 %; 13.33 mg methylPREDNISolone acetate 40 MG/ML Medications (Left): 0.66 mL bupivacaine 0.25 %; 3 mL lidocaine 1 %; 13.33 mg methylPREDNISolone acetate 40 MG/ML     Clinical Data: No additional findings.   Subjective: Chief Complaint  Patient presents with   Right Knee - Pain   Left Knee - Pain    HPI patient is a pleasant 41 year old female who comes in today with recurrent bilateral knee pain and swelling left greater than right.  The pain she has to the entire aspect of both knees.  Pain and swelling appears to be worse with cooking, working as well as first thing in the morning when getting out of bed.  She has been taking meloxicam which minimally helps.  She does note  she gets relief when she is fasting.  She did have a previous cortisone injection to the right knee years ago which provided relief.  No previous cortisone injection to the left knee.  Review of Systems as detailed in HPI.  All others reviewed and are negative.   Objective: Vital Signs: There were no vitals taken for this visit.  Physical Exam well-developed well-nourished female no acute distress.  Alert and oriented x3.  Ortho Exam bilateral knee exam shows small effusions.  Range of motion 0 to 110 degrees.  Medial joint line tenderness.  She is stable to valgus varus stress.  She is neurovascular intact distally.  Specialty Comments:  No specialty comments available.  Imaging: No new imaging   PMFS History: Patient Active Problem List   Diagnosis Date Noted   Pharyngitis 06/20/2021   Chronic pain of right knee 10/24/2020   Frequent falls 10/24/2020   Morbid obesity (HCC) 10/24/2020   Encounter for surveillance of injectable contraceptive 04/24/2018   Chronic hypertension 03/27/2018   S/P cesarean section 03/19/2018   Past Medical History:  Diagnosis Date   Complication of anesthesia    pain with surgery   GERD (gastroesophageal reflux disease)    History of kidney infection during pregnancy    Hypertension    PP NVB 02/02/12 02/02/2012   Unspecified hemorrhoids without mention of complication     Family  History  Problem Relation Age of Onset   Hypertension Mother    Hypertension Father    Anesthesia problems Neg Hx    Breast cancer Neg Hx     Past Surgical History:  Procedure Laterality Date   CESAREAN SECTION N/A 09/13/2014   Procedure: CESAREAN SECTION;  Surgeon: Levie Heritage, DO;  Location: [facility];  Service: Obstetrics;  Laterality: N/A;   CESAREAN SECTION MULTI-GESTATIONAL N/A 03/19/2018   Procedure: REPEAT CESAREAN SECTION MULTI-GESTATIONAL;  Surgeon: Federico Flake, MD;  Location: [facility]IRTH[facility] SU[facility];  Service: Obstetrics;  Laterality: N/A;   Social  History   Occupational History   Not on file  Tobacco Use   Smoking status: Never   Smokeless tobacco: Never  Substance and Sexual Activity   Alcohol use: No   Drug use: No   Sexual activity: Not Currently    Birth control/protection: None

## 2021-09-16 MED ORDER — METHYLPREDNISOLONE ACETATE 40 MG/ML IJ SUSP
13.3300 mg | INTRAMUSCULAR | Status: AC | PRN
  Administered 2021-09-15: 13.33 mg via INTRA_ARTICULAR

## 2021-09-16 MED ORDER — BUPIVACAINE HCL 0.25 % IJ SOLN
0.6600 mL | INTRAMUSCULAR | Status: AC | PRN
  Administered 2021-09-15: .66 mL via INTRA_ARTICULAR

## 2021-09-16 MED ORDER — LIDOCAINE HCL 1 % IJ SOLN
3.0000 mL | INTRAMUSCULAR | Status: AC | PRN
  Administered 2021-09-15: 3 mL
